# Patient Record
Sex: Female | Born: 1970 | Race: White | Hispanic: No | Marital: Married | Smoking: Former smoker
Health system: Southern US, Community
[De-identification: ages and names within clinical notes are randomized; demographics above are authoritative.]

## PROBLEM LIST (undated history)

## (undated) DIAGNOSIS — R011 Cardiac murmur, unspecified: Secondary | ICD-10-CM

## (undated) DIAGNOSIS — S8290XA Unspecified fracture of unspecified lower leg, initial encounter for closed fracture: Secondary | ICD-10-CM

## (undated) DIAGNOSIS — K458 Other specified abdominal hernia without obstruction or gangrene: Secondary | ICD-10-CM

## (undated) DIAGNOSIS — F4310 Post-traumatic stress disorder, unspecified: Secondary | ICD-10-CM

## (undated) DIAGNOSIS — K7460 Unspecified cirrhosis of liver: Secondary | ICD-10-CM

## (undated) DIAGNOSIS — K219 Gastro-esophageal reflux disease without esophagitis: Secondary | ICD-10-CM

## (undated) DIAGNOSIS — F319 Bipolar disorder, unspecified: Secondary | ICD-10-CM

## (undated) DIAGNOSIS — M419 Scoliosis, unspecified: Secondary | ICD-10-CM

## (undated) DIAGNOSIS — N189 Chronic kidney disease, unspecified: Secondary | ICD-10-CM

## (undated) DIAGNOSIS — B1910 Unspecified viral hepatitis B without hepatic coma: Secondary | ICD-10-CM

## (undated) DIAGNOSIS — F329 Major depressive disorder, single episode, unspecified: Secondary | ICD-10-CM

## (undated) DIAGNOSIS — E039 Hypothyroidism, unspecified: Secondary | ICD-10-CM

## (undated) DIAGNOSIS — K469 Unspecified abdominal hernia without obstruction or gangrene: Secondary | ICD-10-CM

## (undated) DIAGNOSIS — C801 Malignant (primary) neoplasm, unspecified: Secondary | ICD-10-CM

## (undated) DIAGNOSIS — J449 Chronic obstructive pulmonary disease, unspecified: Secondary | ICD-10-CM

## (undated) DIAGNOSIS — J45909 Unspecified asthma, uncomplicated: Secondary | ICD-10-CM

## (undated) DIAGNOSIS — Z8614 Personal history of Methicillin resistant Staphylococcus aureus infection: Secondary | ICD-10-CM

## (undated) DIAGNOSIS — J189 Pneumonia, unspecified organism: Secondary | ICD-10-CM

## (undated) DIAGNOSIS — F4001 Agoraphobia with panic disorder: Secondary | ICD-10-CM

## (undated) DIAGNOSIS — M797 Fibromyalgia: Secondary | ICD-10-CM

## (undated) DIAGNOSIS — F32A Depression, unspecified: Secondary | ICD-10-CM

## (undated) HISTORY — PX: FRACTURE SURGERY: SHX138

## (undated) HISTORY — DX: Unspecified asthma, uncomplicated: J45.909

## (undated) HISTORY — DX: Chronic obstructive pulmonary disease, unspecified: J44.9

## (undated) HISTORY — DX: Depression, unspecified: F32.A

## (undated) HISTORY — DX: Gastro-esophageal reflux disease without esophagitis: K21.9

## (undated) HISTORY — DX: Scoliosis, unspecified: M41.9

## (undated) HISTORY — DX: Major depressive disorder, single episode, unspecified: F32.9

## (undated) HISTORY — DX: Unspecified viral hepatitis B without hepatic coma: B19.10

## (undated) HISTORY — PX: HERNIA REPAIR: SHX51

## (undated) HISTORY — DX: Fibromyalgia: M79.7

## (undated) HISTORY — DX: Agoraphobia with panic disorder: F40.01

---

## 1997-10-03 HISTORY — PX: TUBAL LIGATION: SHX77

## 1997-10-03 HISTORY — PX: CHOLECYSTECTOMY: SHX55

## 1997-10-03 HISTORY — PX: APPENDECTOMY: SHX54

## 2004-11-05 ENCOUNTER — Emergency Department: Payer: Self-pay | Admitting: Emergency Medicine

## 2005-02-27 ENCOUNTER — Emergency Department: Payer: Self-pay | Admitting: Emergency Medicine

## 2005-04-01 ENCOUNTER — Other Ambulatory Visit: Payer: Self-pay

## 2005-04-01 ENCOUNTER — Emergency Department: Payer: Self-pay | Admitting: Emergency Medicine

## 2005-07-05 ENCOUNTER — Emergency Department: Payer: Self-pay | Admitting: Emergency Medicine

## 2005-09-21 ENCOUNTER — Emergency Department: Payer: Self-pay | Admitting: Emergency Medicine

## 2009-07-20 ENCOUNTER — Emergency Department: Payer: Self-pay | Admitting: Emergency Medicine

## 2009-10-28 ENCOUNTER — Emergency Department: Payer: Self-pay | Admitting: Emergency Medicine

## 2009-10-30 ENCOUNTER — Emergency Department (HOSPITAL_COMMUNITY): Admission: EM | Admit: 2009-10-30 | Discharge: 2009-10-31 | Payer: Self-pay | Admitting: Emergency Medicine

## 2010-08-05 ENCOUNTER — Emergency Department: Payer: Self-pay | Admitting: Emergency Medicine

## 2010-12-20 LAB — URINALYSIS, ROUTINE W REFLEX MICROSCOPIC
Glucose, UA: NEGATIVE mg/dL
Hgb urine dipstick: NEGATIVE
Ketones, ur: 15 mg/dL — AB
Nitrite: NEGATIVE
Protein, ur: NEGATIVE mg/dL
Urobilinogen, UA: 1 mg/dL (ref 0.0–1.0)
pH: 6 (ref 5.0–8.0)

## 2010-12-20 LAB — URINE CULTURE: Colony Count: 9000

## 2010-12-20 LAB — CBC
Platelets: 204 10*3/uL (ref 150–400)
RBC: 4.42 MIL/uL (ref 3.87–5.11)
WBC: 7.8 10*3/uL (ref 4.0–10.5)

## 2010-12-20 LAB — COMPREHENSIVE METABOLIC PANEL
ALT: 92 U/L — ABNORMAL HIGH (ref 0–35)
AST: 73 U/L — ABNORMAL HIGH (ref 0–37)
CO2: 26 mEq/L (ref 19–32)
GFR calc non Af Amer: >60 mL/min (ref >60)
Glucose, Bld: 89 mg/dL (ref 70–99)
Potassium: 4 mEq/L (ref 3.5–5.1)
Sodium: 137 mEq/L (ref 135–145)
Total Bilirubin: 0.6 mg/dL (ref 0.3–1.2)
Total Protein: 7.3 g/dL (ref 6.0–8.3)

## 2010-12-20 LAB — DIFFERENTIAL
Lymphocytes Relative: 35 % (ref 12–46)
Lymphs Abs: 2.7 10*3/uL (ref 0.7–4.0)
Monocytes Relative: 5 % (ref 3–12)
Neutro Abs: 4.5 10*3/uL (ref 1.7–7.7)
Neutrophils Relative %: 58 % (ref 43–77)

## 2011-01-30 ENCOUNTER — Emergency Department: Payer: Self-pay | Admitting: Unknown Physician Specialty

## 2011-03-22 ENCOUNTER — Emergency Department: Payer: Self-pay | Admitting: Unknown Physician Specialty

## 2011-03-31 ENCOUNTER — Emergency Department: Payer: Self-pay | Admitting: Emergency Medicine

## 2011-08-01 ENCOUNTER — Emergency Department: Payer: Self-pay | Admitting: *Deleted

## 2011-10-30 ENCOUNTER — Inpatient Hospital Stay: Payer: Self-pay | Admitting: Internal Medicine

## 2011-10-30 LAB — DRUG SCREEN, URINE
Amphetamines, Ur Screen: NEGATIVE (ref ?–1000)
Benzodiazepine, Ur Scrn: POSITIVE (ref ?–200)
Cocaine Metabolite,Ur ~~LOC~~: POSITIVE (ref ?–300)
Opiate, Ur Screen: NEGATIVE (ref ?–300)
Tricyclic, Ur Screen: NEGATIVE (ref ?–1000)

## 2011-10-30 LAB — COMPREHENSIVE METABOLIC PANEL
Albumin: 3.7 g/dL (ref 3.4–5.0)
Alkaline Phosphatase: 110 U/L (ref 50–136)
Anion Gap: 12 (ref 7–16)
BUN: 10 mg/dL (ref 7–18)
Calcium, Total: 8.8 mg/dL (ref 8.5–10.1)
Glucose: 88 mg/dL (ref 65–99)
Osmolality: 285 (ref 275–301)
SGOT(AST): 90 U/L — ABNORMAL HIGH (ref 15–37)
SGPT (ALT): 76 U/L

## 2011-10-30 LAB — CBC
HCT: 38.6 % (ref 35.0–47.0)
HGB: 13 g/dL (ref 12.0–16.0)
MCH: 32.6 pg (ref 26.0–34.0)

## 2011-10-30 LAB — URINALYSIS, COMPLETE
Blood: NEGATIVE
Glucose,UR: NEGATIVE mg/dL (ref 0–75)
Leukocyte Esterase: NEGATIVE
Nitrite: NEGATIVE
Ph: 6 (ref 4.5–8.0)
Protein: NEGATIVE

## 2011-10-30 LAB — ETHANOL
Ethanol %: 0.101 % — ABNORMAL HIGH (ref 0.000–0.080)
Ethanol: 101 mg/dL

## 2011-10-30 LAB — LIPASE, BLOOD: Lipase: 95 U/L (ref 73–393)

## 2011-10-30 LAB — ACETAMINOPHEN LEVEL: Acetaminophen: 2 ug/mL — ABNORMAL LOW

## 2011-10-31 LAB — COMPREHENSIVE METABOLIC PANEL
BUN: 10 mg/dL (ref 7–18)
Bilirubin,Total: 0.7 mg/dL (ref 0.2–1.0)
Chloride: 112 mmol/L — ABNORMAL HIGH (ref 98–107)
Creatinine: 0.75 mg/dL (ref 0.60–1.30)
EGFR (African American): >60
Potassium: 3.7 mmol/L (ref 3.5–5.1)
SGPT (ALT): 60 U/L
Total Protein: 6 g/dL — ABNORMAL LOW (ref 6.4–8.2)

## 2011-10-31 LAB — CBC WITH DIFFERENTIAL/PLATELET
Basophil %: 0.2 %
Eosinophil #: 0.1 10*3/uL (ref 0.0–0.7)
Eosinophil %: 2.4 %
Lymphocyte #: 1.5 10*3/uL (ref 1.0–3.6)
MCH: 32.9 pg (ref 26.0–34.0)
MCHC: 34.1 g/dL (ref 32.0–36.0)
MCV: 97 fL (ref 80–100)
Monocyte #: 0.3 10*3/uL (ref 0.0–0.7)
Platelet: 108 10*3/uL — ABNORMAL LOW (ref 150–440)
RBC: 3.71 10*6/uL — ABNORMAL LOW (ref 3.80–5.20)

## 2013-07-09 ENCOUNTER — Emergency Department: Payer: Self-pay | Admitting: Emergency Medicine

## 2013-07-18 DIAGNOSIS — J4520 Mild intermittent asthma, uncomplicated: Secondary | ICD-10-CM | POA: Insufficient documentation

## 2013-07-18 DIAGNOSIS — L309 Dermatitis, unspecified: Secondary | ICD-10-CM | POA: Insufficient documentation

## 2013-07-18 DIAGNOSIS — M5136 Other intervertebral disc degeneration, lumbar region: Secondary | ICD-10-CM | POA: Insufficient documentation

## 2013-07-18 DIAGNOSIS — J309 Allergic rhinitis, unspecified: Secondary | ICD-10-CM | POA: Insufficient documentation

## 2013-07-18 DIAGNOSIS — K219 Gastro-esophageal reflux disease without esophagitis: Secondary | ICD-10-CM | POA: Insufficient documentation

## 2013-08-10 DIAGNOSIS — F988 Other specified behavioral and emotional disorders with onset usually occurring in childhood and adolescence: Secondary | ICD-10-CM | POA: Insufficient documentation

## 2013-08-10 DIAGNOSIS — F410 Panic disorder [episodic paroxysmal anxiety] without agoraphobia: Secondary | ICD-10-CM | POA: Insufficient documentation

## 2013-08-10 DIAGNOSIS — F319 Bipolar disorder, unspecified: Secondary | ICD-10-CM | POA: Insufficient documentation

## 2013-08-10 DIAGNOSIS — F1910 Other psychoactive substance abuse, uncomplicated: Secondary | ICD-10-CM | POA: Insufficient documentation

## 2013-08-28 DIAGNOSIS — F1910 Other psychoactive substance abuse, uncomplicated: Secondary | ICD-10-CM | POA: Insufficient documentation

## 2013-11-17 ENCOUNTER — Emergency Department: Payer: Self-pay

## 2013-11-21 ENCOUNTER — Emergency Department: Payer: Self-pay | Admitting: Emergency Medicine

## 2013-11-21 LAB — PREGNANCY, URINE: Pregnancy Test, Urine: NEGATIVE m[IU]/mL

## 2013-11-21 LAB — DRUG SCREEN, URINE
Amphetamines, Ur Screen: NEGATIVE (ref ?–1000)
Barbiturates, Ur Screen: NEGATIVE (ref ?–200)
Benzodiazepine, Ur Scrn: POSITIVE (ref ?–200)
CANNABINOID 50 NG, UR ~~LOC~~: NEGATIVE (ref ?–50)
COCAINE METABOLITE, UR ~~LOC~~: NEGATIVE (ref ?–300)
MDMA (Ecstasy)Ur Screen: NEGATIVE (ref ?–500)
Methadone, Ur Screen: NEGATIVE (ref ?–300)
OPIATE, UR SCREEN: NEGATIVE (ref ?–300)
Phencyclidine (PCP) Ur S: NEGATIVE (ref ?–25)
Tricyclic, Ur Screen: POSITIVE (ref ?–1000)

## 2013-11-26 ENCOUNTER — Emergency Department: Payer: Self-pay | Admitting: Emergency Medicine

## 2013-11-26 LAB — URINALYSIS, COMPLETE
Bilirubin,UR: NEGATIVE
Blood: NEGATIVE
Glucose,UR: NEGATIVE mg/dL (ref 0–75)
Leukocyte Esterase: NEGATIVE
Nitrite: NEGATIVE
PH: 6 (ref 4.5–8.0)
Protein: NEGATIVE
RBC,UR: 7 /HPF (ref 0–5)
Specific Gravity: 1.019 (ref 1.003–1.030)

## 2013-11-26 LAB — COMPREHENSIVE METABOLIC PANEL
ALT: 57 U/L (ref 12–78)
ANION GAP: 3 — AB (ref 7–16)
Albumin: 3.6 g/dL (ref 3.4–5.0)
Alkaline Phosphatase: 141 U/L — ABNORMAL HIGH
BILIRUBIN TOTAL: 1.2 mg/dL — AB (ref 0.2–1.0)
BUN: 12 mg/dL (ref 7–18)
CHLORIDE: 99 mmol/L (ref 98–107)
Calcium, Total: 9 mg/dL (ref 8.5–10.1)
Co2: 31 mmol/L (ref 21–32)
Creatinine: 0.78 mg/dL (ref 0.60–1.30)
EGFR (Non-African Amer.): >60
Glucose: 113 mg/dL — ABNORMAL HIGH (ref 65–99)
Osmolality: 267 (ref 275–301)
POTASSIUM: 3 mmol/L — AB (ref 3.5–5.1)
SGOT(AST): 64 U/L — ABNORMAL HIGH (ref 15–37)
Sodium: 133 mmol/L — ABNORMAL LOW (ref 136–145)
Total Protein: 7.3 g/dL (ref 6.4–8.2)

## 2013-11-26 LAB — CBC
HCT: 35.1 % (ref 35.0–47.0)
HGB: 11.8 g/dL — ABNORMAL LOW (ref 12.0–16.0)
MCH: 31.3 pg (ref 26.0–34.0)
MCHC: 33.7 g/dL (ref 32.0–36.0)
MCV: 93 fL (ref 80–100)
PLATELETS: 97 10*3/uL — AB (ref 150–440)
RBC: 3.78 10*6/uL — AB (ref 3.80–5.20)
RDW: 13.9 % (ref 11.5–14.5)
WBC: 8.5 10*3/uL (ref 3.6–11.0)

## 2013-11-26 LAB — PREGNANCY, URINE: Pregnancy Test, Urine: NEGATIVE m[IU]/mL

## 2013-11-26 LAB — LIPASE, BLOOD: LIPASE: 61 U/L — AB (ref 73–393)

## 2013-12-02 LAB — COMPREHENSIVE METABOLIC PANEL
ANION GAP: 6 — AB (ref 7–16)
Albumin: 3.1 g/dL — ABNORMAL LOW (ref 3.4–5.0)
Alkaline Phosphatase: 146 U/L — ABNORMAL HIGH
BUN: 11 mg/dL (ref 7–18)
Bilirubin,Total: 0.3 mg/dL (ref 0.2–1.0)
CALCIUM: 8.6 mg/dL (ref 8.5–10.1)
CHLORIDE: 109 mmol/L — AB (ref 98–107)
CO2: 24 mmol/L (ref 21–32)
CREATININE: 0.78 mg/dL (ref 0.60–1.30)
EGFR (Non-African Amer.): >60
Glucose: 72 mg/dL (ref 65–99)
Osmolality: 275 (ref 275–301)
Potassium: 4.1 mmol/L (ref 3.5–5.1)
SGOT(AST): 42 U/L — ABNORMAL HIGH (ref 15–37)
SGPT (ALT): 32 U/L (ref 12–78)
SODIUM: 139 mmol/L (ref 136–145)
Total Protein: 6.6 g/dL (ref 6.4–8.2)

## 2013-12-02 LAB — URINALYSIS, COMPLETE
BACTERIA: NONE SEEN
Bilirubin,UR: NEGATIVE
Blood: NEGATIVE
Glucose,UR: NEGATIVE mg/dL (ref 0–75)
KETONE: NEGATIVE
Leukocyte Esterase: NEGATIVE
NITRITE: NEGATIVE
Ph: 5 (ref 4.5–8.0)
Protein: NEGATIVE
RBC,UR: NONE SEEN /HPF (ref 0–5)
SPECIFIC GRAVITY: 1.012 (ref 1.003–1.030)
Squamous Epithelial: 1
WBC UR: <1 /HPF (ref 0–5)

## 2013-12-02 LAB — CBC
HCT: 33.1 % — AB (ref 35.0–47.0)
HGB: 11.1 g/dL — ABNORMAL LOW (ref 12.0–16.0)
MCH: 31.7 pg (ref 26.0–34.0)
MCHC: 33.6 g/dL (ref 32.0–36.0)
MCV: 94 fL (ref 80–100)
Platelet: 112 10*3/uL — ABNORMAL LOW (ref 150–440)
RBC: 3.51 10*6/uL — ABNORMAL LOW (ref 3.80–5.20)
RDW: 14.8 % — ABNORMAL HIGH (ref 11.5–14.5)
WBC: 5.2 10*3/uL (ref 3.6–11.0)

## 2013-12-02 LAB — DRUG SCREEN, URINE

## 2013-12-02 LAB — ACETAMINOPHEN LEVEL

## 2013-12-02 LAB — SALICYLATE LEVEL: Salicylates, Serum: <1.7 mg/dL

## 2013-12-02 LAB — TSH: Thyroid Stimulating Horm: 3.83 u[IU]/mL

## 2013-12-02 LAB — ETHANOL: Ethanol: <3 mg/dL

## 2013-12-03 ENCOUNTER — Inpatient Hospital Stay: Payer: Self-pay | Admitting: Psychiatry

## 2013-12-12 DIAGNOSIS — F1320 Sedative, hypnotic or anxiolytic dependence, uncomplicated: Secondary | ICD-10-CM | POA: Insufficient documentation

## 2014-03-22 DIAGNOSIS — F1110 Opioid abuse, uncomplicated: Secondary | ICD-10-CM | POA: Insufficient documentation

## 2014-04-29 ENCOUNTER — Emergency Department: Payer: Self-pay | Admitting: Emergency Medicine

## 2014-04-29 LAB — COMPREHENSIVE METABOLIC PANEL
ALK PHOS: 153 U/L — AB
ALT: 55 U/L
ANION GAP: 6 — AB (ref 7–16)
AST: 72 U/L — AB (ref 15–37)
Albumin: 3.4 g/dL (ref 3.4–5.0)
BUN: 6 mg/dL — ABNORMAL LOW (ref 7–18)
Bilirubin,Total: 0.8 mg/dL (ref 0.2–1.0)
CALCIUM: 9 mg/dL (ref 8.5–10.1)
Chloride: 109 mmol/L — ABNORMAL HIGH (ref 98–107)
Co2: 26 mmol/L (ref 21–32)
Creatinine: 0.89 mg/dL (ref 0.60–1.30)
EGFR (Non-African Amer.): >60
GLUCOSE: 78 mg/dL (ref 65–99)
OSMOLALITY: 278 (ref 275–301)
Potassium: 3.5 mmol/L (ref 3.5–5.1)
Sodium: 141 mmol/L (ref 136–145)
TOTAL PROTEIN: 8.6 g/dL — AB (ref 6.4–8.2)

## 2014-04-29 LAB — CBC
HCT: 41.1 % (ref 35.0–47.0)
HGB: 13.5 g/dL (ref 12.0–16.0)
MCH: 30.6 pg (ref 26.0–34.0)
MCHC: 32.7 g/dL (ref 32.0–36.0)
MCV: 94 fL (ref 80–100)
PLATELETS: 161 10*3/uL (ref 150–440)
RBC: 4.4 10*6/uL (ref 3.80–5.20)
RDW: 15.3 % — ABNORMAL HIGH (ref 11.5–14.5)
WBC: 5.8 10*3/uL (ref 3.6–11.0)

## 2014-04-29 LAB — ACETAMINOPHEN LEVEL

## 2014-06-17 ENCOUNTER — Ambulatory Visit: Payer: Self-pay | Admitting: Oncology

## 2014-06-17 LAB — IRON AND TIBC
IRON BIND. CAP.(TOTAL): 445 ug/dL (ref 250–450)
IRON: 67 ug/dL (ref 50–170)
Iron Saturation: 15 %
UNBOUND IRON-BIND. CAP.: 378 ug/dL

## 2014-06-17 LAB — CBC CANCER CENTER
BASOS ABS: 0 x10 3/mm (ref 0.0–0.1)
BASOS PCT: 0.6 %
EOS ABS: 0.1 x10 3/mm (ref 0.0–0.7)
EOS PCT: 1.3 %
HCT: 44.4 % (ref 35.0–47.0)
HGB: 14.9 g/dL (ref 12.0–16.0)
LYMPHS ABS: 1.9 x10 3/mm (ref 1.0–3.6)
LYMPHS PCT: 23.2 %
MCH: 31.5 pg (ref 26.0–34.0)
MCHC: 33.4 g/dL (ref 32.0–36.0)
MCV: 94 fL (ref 80–100)
MONO ABS: 0.4 x10 3/mm (ref 0.2–0.9)
Monocyte %: 4.4 %
Neutrophil #: 5.7 x10 3/mm (ref 1.4–6.5)
Neutrophil %: 70.5 %
Platelet: 116 x10 3/mm — ABNORMAL LOW (ref 150–440)
RBC: 4.72 10*6/uL (ref 3.80–5.20)
RDW: 15.2 % — ABNORMAL HIGH (ref 11.5–14.5)
WBC: 8 x10 3/mm (ref 3.6–11.0)

## 2014-06-17 LAB — FOLATE: FOLIC ACID: 29.6 ng/mL (ref 3.1–100.0)

## 2014-06-17 LAB — FERRITIN: FERRITIN (ARMC): 97 ng/mL (ref 8–388)

## 2014-06-17 LAB — LACTATE DEHYDROGENASE: LDH: 167 U/L (ref 81–246)

## 2014-07-01 ENCOUNTER — Other Ambulatory Visit: Payer: Self-pay | Admitting: Neurosurgery

## 2014-07-01 DIAGNOSIS — M549 Dorsalgia, unspecified: Secondary | ICD-10-CM

## 2014-07-03 ENCOUNTER — Ambulatory Visit: Payer: Self-pay | Admitting: Oncology

## 2014-07-03 HISTORY — PX: OTHER SURGICAL HISTORY: SHX169

## 2014-07-11 ENCOUNTER — Other Ambulatory Visit: Payer: Self-pay

## 2014-07-11 ENCOUNTER — Inpatient Hospital Stay: Admission: RE | Admit: 2014-07-11 | Payer: Self-pay | Source: Ambulatory Visit

## 2014-07-21 ENCOUNTER — Ambulatory Visit
Admission: RE | Admit: 2014-07-21 | Discharge: 2014-07-21 | Disposition: A | Discharge status: Home / Self Care | Payer: Medicaid Other | Source: Ambulatory Visit | Attending: Neurosurgery | Admitting: Neurosurgery

## 2014-07-21 DIAGNOSIS — M549 Dorsalgia, unspecified: Secondary | ICD-10-CM

## 2014-07-26 ENCOUNTER — Emergency Department: Payer: Self-pay | Admitting: Emergency Medicine

## 2014-08-03 ENCOUNTER — Ambulatory Visit: Payer: Self-pay | Admitting: Oncology

## 2014-08-09 ENCOUNTER — Emergency Department: Payer: Self-pay | Admitting: Internal Medicine

## 2014-08-09 LAB — DRUG SCREEN, URINE
Amphetamines, Ur Screen: POSITIVE (ref ?–1000)
BARBITURATES, UR SCREEN: NEGATIVE (ref ?–200)
Benzodiazepine, Ur Scrn: POSITIVE (ref ?–200)
CANNABINOID 50 NG, UR ~~LOC~~: NEGATIVE (ref ?–50)
COCAINE METABOLITE, UR ~~LOC~~: NEGATIVE (ref ?–300)
MDMA (Ecstasy)Ur Screen: NEGATIVE (ref ?–500)
METHADONE, UR SCREEN: NEGATIVE (ref ?–300)
OPIATE, UR SCREEN: NEGATIVE (ref ?–300)
Phencyclidine (PCP) Ur S: NEGATIVE (ref ?–25)
TRICYCLIC, UR SCREEN: NEGATIVE (ref ?–1000)

## 2014-08-09 LAB — URINALYSIS, COMPLETE
BILIRUBIN, UR: NEGATIVE
BLOOD: NEGATIVE
Bacteria: NONE SEEN
Glucose,UR: NEGATIVE mg/dL (ref 0–75)
Ketone: NEGATIVE
LEUKOCYTE ESTERASE: NEGATIVE
NITRITE: NEGATIVE
PH: 6 (ref 4.5–8.0)
Protein: NEGATIVE
RBC,UR: <1 /HPF (ref 0–5)
Specific Gravity: 1.005 (ref 1.003–1.030)
Squamous Epithelial: <1
WBC UR: NONE SEEN /HPF (ref 0–5)

## 2014-08-09 LAB — COMPREHENSIVE METABOLIC PANEL
ALBUMIN: 3.5 g/dL (ref 3.4–5.0)
ALK PHOS: 161 U/L — AB
Anion Gap: 8 (ref 7–16)
BILIRUBIN TOTAL: 1.3 mg/dL — AB (ref 0.2–1.0)
BUN: 13 mg/dL (ref 7–18)
CALCIUM: 8.1 mg/dL — AB (ref 8.5–10.1)
CHLORIDE: 106 mmol/L (ref 98–107)
CO2: 26 mmol/L (ref 21–32)
CREATININE: 0.87 mg/dL (ref 0.60–1.30)
Glucose: 84 mg/dL (ref 65–99)
Osmolality: 279 (ref 275–301)
POTASSIUM: 3.5 mmol/L (ref 3.5–5.1)
SGOT(AST): 73 U/L — ABNORMAL HIGH (ref 15–37)
SGPT (ALT): 65 U/L — ABNORMAL HIGH
Sodium: 140 mmol/L (ref 136–145)
Total Protein: 7.4 g/dL (ref 6.4–8.2)

## 2014-08-09 LAB — ETHANOL

## 2014-08-09 LAB — SALICYLATE LEVEL

## 2014-08-09 LAB — ACETAMINOPHEN LEVEL: Acetaminophen: <2 ug/mL

## 2014-08-11 LAB — DRUG SCREEN, URINE
Amphetamines, Ur Screen: POSITIVE (ref ?–1000)
BENZODIAZEPINE, UR SCRN: POSITIVE (ref ?–200)
Barbiturates, Ur Screen: NEGATIVE (ref ?–200)
CANNABINOID 50 NG, UR ~~LOC~~: POSITIVE (ref ?–50)
COCAINE METABOLITE, UR ~~LOC~~: NEGATIVE (ref ?–300)
MDMA (Ecstasy)Ur Screen: NEGATIVE (ref ?–500)
Methadone, Ur Screen: NEGATIVE (ref ?–300)
Opiate, Ur Screen: NEGATIVE (ref ?–300)
PHENCYCLIDINE (PCP) UR S: NEGATIVE (ref ?–25)
TRICYCLIC, UR SCREEN: POSITIVE (ref ?–1000)

## 2014-08-11 LAB — CBC
HCT: 36.6 % (ref 35.0–47.0)
HGB: 12.3 g/dL (ref 12.0–16.0)
MCH: 31.8 pg (ref 26.0–34.0)
MCHC: 33.7 g/dL (ref 32.0–36.0)
MCV: 94 fL (ref 80–100)
PLATELETS: 104 10*3/uL — AB (ref 150–440)
RBC: 3.88 10*6/uL (ref 3.80–5.20)
RDW: 14 % (ref 11.5–14.5)
WBC: 9 10*3/uL (ref 3.6–11.0)

## 2014-08-11 LAB — COMPREHENSIVE METABOLIC PANEL
ALK PHOS: 166 U/L — AB
Albumin: 3.3 g/dL — ABNORMAL LOW (ref 3.4–5.0)
Anion Gap: 8 (ref 7–16)
BILIRUBIN TOTAL: 1.1 mg/dL — AB (ref 0.2–1.0)
BUN: 16 mg/dL (ref 7–18)
Calcium, Total: 8.5 mg/dL (ref 8.5–10.1)
Chloride: 104 mmol/L (ref 98–107)
Co2: 28 mmol/L (ref 21–32)
Creatinine: 1.03 mg/dL (ref 0.60–1.30)
EGFR (African American): >60
EGFR (Non-African Amer.): >60
GLUCOSE: 105 mg/dL — AB (ref 65–99)
Potassium: 3.6 mmol/L (ref 3.5–5.1)
SGOT(AST): 74 U/L — ABNORMAL HIGH (ref 15–37)
SGPT (ALT): 60 U/L
SODIUM: 140 mmol/L (ref 136–145)
Total Protein: 7.3 g/dL (ref 6.4–8.2)

## 2014-08-11 LAB — URINALYSIS, COMPLETE
BACTERIA: NONE SEEN
Bilirubin,UR: NEGATIVE
Blood: NEGATIVE
Glucose,UR: NEGATIVE mg/dL (ref 0–75)
Ketone: NEGATIVE
LEUKOCYTE ESTERASE: NEGATIVE
NITRITE: NEGATIVE
Ph: 5 (ref 4.5–8.0)
Protein: NEGATIVE
Specific Gravity: 1.018 (ref 1.003–1.030)
Squamous Epithelial: 3
WBC UR: <1 /HPF (ref 0–5)

## 2014-08-11 LAB — ACETAMINOPHEN LEVEL: Acetaminophen: <2 ug/mL

## 2014-08-11 LAB — SALICYLATE LEVEL: Salicylates, Serum: 1.7 mg/dL

## 2014-08-11 LAB — ETHANOL: Ethanol: <3 mg/dL

## 2014-08-12 ENCOUNTER — Inpatient Hospital Stay: Payer: Self-pay | Admitting: Psychiatry

## 2014-08-21 ENCOUNTER — Ambulatory Visit: Payer: Self-pay | Admitting: Podiatry

## 2014-08-26 LAB — CBC CANCER CENTER
BASOS PCT: 0.3 %
Basophil #: 0 x10 3/mm (ref 0.0–0.1)
EOS ABS: 0.1 x10 3/mm (ref 0.0–0.7)
Eosinophil %: 2.9 %
HCT: 36.9 % (ref 35.0–47.0)
HGB: 12.3 g/dL (ref 12.0–16.0)
LYMPHS PCT: 24.5 %
Lymphocyte #: 1.2 x10 3/mm (ref 1.0–3.6)
MCH: 31.5 pg (ref 26.0–34.0)
MCHC: 33.4 g/dL (ref 32.0–36.0)
MCV: 94 fL (ref 80–100)
MONO ABS: 0.2 x10 3/mm (ref 0.2–0.9)
MONOS PCT: 5 %
NEUTROS PCT: 67.3 %
Neutrophil #: 3.2 x10 3/mm (ref 1.4–6.5)
Platelet: 98 x10 3/mm — ABNORMAL LOW (ref 150–440)
RBC: 3.92 10*6/uL (ref 3.80–5.20)
RDW: 13.9 % (ref 11.5–14.5)
WBC: 4.7 x10 3/mm (ref 3.6–11.0)

## 2014-08-26 LAB — WOUND CULTURE

## 2014-09-02 ENCOUNTER — Ambulatory Visit: Payer: Self-pay | Admitting: Oncology

## 2014-09-05 LAB — COMPREHENSIVE METABOLIC PANEL
ALT: 52 U/L
Albumin: 3.5 g/dL (ref 3.4–5.0)
Alkaline Phosphatase: 193 U/L — ABNORMAL HIGH
Anion Gap: 7 (ref 7–16)
BILIRUBIN TOTAL: 0.9 mg/dL (ref 0.2–1.0)
BUN: 25 mg/dL — AB (ref 7–18)
CALCIUM: 8.6 mg/dL (ref 8.5–10.1)
CHLORIDE: 101 mmol/L (ref 98–107)
Co2: 30 mmol/L (ref 21–32)
Creatinine: 1.23 mg/dL (ref 0.60–1.30)
EGFR (African American): >60
GFR CALC NON AF AMER: 51 — AB
Glucose: 79 mg/dL (ref 65–99)
Osmolality: 279 (ref 275–301)
Potassium: 3.7 mmol/L (ref 3.5–5.1)
SGOT(AST): 64 U/L — ABNORMAL HIGH (ref 15–37)
SODIUM: 138 mmol/L (ref 136–145)
Total Protein: 7.9 g/dL (ref 6.4–8.2)

## 2014-09-05 LAB — URINALYSIS, COMPLETE
BACTERIA: NONE SEEN
Bilirubin,UR: NEGATIVE
Blood: NEGATIVE
GLUCOSE, UR: NEGATIVE mg/dL (ref 0–75)
KETONE: NEGATIVE
Leukocyte Esterase: NEGATIVE
Nitrite: NEGATIVE
Ph: 6 (ref 4.5–8.0)
Protein: NEGATIVE
RBC,UR: 1 /HPF (ref 0–5)
SPECIFIC GRAVITY: 1.008 (ref 1.003–1.030)
Squamous Epithelial: 1
WBC UR: NONE SEEN /HPF (ref 0–5)

## 2014-09-05 LAB — ACETAMINOPHEN LEVEL: Acetaminophen: <2 ug/mL

## 2014-09-05 LAB — ETHANOL

## 2014-09-05 LAB — CBC
HCT: 39 % (ref 35.0–47.0)
HGB: 13.2 g/dL (ref 12.0–16.0)
MCH: 31.6 pg (ref 26.0–34.0)
MCHC: 33.8 g/dL (ref 32.0–36.0)
MCV: 94 fL (ref 80–100)
Platelet: 133 10*3/uL — ABNORMAL LOW (ref 150–440)
RBC: 4.17 10*6/uL (ref 3.80–5.20)
RDW: 14.9 % — ABNORMAL HIGH (ref 11.5–14.5)
WBC: 7.6 10*3/uL (ref 3.6–11.0)

## 2014-09-05 LAB — DRUG SCREEN, URINE
AMPHETAMINES, UR SCREEN: POSITIVE (ref ?–1000)
Barbiturates, Ur Screen: NEGATIVE (ref ?–200)
Benzodiazepine, Ur Scrn: POSITIVE (ref ?–200)
Cannabinoid 50 Ng, Ur ~~LOC~~: NEGATIVE (ref ?–50)
Cocaine Metabolite,Ur ~~LOC~~: NEGATIVE (ref ?–300)
MDMA (ECSTASY) UR SCREEN: NEGATIVE (ref ?–500)
METHADONE, UR SCREEN: NEGATIVE (ref ?–300)
OPIATE, UR SCREEN: NEGATIVE (ref ?–300)
PHENCYCLIDINE (PCP) UR S: NEGATIVE (ref ?–25)
TRICYCLIC, UR SCREEN: NEGATIVE (ref ?–1000)

## 2014-09-05 LAB — TSH: THYROID STIMULATING HORM: 2.42 u[IU]/mL

## 2014-09-05 LAB — SALICYLATE LEVEL

## 2014-09-06 ENCOUNTER — Inpatient Hospital Stay: Payer: Self-pay | Admitting: Psychiatry

## 2014-10-03 DIAGNOSIS — J189 Pneumonia, unspecified organism: Secondary | ICD-10-CM

## 2014-10-03 HISTORY — DX: Pneumonia, unspecified organism: J18.9

## 2014-10-21 ENCOUNTER — Inpatient Hospital Stay: Payer: Self-pay | Admitting: Internal Medicine

## 2014-10-21 LAB — PROTIME-INR
INR: 1.4
PROTHROMBIN TIME: 17 s — AB (ref 11.5–14.7)

## 2014-10-21 LAB — COMPREHENSIVE METABOLIC PANEL
ANION GAP: 8 (ref 7–16)
AST: 364 U/L — AB (ref 15–37)
Albumin: 2.6 g/dL — ABNORMAL LOW (ref 3.4–5.0)
Alkaline Phosphatase: 156 U/L — ABNORMAL HIGH
BUN: 19 mg/dL — AB (ref 7–18)
Bilirubin,Total: 1 mg/dL (ref 0.2–1.0)
Calcium, Total: 8 mg/dL — ABNORMAL LOW (ref 8.5–10.1)
Chloride: 99 mmol/L (ref 98–107)
Co2: 29 mmol/L (ref 21–32)
Creatinine: 1.17 mg/dL (ref 0.60–1.30)
EGFR (African American): >60
EGFR (Non-African Amer.): 53 — ABNORMAL LOW
Glucose: 156 mg/dL — ABNORMAL HIGH (ref 65–99)
OSMOLALITY: 277 (ref 275–301)
POTASSIUM: 3.3 mmol/L — AB (ref 3.5–5.1)
SGPT (ALT): 280 U/L — ABNORMAL HIGH
SODIUM: 136 mmol/L (ref 136–145)
TOTAL PROTEIN: 6.3 g/dL — AB (ref 6.4–8.2)

## 2014-10-21 LAB — CBC WITH DIFFERENTIAL/PLATELET
Basophil #: 0.2 10*3/uL — ABNORMAL HIGH (ref 0.0–0.1)
Basophil %: 1.7 %
Eosinophil #: 0.1 10*3/uL (ref 0.0–0.7)
Eosinophil %: 0.9 %
HCT: 32.5 % — ABNORMAL LOW (ref 35.0–47.0)
HGB: 11.2 g/dL — ABNORMAL LOW (ref 12.0–16.0)
Lymphocyte #: 1 10*3/uL (ref 1.0–3.6)
Lymphocyte %: 10.6 %
MCH: 31.9 pg (ref 26.0–34.0)
MCHC: 34.4 g/dL (ref 32.0–36.0)
MCV: 93 fL (ref 80–100)
Monocyte #: 0.4 x10 3/mm (ref 0.2–0.9)
Monocyte %: 3.9 %
Neutrophil #: 7.6 10*3/uL — ABNORMAL HIGH (ref 1.4–6.5)
Neutrophil %: 82.9 %
Platelet: 74 10*3/uL — ABNORMAL LOW (ref 150–440)
RBC: 3.5 10*6/uL — ABNORMAL LOW (ref 3.80–5.20)
RDW: 15.2 % — ABNORMAL HIGH (ref 11.5–14.5)
WBC: 9.1 10*3/uL (ref 3.6–11.0)

## 2014-10-21 LAB — PREGNANCY, URINE: PREGNANCY TEST, URINE: NEGATIVE m[IU]/mL

## 2014-10-22 LAB — BASIC METABOLIC PANEL
Anion Gap: 6 — ABNORMAL LOW (ref 7–16)
BUN: 15 mg/dL (ref 7–18)
CREATININE: 0.99 mg/dL (ref 0.60–1.30)
Calcium, Total: 7.7 mg/dL — ABNORMAL LOW (ref 8.5–10.1)
Chloride: 102 mmol/L (ref 98–107)
Co2: 28 mmol/L (ref 21–32)
EGFR (African American): >60
GLUCOSE: 132 mg/dL — AB (ref 65–99)
OSMOLALITY: 275 (ref 275–301)
Potassium: 3.5 mmol/L (ref 3.5–5.1)
Sodium: 136 mmol/L (ref 136–145)

## 2014-10-22 LAB — CBC WITH DIFFERENTIAL/PLATELET
BASOS ABS: 0 10*3/uL (ref 0.0–0.1)
Basophil %: 0.2 %
EOS PCT: 2.1 %
Eosinophil #: 0.2 10*3/uL (ref 0.0–0.7)
HCT: 31.9 % — ABNORMAL LOW (ref 35.0–47.0)
HGB: 10.6 g/dL — ABNORMAL LOW (ref 12.0–16.0)
LYMPHS ABS: 0.9 10*3/uL — AB (ref 1.0–3.6)
LYMPHS PCT: 12.6 %
MCH: 31.2 pg (ref 26.0–34.0)
MCHC: 33.2 g/dL (ref 32.0–36.0)
MCV: 94 fL (ref 80–100)
MONOS PCT: 7.7 %
Monocyte #: 0.6 x10 3/mm (ref 0.2–0.9)
NEUTROS ABS: 5.7 10*3/uL (ref 1.4–6.5)
Neutrophil %: 77.4 %
Platelet: 81 10*3/uL — ABNORMAL LOW (ref 150–440)
RBC: 3.38 10*6/uL — AB (ref 3.80–5.20)
RDW: 15.2 % — AB (ref 11.5–14.5)
WBC: 7.4 10*3/uL (ref 3.6–11.0)

## 2014-11-05 ENCOUNTER — Emergency Department: Payer: Self-pay | Admitting: Emergency Medicine

## 2014-11-05 LAB — CBC
HCT: 34.7 % — AB (ref 35.0–47.0)
HGB: 11.5 g/dL — AB (ref 12.0–16.0)
MCH: 30.7 pg (ref 26.0–34.0)
MCHC: 33.1 g/dL (ref 32.0–36.0)
MCV: 93 fL (ref 80–100)
Platelet: 120 10*3/uL — ABNORMAL LOW (ref 150–440)
RBC: 3.73 10*6/uL — AB (ref 3.80–5.20)
RDW: 16.2 % — ABNORMAL HIGH (ref 11.5–14.5)
WBC: 7.9 10*3/uL (ref 3.6–11.0)

## 2014-11-05 LAB — COMPREHENSIVE METABOLIC PANEL
ALBUMIN: 2.3 g/dL — AB (ref 3.4–5.0)
ALK PHOS: 238 U/L — AB (ref 46–116)
AST: 173 U/L — AB (ref 15–37)
Anion Gap: 8 (ref 7–16)
BILIRUBIN TOTAL: 2.5 mg/dL — AB (ref 0.2–1.0)
BUN: 22 mg/dL — ABNORMAL HIGH (ref 7–18)
CREATININE: 1.67 mg/dL — AB (ref 0.60–1.30)
Calcium, Total: 8.1 mg/dL — ABNORMAL LOW (ref 8.5–10.1)
Chloride: 100 mmol/L (ref 98–107)
Co2: 29 mmol/L (ref 21–32)
EGFR (Non-African Amer.): 35 — ABNORMAL LOW
GFR CALC AF AMER: 43 — AB
GLUCOSE: 168 mg/dL — AB (ref 65–99)
Osmolality: 281 (ref 275–301)
Potassium: 3 mmol/L — ABNORMAL LOW (ref 3.5–5.1)
SGPT (ALT): 144 U/L — ABNORMAL HIGH (ref 14–63)
Sodium: 137 mmol/L (ref 136–145)
TOTAL PROTEIN: 6.7 g/dL (ref 6.4–8.2)

## 2014-11-05 LAB — ETHANOL: Ethanol: <3 mg/dL

## 2014-11-06 ENCOUNTER — Emergency Department: Payer: Self-pay | Admitting: Emergency Medicine

## 2014-12-15 ENCOUNTER — Emergency Department: Payer: Self-pay | Admitting: Emergency Medicine

## 2015-01-24 NOTE — H&P (Signed)
PATIENT NAME:  [name], [name] MR#:  [id number] DATE OF BIRTH:  1971-02-15  DATE OF ADMISSION:  12/03/2013  IDENTIFYING INFORMATION AND CHIEF COMPLAINT:  This is a 44 year old woman who presented to the Emergency Room under involuntary commitment paperwork with altered mental status and reports that she had overdosed on her medicine.   CHIEF COMPLAINT:  "I didn't overdose."   HISTORY OF PRESENT ILLNESS:  Information obtained from the patient and the chart.  The patient evidently presented to the Emergency Room with altered mental status, confused, somewhat disorganized.  Her family had reported that she had overdosed on her prescription medicine.  The patient tells me today that no such thing had happened.  She tells me that she suffers from "hypoglycemia" and that she had not been eating well recently so her blood sugar got low.  She denies to me absolutely that she used any excessive amount of any of her prescription medications.  The patient states that her mood is feeling anxious, but not particularly different than usual.  She has some chronic sleep problems.  Her largest however is her chronic pain.  She tells me that her usual provider, who apparently is a Designer, jewellery, had recently told her that he would no longer prescribe narcotics for her.  The patient has not located a new provider yet.  She tells me that she has chronic pain, especially in her lower back.  Denies suicidal ideation.  Denies any psychotic symptoms.  Does say that she feels like she is under quite a bit of stress.  She says that she just recently relocated to this area from [place].  She is living with her mother-in-law.  Her husband is currently in prison.  The patient feels very isolated.   PAST PSYCHIATRIC HISTORY:  She tells me that she has had psychiatric treatment since she was a young child.  She has had large number of hospitalizations "too many to count" although she says her last one was in 2005.  She says that she  has been diagnosed with bipolar disorder.  Her descriptions of her mood changes are not easy to assess right now just from her history.  She is on Seroquel chronically and says that that medicine has been extremely effective for her.  She has been on other medicines in the past and she particularly remembers Geodon as being ineffective.  She is also maintained on chronic alprazolam.  It is not clear whether she is going to be cut off of that medicine as well, but I suspect that is probably the case.  She says she had a history of a suicide attempt when she was a teenager, but none since then.  Denies any overdoses.   PAST MEDICAL HISTORY:  The patient has chronic pain in her back.  Describes herself as having "degenerative dis, disease."  Also is on medicine for blood pressure and allergies and takes an estrogen supplement.   SOCIAL HISTORY:  She says that she is married, but her husband is currently in prison.  She does not work and she gets disability.  She has adult children.  She is living with her stepmother and indicates that she does not think it is a good situation.  She says she has had multiple deaths in her family over the last year.   FAMILY HISTORY:  Says that many members of her family, especially women on her mother's side have had mental health problems.   CURRENT MEDICATIONS:  The exact details seem to  be a little unclear, but as far as I can tell it is Xanax 1 mg 3 times a day, Zyrtec 10 mg per day, Premarin 0.625 mg per day, Flexeril 10 mg 3 times a day, vitamin D2 50,000 units twice a week, Lasix 20 mg per day, Neurontin 300 mg 3 times a day, lidocaine patch to the back during the day, omeprazole 20 mg per day.  She says she takes Seroquel 800 mg at night.  The intake note indicated the dose was 300 mg twice a day.  She also had until recently been prescribed 10 mg Percocet 4 times a day.  That prescription has not been filled in about a month from what I can determine.   ALLERGIES:   SULFA DRUGS AND TRAMADOL.   REVIEW OF SYSTEMS:   Chiefly pain in her back.  Also, anxiety and some irritability about being in the hospital.  Chronic fatigue.  Some restlessness.  Denies suicidal ideation.  Denies homicidal ideation.  Denies any hallucinations.  Does not report racing thoughts or disorganized thinking.  The rest of her review of systems is largely negative on any significant manner.   MENTAL STATUS EXAMINATION:  Disheveled woman who looks older than her stated age.  Cooperative with the interview.  Eye contact intermittent.  Psychomotor activity sluggish.  Speech normal rate, tone and volume.  Affect mildly anxious, but reactive.  Mood stated as being nervous.  Thoughts are lucid without obvious loosening of associations, although at times she seems to have some slow thought blocking and to lose her train of thought.  Denies auditory or visual hallucinations.  Denies suicidal or homicidal ideation.  The patient's short-term memory two out of three at three minutes.  Long-term memory grossly intact.  Alert and oriented x 4.  Judgment and insight seem impaired.  Normal fund of knowledge.   PHYSICAL EXAMINATION:   NEUROLOGIC:  Moderately overweight woman.  Disheveled.  No acute skin lesions identified.  Pupils equal and reactive.  Face seems to perhaps be slightly lopsided with some weakness on the right side, but it is not clear.  Not obviously impairing.   NECK AND BACK:  Nontender to light palpation.  Full range of motion at all extremities.  Normal gait.  Strength and reflexes normal and symmetric throughout.  Cranial nerves symmetric and normal.  LUNGS:  Clear.  No wheezes.  HEART:  Regular rate and rhythm.  ABDOMEN:  Soft, nontender, normal bowel sounds.  CURRENT VITAL SIGNS:  Show a temperature of 98, pulse 90, respirations 18, blood pressure 128/88.   LABORATORY RESULTS:  Chemistry panel includes a urine drug screen positive for benzodiazepines and tricyclics.  Negative for  opiates.  TSH normal at 3.83.  Alcohol undetected.  AST slightly elevated at 42.  Albumin slightly low at 3.1.  Chloride elevated at 109.  Glucose 72.  Hematology panel shows hematocrit low at 31.1, platelet count low at 112.  White count is normal.  Urinalysis does not appear to show signs of infection.  Acetaminophen and salicylates negative.   ASSESSMENT:  This is a 44 year old woman who presents with allegations from her family that she has been overdosing on her medicine.  She denies it.  She denies any suicidal or homicidal ideation.  She appears to be alert and in a normal intelligence range and able to understand basic information and treatment discussion.  I note from the computer records that she has had several visits to our Emergency Room recently which seem to  be mostly focused around wanting to get more pain medication.  At this point, the patient is completely denying any acute psychiatric symptoms that would require hospitalization.  Does not appear to be going through any detox.  I suspect she may be going through some opiate withdrawal from being off of her prescription narcotics.  It is certainly possible that she may have overused her benzodiazepines, but right now, I do not have any proof of it.   TREATMENT PLAN:  Monitor vital signs. Monitor mental status to look for any signs of withdrawal or delirium.  Continue to evaluate for dangerousness.  The patient needs a psychiatric provider in the area.  We will re-evaluate tomorrow to see if she needs to continue in the hospital.  For now, I have put her on a half dose of her Xanax under the assumption that she probably does not have an outpatient provider anymore and she probably be detoxing off this.  We are not giving her any narcotics right now.   DIAGNOSIS, PRINCIPAL AND PRIMARY:  AXIS I:  Delirium of unclear etiology, possibly medication related.   SECONDARY DIAGNOSES: AXIS I:  Bipolar disorder, not otherwise specified.  AXIS II:   Deferred.  AXIS III:  Chronic pain, chronic obstructive pulmonary disease, hypertension.  AXIS IV:  Severe from social isolation and lack of resources.  AXIS V:  Functioning at time of evaluation 15.     ____________________________ Gonzella Lex, MD jtc:ea D: 12/03/2013 23:13:58 ET T: 12/04/2013 00:53:53 ET JOB#: [id number]  cc: Gonzella Lex, MD, <Dictator> Gonzella Lex MD ELECTRONICALLY SIGNED 12/05/2013 0:39

## 2015-01-24 NOTE — H&P (Signed)
PATIENT NAME:  [name], [name] MR#:  [id number] DATE OF BIRTH:  1971/03/16  DATE OF ADMISSION:  09/06/2014  PLACE OF DICTATION: [place], [place], [place].  SEX: Female.  RACE: White  AGE: 44 years  INITIAL PSYCHIATRIC EVALUATION  IDENTIFYING INFORMATION: The patient is a 44 year old white female, not employed for more than 20 years, in and out of hospitals for mental illness many times.. Patient married for the second time and has been living with her mother-in-law since her husband is in jail for several months. The patient comes back for re-admission to [facility] with the chief complaint, "I went to my psychiatrist for followup and I told him I was depressed and he put me on Zoloft 100 mg and I became allergic and look at my hands. They are red, and I came here for help."  HISTORY OF PRESENT ILLNESS: The patient reports that she was last discharged on 08/18/2014 after being stabilized for her bipolar disorder and discharge medications were as follows: Haldol 2 mg 3 times a day for mania, Cogentin 0.5 mg twice a day for EPS, gabapentin 600 mg 3 times a day for chronic pain, Lasix 40 mg daily for edema, ProAir 2 puffs q.4 to 6 hours for shortness of breath, omeprazole 40 mg daily for GERD, Premarin 0.625 mg daily for hormone replacement, vitamin D2 at 50,000 units weekly for vitamin D deficiency and Flonase 2 sprays. The patient reports that she has been compliant, but she felt low and down, depressed about her husband and when she told Dr. Rosine Door, he put her on Zoloft, which she feels caused a reaction and showed her hands, which appeared slightly red but do not appear to have any major reaction.  PAST PSYCHIATRIC HISTORY: Inpatient hold on psychiatry on many occasions to many hospitals and, in fact, her last inpatient hospitalization was to [facility]. The patient reports that she was diagnosed with ADHD as a child and she was on Ritalin and  had been on many medications since then. Currently, she is being followed by Dr. Rosine Door and last appointment was a few weeks ago and next appointment is to be made after her discharge. In addition, according to information obtained from the charts, the patient has been diagnosed with bipolar disorder, opiate and benzodiazepine abuse, borderline histrionic features, chronic pain, hepatitis C, chronic anemia, vitamin D deficiency.  FAMILY HISTORY OF MENTAL ILLNESS: Multiple family members have mental illness and depression. All the females in the family have mental illness and depression and suicide attempt  in the females in the family.  SOCIAL HISTORY: The patient is currently living with her mother-in-law. Her husband is in jail. Born in [place], graduated from high school. No college.   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  MARRIAGES: Married second time; has 3 children who are 45 years old, 49 years old, 45 years old. Has joint custody of the 45 year old and gets to see all of her children.  ALCOHOL AND DRUGS: Admits that she drinks alcohol. Admits that she did use all of these drugs as stated above and currently she has not been using and has been stable.   The patient gets a disability for mental illness and chronic back pain. Husband is still waiting for  a court hearing about him going to prison.  MEDICAL HISTORY: The patient has scoliosis  and degenerative disk disease. She has been on disability for hip problems, back problems and chronic pain. She is positive for hepatitis C and history of GERD and vitamin D deficiency. No major surgeries but scheduled for back surgery. No major injuries. No history of motor vehicle accident. Never been unconscious. Being followed by PCP at [facility]. Last appointment for her PAP was a few days ago. Next appointment will be  made.  ALLERGIES: SULFA DRUGS AND PATIENT REPORTS CURRENTLY ZOLOFT WHICH NEEDS TO BE CHECKED OUT.  PHYSICAL EXAMINATION:  VITAL SIGNS: Blood pressure 128/80 mmHg, heart rate is 84 per minute and regular, respiratory rate 18 per minute and regular, temperature 98.2. HEENT: Head is normocephalic, atraumatic. Eyes: PERRLA, fundi are benign. NECK: Supple without any organomegaly, lymphadenopathy, thyromegaly. CHEST: Normal expansion.  BACK: Scoliosis. HEART: Normal S1 without any murmurs or rubs. ABDOMEN: Soft, no organomegaly. Bowel sounds heard. PELVIC: Deferred. RECTAL: Deferred. NEUROLOGIC: The patient is seen lying in bed because she is feeling drowsy. She is ambulatory. Cranial nerves II through XII grossly intact and normal. DTRs 2+ and normal.   MENTAL STATUS EXAMINATION: The patient is dressed in hospital clothes. Alert and oriented to place, person and time, fully aware of the situation which brought her here to [facility]. Fairly cooperative and calm, pleasant and repeatedly asked the undersigned, "Can I go home? Look, I'm no more allergic to anything." No agitation. Denies feeling depressed. Denies feeling hopeless or helpless. Denies feeling worthless or useless. Denies having any ideas or plans to hurt himself or others. No psychosis. Denies auditory or visual hallucinations, delusions, or paranoid thinking. Cognition intact. General fund of knowledge and information fair for level of education. Memory is intact. Attention and concentration appear to be rather poor and she was not able to focus during  the interview. Insight and judgment guarded. Impulse control is poor.  IMPRESSION:  AXIS I: Bipolar disorder, unspecified. Appears to be stable currently at this time and she has mild hypomania which is probably her baseline. Polysubstance abuse as stated above with benzodiazepine use disorder, opiate use disorder, cannabis use disorder, cocaine use disorder, amphetamine use disorder. Nicotine  dependence. AXIS II: Deferred. AXIS III: Chronic back pain and hip pain secondary to degenerative osteoarthritis and scoliosis. AXIS IV: Severe. Long history of mental illness with poor coping skills. AXIS V: Global Assessment of Functioning of 24.  PLAN: The patient is admitted to [facility] for close observation and evaluation. She will be restarted on Zoloft and will be started on the rest of her medications. During the stay in the hospital, she will be given milieu therapy and supportive counsel. She will take part in individual and group therapy where coping skills and dealing with mental illness and stressors of life because her husband is in jail will be discussed and she will be observed for the Zoloft allergy and her hands will be observed. At the time of discharge, the patient will establish the appropriate followup appointments within the community.    ____________________________ Wallace Cullens. Franchot Mimes, MD skc:lm D: 09/06/2014 16:49:52 ET T: 09/06/2014 19:45:05 ET JOB#: [id number]  cc: Arlyn Leak K. Franchot Mimes, MD, <Dictator> Dewain Penning MD ELECTRONICALLY SIGNED 09/07/2014 14:57

## 2015-01-24 NOTE — Consult Note (Signed)
PATIENT NAME:  [name], [name] MR#:  [id number] DATE OF BIRTH:  May 09, 1971  DATE OF CONSULTATION:  08/12/2014  REFERRING PHYSICIAN:   CONSULTING PHYSICIAN:  Gonzella Lex, MD  IDENTIFYING INFORMATION AND REASON FOR CONSULTATION: A 44 year old woman with a history of bipolar disorder who came into the hospital on involuntary commitment reporting that she is incoherent and has mental status changes.    CHIEF COMPLAINT: "She said I could go home."   HISTORY OF PRESENT ILLNESS: Information obtained from the patient and the chart. Involuntary commitment paperwork states that she had been unconscious, difficult to arouse. Other details are unclear. Information obtained from the patient is somewhat incoherent. She talks about how she was having a fight with her mother-in-law. It is not clear what then transpired. Apparently somebody eventually did call 911. The patient tells me that her mood has been nervous. She says that her sleep is divided up through the day but indicates that it has not really been different than usual. She denies that she has had any suicidal or homicidal ideation. Denies that she has been having any hallucinations. She claims at one point that she takes her medicine correctly, but then admits that some times she is inconsistent about the Seroquel. She denies that she has been overdosing or misusing any of her prescription medicines and denies that she has been using any street drugs.   PAST PSYCHIATRIC HISTORY: She has 1 prior admission to our hospital earlier this year. Long-standing history of bipolar disorder. Currently gets followed up by Dr. Rosine Door. Has previous hospitalizations in the past in other states as well. At least 1 prior suicide attempt. Feels like the Seroquel that she is taking now has been helpful. She is chronically maintained on Xanax as well. Seems to have a chronic pain issue of unclear type as well.   SOCIAL HISTORY: The patient is living with her  mother-in-law. Her husband is in prison. The patient does not seem like she works outside the home, sounds like her life is fairly constricted.   PAST MEDICAL HISTORY: She has some kind of injury to her right leg that has put it in a cast right now. She has chronic allergies. She takes Lasix, although I do not think she has heart failure. She has COPD, chronic bronchitis.   FAMILY HISTORY: Says everyone in her mother's side of family has mental health problems of bipolar or depressed type.   SUBSTANCE ABUSE HISTORY: Denies that she drinks, denies that she abuses any street drugs. Says that she has been using all of her medications appropriately.   CURRENT MEDICATIONS: She has been taking Xanax, from what I can tell at a dose of 2 mg 3 times a day, albuterol 30 mg twice a day, gabapentin 300 mg twice a day, Zyrtec 10 mg a day, Lasix 40 mg a day, albuterol inhaler p.r.n., Zoloft 50 mg a day, Premarin 0.625 mg a day, Flonase nasal inhaler regularly, Patanol eyedrops once a day, omeprazole 40 mg a day.   ALLERGIES: SULFA DRUGS AND TRAMADOL.   REVIEW OF SYSTEMS: The patient is not a very good historian about this. Denies suicidal or homicidal ideation. Denies hallucinations. Not reporting feeling depressed. Says that her sleep has been okay. She does not have specific complaints about her pain. The rest of the review of systems is noncontributory.   MENTAL STATUS EXAMINATION: Disheveled, poorly groomed woman, looks her stated age. She is trying to be cooperative with the interview, but has trouble focusing.  Eye contact poor. Psychomotor activity fidgety, looking around the room constantly throughout the interview. Speech is quiet, but very rapid and pressured at times, especially when given an open ended question. Sometimes slurred and often incoherent. Affect seems confused more than anything. Mood is stated as being all right. Thoughts are disorganized. I could structure her around simple questions, but  could not ever get a really coherent version of the recent history. Does not appear to have obvious delusions. Denies hallucinations. Denies suicidal or homicidal ideation. She could remember 3/3 objects immediately, 3/3 at three minutes. She was alert and oriented x4. Judgment and insight seems to be at least acutely impaired. Normal intelligence.   DIAGNOSTIC DATA: Laboratory results: Chemistry panel slightly elevated. Glucose slightly elevated. Bilirubin and alkaline phosphatase elevated at 166 and AST up at 74. Drug screen is positive for tricyclics, amphetamines, cannabis, and benzodiazepines. CBC: Slightly low platelet count of 104,000, otherwise unremarkable. Urinalysis noninfected. Salicylates and acetaminophen negative.   Head CT negative.   VITAL SIGNS: Blood pressure is currently 118/87, respirations 18, pulse 84, and temperature 99.   ASSESSMENT: A 44 year old woman with a history of bipolar disorder, chronically maintained on several controlled substances, comes into the hospital for the second time this week in a disorganized, incoherent state. Probably having a mixed bipolar episode, possibly also overusing or misusing her medication as well. Seems to be possibly intoxicated as well as pressured in her speech. I think that 2 commitments in a few days as well as her currently incoherent state make it reasonable to admit her to the hospital for stabilization.   TREATMENT PLAN: Admit to psychiatry. Continue medications, but I am going to hold off on the Adderall and the pain medicine for now. Xanax will be p.r.n. Continue the Seroquel and the Zoloft. Primary team downstairs can work with her on further medicine changes. Suicide and fall precautions in place.   DIAGNOSIS, PRINCIPAL AND PRIMARY:  AXIS I: Bipolar disorder, manic, mild.   SECONDARY DIAGNOSES: AXIS I:  1.  Benzodiazepine intoxication. 2.  Cannabis abuse.  AXIS II: Deferred.  AXIS III: Chronic pain.    ____________________________ Gonzella Lex, MD jtc:sb D: 08/12/2014 10:58:49 ET T: 08/12/2014 11:35:28 ET JOB#: [id number]  cc: Gonzella Lex, MD, <Dictator> Gonzella Lex MD ELECTRONICALLY SIGNED 08/18/2014 17:04

## 2015-01-24 NOTE — Discharge Summary (Signed)
PATIENT NAME:  [name], [name] MR#:  [id number] DATE OF BIRTH:  March 07, 1971  DATE OF ADMISSION:  12/03/2013 DATE OF DISCHARGE: 12/05/2013   HOSPITAL COURSE: See dictated history and physical for details of admission. A 44 year old woman with a history of mood instability, possible diagnosis of bipolar disorder and also with a history of chronic pain and likely substance dependence, partially iatrogenic. She came into the hospital with an allegation from her family that she had overdosed on medicine. It was documented, when she came in, that she was more groggy and seemed to be sedated, although she did not require intubation or medical admission. The patient has consistently, to me, denied that she overdosed on her medicine. She insisted that she had been taking her medication normally as required and insisted that she did not require hospital treatment. She has denied suicidal ideation. Initially, she was quite irate and irritable, particularly when I brought up the suggestion that we try cutting back on some of her narcotics, which did seem to be excessive in their use. She also told us that her previous provider of pain medication had fired her from their clinic and that she had been off of her medicine for several weeks. Her drug screen was negative for opiates. Her story appeared, to me, to change later in her hospital stay when she claimed that she did have a pain doctor she had already seen, who was prescribing her narcotics. I could not confirm this and I still think that she would be better off not taking these regular doses of narcotic pain medicines because of the risk of misuse and questionable utility. At the time of discharge, she is not showing clear distress either physically or emotionally related either to her pain or to withdrawal. She still reports having chronic back pain, but appears to be ambulatory and functional. She insists that she has a pain clinic doctor that she is going to be  following up with. The patient will be following up for outpatient psychiatric care with [facility] locally. Medications were adjusted slightly, although we continued her on her quetiapine as her primary current psychiatric medication. I have gotten her off of standing benzodiazepines and also discontinued her narcotics. She still getting her lidocaine patch.   MEDICATIONS AT DISCHARGE: Gabapentin 300 mg 3 times a day, Zyrtec 10 mg once per day, quetiapine 300 mg twice a day, lidocaine 5% patch to back once a day during the daytime, remove at night, Lasix 20 mg once a day, Flexeril 10 mg every 8 hours as needed for muscle spasm; I have given her minimal amounts of this to try and mitigate any abuse, Nasonex nasal spray which she was using previously, she can continue once a day, (Dictation Anomaly) <<cromalynMISSING TEXT>> eye drops also which she had been taking previously she can use as needed for her itching eyes, conjugated estrogen 0.625 mg once a day, vitamin D2 supplement 50,000 units twice a week and pantoprazole 40 mg once a day.   MENTAL STATUS EXAM AT DISCHARGE: Compared to admission, her hygiene and tidiness are much improved. Normally dressed woman, looks her stated age. Cooperative with the interview. Good eye contact. Psychomotor activity appears to be fairly normal. She is able to ambulate at a normal rate and able to sit still in a chair and hold a conversation without obvious distress. Speech normal rate, tone and volume. Affect reactive, euthymic, appropriate, not rageful or panicky. Mood stated as being okay. Thoughts are lucid without loosening of associations or  obvious delusions. Denies auditory or visual hallucinations. Denies suicidal or homicidal ideation. Shows a slight improvement in her insight, but not a great deal, especially concerning her use of prescription substances. The patient does have a place to live and will be going back to stay with her mother and feels comfortable with this.  Judgment and insight improved over admission, although I think she still does not quite get the problems with the substance use. Her intelligence and fund of knowledge are normal. Short and long-term memory grossly intact judged by conversation. Alert and oriented x 4.   LABORATORY RESULTS ON ADMISSION: Urinalysis unremarkable. Drug screen positive for benzodiazepines and tricyclics, which is probably the Seroquel, but notably negative for opiates. EKG no acute finding. TSH normal at 3.83. Salicylates undetected. CBC with chronically low hematocrit 33.1, hemoglobin low 11.1, platelet count low at 112, white count normal. Alcohol level negative. Chloride elevated at 109. AST elevated at 42. Albumin low at 3.1. Acetaminophen negative.   DISPOSITION: She is being discharged back to her mother-in-law's house and will be assigned follow up at [facility]. She claims she has a pain doctor set up to see.   DIAGNOSIS, PRINCIPAL AND PRIMARY:  AXIS I: Bipolar disorder, not otherwise specified.  SECONDARY DIAGNOSES:  AXIS I: Probable opiate and benzodiazepine abuse.  AXIS II: Borderline and histrionic features.  AXIS III: Chronic pain, reported history of being positive for hepatitis C, gastric reflux symptoms, hormone replacement, vitamin D deficiency, chronic anemia.  AXIS IV: Severe from her husband being in jail, not working, limited income, decreased social support.  AXIS V: Functioning at time of discharge 49.   ____________________________ Gonzella Lex, MD jtc:aw D: 12/05/2013 11:45:23 ET T: 12/05/2013 12:25:34 ET JOB#: [id number]  cc: Gonzella Lex, MD, <Dictator> Gonzella Lex MD ELECTRONICALLY SIGNED 12/05/2013 17:56

## 2015-01-24 NOTE — Discharge Summary (Signed)
PATIENT NAME:  [name], [name] MR#:  [id number] DATE OF BIRTH:  1971-09-24  DATE OF ADMISSION:  08/12/2014 DATE OF DISCHARGE:  08/18/2014  IDENTIFYING INFORMATION: The patient is a 44 year old married Caucasian female disabled due to issues with chronic pain and mental illness.   CHIEF COMPLAINT: "I have not slept in 3 days."   DISCHARGE DIAGNOSES: AXIS I: 1.  Unspecified bipolar disorder (rule out amphetamine-induced bipolar disorder versus bipolar type disorder).  2.  Benzodiazepine use disorder.  3.  Opiate use disorder.  4.  Cannabis use disorder.  5.  Cocaine use disorder.  6.  Rule out amphetamine use disorder.   AXIS II:  History of borderline personality disorder.   AXIS III:   1.  Chronic pain.   2.  Gastroesophageal reflux disease.   3.  Hepatitis C.  4.  Recent fracture of her right leg.   DISCHARGE MEDICATIONS:  Haldol 2 mg p.o. 3 times a day for mania, benztropine 0.5 mg b.i.d. for EPS prevention, gabapentin 600 mg 3 times a day for chronic pain, Lasix 40 mg p.o. daily for edema, ProAir 2 puffs q. 4-6 hours as needed for shortness of breath, omeprazole 40 mg p.o. daily for GERD, Premarin 0.625 mg p.o. daily for hormonal replacement, vitamin D2 50,000 units q. weekly for vitamin D deficiency, Flonase 2 sprays once daily for allergies, nicotine inhaler 1 inhalation every 1-2 hours as needed for nicotine cravings.   HOSPITAL COURSE: This patient presented to our Emergency Department on 08/12/2014 via EMS.  It was reported to the Emergency Department staff that the police were contacted due to a disturbance in the residence.  There the patient was found on the floor incoherent upon police arrival.  She was not able to communicate to the triage specialist after her arrival to the Emergency Department. She was incoherent at police arrival. In the Emergency Department she was described as excessively talkative and responding to internal stimuli. She appeared paranoid, indicating  that everybody was talking about her and laughing.  Her conversation was scattered and disorganized. She denied abusing alcohol or illicit substances, however her toxicology screen was positive for marijuana among other substances. The patient was admitted to the behavioral health unit for further assessment and stabilization. Upon review of her medical chart it was noted that the patient has been in our Emergency Department on multiple occasions, over the last 6 months the patient has been in our ER 3 times. The patient has had prior presentations to our Emergency Department where she appeared overly intoxicated. She is prescribed with multiple controlled substances by her psychiatrist and by apparently a pain clinic. Collateral information was obtained from the patient's mother-in-law whom she lives with, who reported that the patient does abuse her medications. During her stay in the behavioral health unit the patient was not started on opiates as her toxicology screen was negative. We were able to confirm that the patient was seen at a pain clinic that has closed down and the patient is not receiving a prescription for opiates through a physician at this point in time. She has been referred to a clinic in [place], but has not been yet assessed. Therefore the patient was not started on opiates.  In terms of benzodiazepines the was no restarted on alprazolam, which is prescribed to her by her psychiatrist, instead the patient was started on a Librium taper. The patient was also prescribed with amphetamines for what she claims is a history of ADHD, this medication was  also discontinued as the patient appeared to have some manic symptoms and it was unclear whether these were primary or the result of the use of amphetamines.  For stabilization the patient was started on haloperidol 2 mg p.o. b.i.d., this dose was eventually titrated up to 2 mg by mouth 3 times a day. She was also started on benztropine to prevent  EPS symptomatology. The patient was started on haloperidol as she did not agree with any other medications and she stated that she had been tried on many mood stabilizers in the past and was not in agreement with a second trial of those agents. The patient did improve on the treatment on haloperidol. Her speech was not as pressured and slurred. The patient was seen more out of her room.  On the day of the discharge the patient denied suicidality, homicidality, or psychosis. She denied any major problems with sleep, appetite, energy, or concentration. She did not report any side effects from her medication. She did complain of pain which has been chronic. On examination the patient no longer was having pressured speech. Her thought process was more linear. The patient also was not restless and did not display psychomotor agitation.   MENTAL STATUS EXAMINATION AT THE TIME OF DISCHARGE: Appearance, the patient is disheveled. Appears her stated age. She is wearing a cast on her right leg. Behavior, she was calm, pleasant, and cooperative. Psychomotor activity within normal limits. Eye contact within normal limits. Speech had regular tone, volume, and rate. Thought process is linear, no longer having tangential or circumstantial speech. Thought processes is linear. Thought content negative for suicidality or homicidality. Perception is negative for psychosis. Mood is euthymic. Affect is reactive. Insight and judgment are limited.  Cognitive examination, the patient was alert and oriented to person, place, time, and situation. Fund of knowledge appears to be average for her level of education and attention and concentration appeared impaired on admission, but much improved on discharge.   LABORATORY RESULTS:  BUN 16, creatinine 1.03, sodium 140, potassium 3.6, calcium 8.6. Alcohol level at admission was below detection limit. AST 74, ALT 60. Urine toxicology screen was positive for amphetamines, benzodiazepines,  cannabis, and tricyclic antidepressants. WBCs are 9.0, hemoglobin 12.3, hematocrit 36.6, platelet count is slightly decreased at 104,000.  UA was clear. Acetaminophen level and salicylate level were below detection limit. QTc was 497 on arrival which is increased.   DISCHARGE DISPOSITION: The patient will return to the home of her mother in law where she has been living.   DISCHARGE FOLLOWUP:  The patient will continue to follow up with Dr. Wonda Amis at the [facility] office on November 19 at 11:00 a.m., phone number is [phone].    ____________________________ Hildred Priest, MD ahg:bu D: 08/18/2014 15:48:31 ET T: 08/18/2014 16:27:29 ET JOB#: [id number]  cc: Hildred Priest, MD, <Dictator> Rhodia Albright MD ELECTRONICALLY SIGNED 08/19/2014 13:25
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
## 2015-01-25 NOTE — H&P (Signed)
PATIENT NAME:  [name], [name] MR#:  [id number] DATE OF BIRTH:  04-03-1971  DATE OF ADMISSION:  10/30/2011  CHIEF COMPLAINT: Intentional overdose.   HISTORY OF PRESENT ILLNESS: Ms. Zanders is a 44 year old woman with history of schizoaffective disorder, bipolar type, who is maintained on Seroquel 1000 mg at bedtime. She also has a history of alcohol abuse. Last night she tells me that she relapsed on alcohol and cocaine. After getting in an argument with her grandmother who lives with her, she overdosed on Seroquel. She believes she took ten 400 mg long-acting Seroquels and went to sleep. She denies any current suicidal ideation, homicidal ideation, intent, or plan. She denies any recent depression but says that she is frequently at odds with her grandmother. She does feel safe at home and is not fearful of anybody in her home.   REVIEW OF SYSTEMS: She denies any fever, weakness, or fatigue. No weight loss or weight gain. No diplopia, blurry vision, eye pain, or floaters. No rhinorrhea, epistaxis, or nasal discharge. No tinnitus or hearing changes. No dysphagia or odynophagia. No wheezing, cough, or dyspnea. No chest pain, tachycardia, or palpitations. No orthopnea or PND. No nausea, vomiting, diarrhea, or constipation. No melena, hematochezia, or hematemesis. No dysuria, hematuria, nocturia, or incontinence. No joint pain or stiffness. No redness or swelling of any joints. No rashes, wounds, pruritus, or other skin changes. No headaches, weakness, numbness or tingling. No heat or cold intolerance. No polyuria or polydipsia. No easy bruising, easy bleeding, anemia, or swollen glands.   PAST MEDICAL HISTORY:  1. Schizoaffective disorder. 2. Endometriosis. 3. Asthma. 4. ADHD.   PAST SURGICAL HISTORY: 1. Appendectomy. 2. Cholecystectomy.   FAMILY HISTORY: Significant only for various psychiatric diagnoses including depression and bipolar.   SOCIAL HISTORY: She occasionally smokes cigarettes and  drinks alcohol as described in the history of present illness. She is not currently working. She lives with her grandmother and husband.   CODE STATUS: She is a FULL CODE. Contact person is her husband.   ALLERGIES: She has no known drug allergies.   HOME MEDICATIONS:  1. Zantac 150 mg p.o. b.i.d.  2. Seroquel 1000 mg p.o. at bedtime.  3. Norco 325/5 mg p.r.n.  4. Amlodipine 5 mg.   PHYSICAL EXAMINATION:   VITAL SIGNS ON ADMISSION: Pulse 88, respiratory rate 18, blood pressure 89/57. She has 100% on room air   GENERAL: She is well developed, well nourished, and in no acute distress. She does appear sleepy but awakens with conversation.   HEENT: Conjunctivae and lids without erythema or pallor. Pupils are equally round and reactive to light and accommodation. External inspection of the ears and nose is without lesions or masses. Hearing is intact to whispered voice. Nasal mucosa, septum, and turbinates are without lesions or masses. Lips, teeth, and gums are without lesions or masses. Oropharynx is clear. Oral mucosa is moist. Posterior pharynx is without exudate.   NECK: Supple. There are no masses. Thyroid is not enlarged. There are no nodules.   LUNGS: She is not in respiratory distress. There are no intercostal retractions or use of accessory muscles. Lungs are clear throughout. There is no wheezing, rales, or rhonchi.   CARDIOVASCULAR: PMI is nondisplaced. Normal S1 and S2. No murmurs, rubs, or gallops. Pedal pulses are 2+ and equal bilaterally.   ABDOMEN: Soft, nontender, nondistended. Liver and spleen are not enlarged.   LYMPH: No cervical or axillary lymphadenopathy.  EXTREMITIES: No clubbing, cyanosis, or edema. Full range of motion and strength with  good tone in all four extremities.   SKIN: Inspection of the skin reveals multiple bruises in various stages of healing covering her legs and arms. No lacerations are visible.   NEUROLOGIC: Cranial nerves II through XII are  grossly intact. Deep tendon reflexes are 2+ and equal bilaterally. Judgment and insight is poor. The patient is oriented to person, place, and time.   LABORATORY DATA: White blood cell count 6.9, hematocrit 38.6, platelet count 120, sodium 144, potassium 3.6, chloride 110, bicarb 22, BUN 10, creatinine 0.8, glucose 88, AST and ALT are 90 and 76. Alcohol is 0.101. TSH is 2.06. Chest x-ray is clear. EKG is sinus tachycardia with a QTc of 49. Tylenol level is less than 2. Salicylates are less than 2.5 U-tox is positive for cocaine, benzodiazepines, and marijuana.   ASSESSMENT AND PLAN: This is a 44 year old woman with schizoaffective disorder who presents with intentional overdose of Seroquel as well as alcohol, cocaine, and benzodiazepine intoxication. 1. Intentional overdose. She is not acutely suicidal. She does have a Associate Professor for safety. Will get a psychiatric evaluation. She is on telemetry. Will watch her QTc interval with serial EKGs.  2. Transaminitis. Will check a CMP in the morning, likely secondary to alcohol abuse.  3. Hypertension. She is likely hypotensive secondary to Seroquel. Will hold her Norvasc for now and restart when her blood pressure creeps back up.   TOTAL TIME SPENT ON CARE AND COORDINATION: 45 minutes.   ____________________________ Stephani Police Owens Shark, MD jlb:drc D: 10/30/2011 17:36:26 ET T: 10/31/2011 06:53:57 ET JOB#: [id number]  cc: Anderson Malta L. Owens Shark, MD, <Dictator> Osa Craver MD ELECTRONICALLY SIGNED 11/01/2011 13:37

## 2015-01-25 NOTE — Consult Note (Signed)
Brief Consult Note: Diagnosis: Schizoaffective d/o bipolar type, alcohol abuse, benzodiazepine abuse, MJ abuse.   Patient was seen by consultant.   Consult note dictated.   Recommend further assessment or treatment.   Orders entered.   Discussed with Attending MD.   Comments: [name] has a h/o depression and psychosis. She has been stable on Seroquel prescribed by Dr. Alexis Goodell at [facility]. She overdosed on 5 pills of Seroquel when exasperated with her grandmother with dementia. GM will be soon placed in a nursing home. She is no longer suicidal or homicidal.   PLAN: 1. The patient no longer meets criteria for IVC. PLease discharge as appropriate.  2. She has an appointment with Dr. Tamala Julian at [facility] on 11/03/11.  3. I will d/c sitter.  Electronic Signatures: Orson Slick (MD)  (Signed 28-Jan-13 12:29)  Authored: Brief Consult Note   Last Updated: 28-Jan-13 12:29 by Orson Slick (MD)

## 2015-01-25 NOTE — Discharge Summary (Signed)
PATIENT NAME:  [name], HARROW MR#:  [id number] DATE OF BIRTH:  1971-03-18  DATE OF ADMISSION:  10/30/2011 DATE OF DISCHARGE:  10/31/2011  PRESENTING COMPLAINT: Intentional overdose with Seroquel, polysubstance abuse.   DISCHARGE DIAGNOSES:  1. Intentional overdose with Seroquel. 2. Polysubstance abuse with cocaine and marijuana. 3. Depression.  4. Hypertension.   CONDITION ON DISCHARGE: Fair. Vitals stable.   DISCHARGE MEDICATIONS: 1. Amlodipine 5 mg p.o. daily.  2. Norco 5/325 mg one every six hours as needed.  3. Seroquel 1000 mg at bedtime.  4. Zantac 150 mg twice a day.  DISCHARGE INSTRUCTIONS/FOLLOWUP: Follow-up with your psychiatrist, Dr. Tamala Julian, at [facility], on 11/03/2011. Follow up with her primary care physician as before.   CONSULTANTS: Orson Slick, MD - Psychiatry.  LABS/STUDIES: CBC is within normal limits, except platelets of 108. Sodium 146, potassium 3.7, glucose 78, chloride 112, bicarbonate 23, calcium 8.1. SGOT 78, total protein 6.0, albumin 2.9. Serum acetaminophen level x2 is negative.   Chest x-ray: no acute cardiopulmonary abnormality.   Serum ethanol level on admission was 0.101.   Lipase 95. Urine pregnancy test is negative. Salicylates 2.5. TSH 2.06.   Urinalysis: Negative for urinary tract infection.   Urine drug screen positive for cocaine, cannabinoids, and benzodiazepines.   EKG showed sinus tach.   BRIEF SUMMARY OF HOSPITAL COURSE: Ms. Lantry is a 44 year old Caucasian female with past medical history of schizoaffective disorder who presented with: 1. Intentional overdose: The patient took an unknown quantity of Seroquel along with alcohol and cocaine. The patient was admitted to the medical floor. She remained in sinus rhythm. Her QTc interval was monitored given she overdosed on Seroquel. She was continued on IV fluids which were discontinued once she was able to tolerate p.o. medications. A sitter was in the room for safety until she  was evaluated by Dr. Orson Slick and she discontinued the sitter. After evaluation of the patient, Dr. Orson Slick, from psychiatry, was okay for the patient to be released home to followup with Dr. Tamala Julian, psychiatrist, at [facility].  2. Mild transaminitis secondary to alcohol abuse: PRN Ativan was used for alcohol withdrawal protocol.  3. Relative hypotension: resolved after IV fluids. The patient will resume her Norvasc after discharge.  Her hospital stay otherwise remained stable. The discharge plan was discussed with the patient's husband, Liliana Dang, over the telephone by me.   TIME SPENT: 40 minutes.  ____________________________ Hart Rochester Posey Pronto, MD sap:slb D: 11/01/2011 06:26:01 ET T: 11/01/2011 11:48:45 ET JOB#: [id number]  cc: Tomasita Beevers A. Posey Pronto, MD, <Dictator> Ilda Basset MD ELECTRONICALLY SIGNED 11/05/2011 15:40
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
## 2015-02-01 NOTE — Discharge Summary (Signed)
PATIENT NAME:  [name], [name] MR#:  [id number] DATE OF BIRTH:  [birth year]  DATE OF ADMISSION:  10/21/2014 DATE OF DISCHARGE:  10/23/2014  DISCHARGE DIAGNOSES:  1.  Left tibia fracture, status post open reduction, internal fixation.  2.  Asthma.   3.  Bipolar disorder.   CONDITION ON DISCHARGE: Stable.   MEDICATION ON DISCHARGE:  1.  Gabapentin 300 mg oral capsule, 2 capsules 3 times a day.  2.  Benztropine 0.5 mg oral tablet, 1 tablet 2 times a day.  3.  Haldol 2 mg oral tablet 3 times a day.  4.  ProAir inhalation 2 puffs 4 to 6 hours as needed for shortness of breath.  5.  Furosemide 40 mg oral tablet once a day as needed.  6.  Fluticasone nasal 2 sprays once a day.  7.  Omeprazole 40 mg delayed release once a day.  8.  Premarin 0.625 oral tablet once a day.  9.  nicotrol 10 mg inhalation device 1 to 2 hours for nicotine craving.  10.  Flexeril 10 mg oral 3 times a day as needed for spasms.  11.  Xanax 2 mg oral tablet 3 times a day as needed for anxiety and nervousness.  12.  Seroquel 300 mg oral tablet, 2 tablets once a day at bedtime.  13.  Oxycodone 5 mg oral tablet every 4 hours as needed for moderate pain.  14.  Acetaminophen 2 tablets every 4 hours as needed for mild pain.  15.  Lovenox once a day.   DIET ON DISCHARGE: Regular.   DISCHARGE FOLLOWUP: Advised to follow up with primary care physician and orthopedic clinic in 2 weeks.   HISTORY OF PRESENTING ILLNESS: A 44 year old Caucasian female with past history of hepatitis C, degenerative joint disease presented with left leg pain after having a mechanical fall.  She had pain 10/10. She came to the hospital for workup and evaluation and found having left sided distal tibia fracture.   HOSPITAL COURSE AND STAY:  1.  Left tibia fractures open reduction and internal fixation done by orthopedic and suggested to have physical therapy as outpatient.  The patient  was able to walk, pain control done by orthopedic.  2.   Gastroesophageal reflux disease without esophagitis. Protonix was given.  3.  Bipolar disorder. The patient did not have any active symptoms and we continued baseline medications, benztropine, Xanax, and Seroquel.    4.  COPD.  This was also baseline. There was not any wheezing and continued her inhalers.    [place]: Orthopedic consult with Dr. Marry Guan  IMPORTANT LABORATORY RESULTS IN THE HOSPITAL: X-ray tibia and fibula on the left:  Displaced and rotated upward fibular and distal tibia fracture.   WBC count 9.1, hemoglobin 11.2, platelet count is 74, MCV is 93.   Glucose serum 156, creatinine is 1.17, sodium 136 and potassium 3.3, chloride 99 and CO2 is 29.   INR is 1.4.   Urine pregnancy test is negative.   TOTAL TIME SPENT ON THIS DISCHARGE: 40 minutes.    ____________________________ Ceasar Lund Anselm Jungling, MD vgv:AT D: 10/24/2014 19:38:16 ET T: 10/25/2014 02:06:49 ET JOB#: [id number]  cc: Ceasar Lund. Anselm Jungling, MD, <Dictator> [place] MD ELECTRONICALLY SIGNED 11/05/2014 23:08

## 2015-02-01 NOTE — Consult Note (Signed)
Brief Consult Note: Diagnosis: opiate abuse, polysubstance abuse, bipolar disorder.   Patient was seen by consultant.   Consult note dictated.   Discussed with Attending MD.   Comments: Psychiatry: Was intoxicated. Now sober. No acute violence or SI or threats. Medically stable. DC the IVC and release to outpt care.  Electronic Signatures: Clapacs, Madie Reno (MD)  (Signed 15-Mar-16 11:22)  Authored: Brief Consult Note   Last Updated: 15-Mar-16 11:22 by Gonzella Lex (MD)

## 2015-02-01 NOTE — Consult Note (Signed)
Brief Consult Note: Diagnosis: Left tib/fib fracture.   Patient was seen by consultant.   Consult note dictated.   Recommend to proceed with surgery or procedure.   Orders entered.   Comments: To OR today for IM nailing of left tibia fracture.  Electronic Signatures: Elenore Rota (MD)  (Signed 19-Jan-16 08:56)  Authored: Brief Consult Note   Last Updated: 19-Jan-16 08:56 by Elenore Rota (MD)

## 2015-02-01 NOTE — Consult Note (Signed)
PATIENT NAME:  [name], [name] MR#:  [id number] DATE OF BIRTH:  October 09, 1970  DATE OF CONSULTATION:  10/21/2014  REFERRING PHYSICIAN:  Valentino Nose, MD  CONSULTING PHYSICIAN:  Pascal Lux, MD  REASON FOR CONSULTATION:  I was asked by Dr. Valentino Nose to evaluate this pleasant woman for a left lower leg injury.   HISTORY OF PRESENT ILLNESS: Briefly, she is a 44 year old female with a history of hepatitis C, degenerative joint disease, gastroesophageal reflux disease, schizoaffective disorder, and asthma, who apparently sustained a mechanical trip and fall at home last evening, injuring her left leg. She normally ambulates with a walker.  Apparently her room was dark when she got up from bed and so apparently tripped over her walker.  She was brought to the Emergency Room where x-rays demonstrated a tib-fib fracture with rotational deformity. The fibula was fractured proximally and the tibia was fractured in the mid-distal third. The fracture was reduced by the ER physician and a short leg posterior splint with a sugar tong supplement applied. The patient subsequently was admitted for pain management and in preparation for definitive management of this injury. The patient denies any associated injuries.  In addition, she declines any loss of consciousness, lightheadedness, shortness of breath, or chest pain that may have precipitated her fall.   On examination, we have a pleasant middle-aged female resting comfortably in bed.  She is alert and appropriately responsive. Orthopedic examination is limited to her left lower leg and foot. The leg is in a short-leg posterior splint. The splint appears to be intact and the foot and leg appropriately positioned.  She can dorsiflex and plantar flex her toes, although motion is limited due to some discomfort. Sensation is intact to light touch to the toes as well as to the first web space.  She has good capillary refill to all digits.   DIAGNOSTIC DATA:  X-ray of  the left lower leg are available for review. These films demonstrate an oblique tibia fracture with translation, as well as a short oblique fracture of the proximal fibula.   IMPRESSION: Closed displaced left tibia-fibula fracture.   PLAN: The treatment options were discussed with the patient.  She would best most benefit from internal fixation of the left tibia-fibula fracture with an IM nail. This procedure has been discussed in detail with the patient as have the potential risks (including bleeding, infection, nerve end or blood vessel injury, persistent, recurrent pain, stiffness, malunion and/or nonunion, stiffness of the ankle and/or knee, and need for further surgery, blood clots, strokes, heart attack, and/or arrhythmias) and benefits.  This patient states her understanding and wishes to proceed. A consent will be signed in the near future.   Thank you for asking me to participate in the care of this most pleasant, yet unfortunate woman. We will be happy to follow her with you.      ____________________________ J. Dorien Chihuahua, MD jjp:DT D: 10/21/2014 09:03:33 ET T: 10/21/2014 10:33:51 ET JOB#: [id number]  cc: Pascal Lux, MD, <Dictator> JEFF Robby Sermon MD ELECTRONICALLY SIGNED 10/21/2014 15:02
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
## 2015-02-01 NOTE — H&P (Signed)
PATIENT NAME:  [name], [name] MR#:  [id number] DATE OF BIRTH:  1971/07/19  DATE OF ADMISSION:  10/21/2014  REFERRING PHYSICIAN: Valli Glance. Owens Shark, MD  PRIMARY CARE PHYSICIAN: Meindert A. Brunetta Genera, MD  CHIEF COMPLAINT: Left leg pain.   HISTORY OF PRESENT ILLNESS: A 44 year old Caucasian female with past medical history of hepatitis C, degenerative joint disease, GERD, presenting with left leg pain. This was acute left leg pain after suffering a mechanical fall. States she uses a walker for ambulation. She states that her room was dark and actually tripped over her walker and noticed immediate pain over her left leg; quantifying pain only as "pain," intensity 10/10, radiating proximally, worse with movements, no relieving factors. She came to the hospital for workup and evaluation, found to have a left-sided distal tibia fracture.   REVIEW OF SYSTEMS:  CONSTITUTIONAL: Denies fevers, chills, fatigue, weakness.  EYES: Denies blurred vision, double vision, eye pain.  EARS, NOSE, THROAT: Denies tinnitus, ear pain, hearing loss. RESPIRATORY: Denies cough, wheeze, shortness of breath.  CARDIOVASCULAR: Denies chest pain, palpitations, edema.  GASTROINTESTINAL: Denies nausea, vomiting, diarrhea, abdominal pain.  GENITOURINARY: Denies dysuria or hematuria.  ENDOCRINE: Denies nocturia or thyroid problems.  HEMATOLOGIC AND LYMPHATIC: Denies easy bruising or bleeding.  SKIN: Denies rash or lesions.  MUSCULOSKELETAL: Positive pain on the left leg as described above. Otherwise, denies neck, back, shoulder, knees, hip pain, or further arthritic symptoms.  NEUROLOGIC: Denies paralysis or paresthesias.  PSYCHIATRIC: Denies anxiety or depressive symptoms.   Otherwise, full review of systems performed by me is negative.   PAST MEDICAL HISTORY: PTSD; anxiety, not otherwise specified; bipolar disorder, not otherwise specified; gastroesophageal reflux disease without esophagitis; history of hepatitis C, as  well as intermittent asthma.   SOCIAL HISTORY: Positive for tobacco use. Denies any alcohol or drug use.   FAMILY HISTORY: Denies any known cardiovascular or pulmonary disorders.   ALLERGIES: SULFA DRUGS, TRAMADOL, ZOLOFT.   HOME MEDICATIONS: Include gabapentin 300 mg 2 capsules p.o. 3 times daily, benztropine 0.5 mg p.o. b.i.d., haloperidol 2 mg p.o. 3 times daily, ProAir 90 mcg inhalation 2 puffs every 4 hours as needed for shortness of breath, Lasix 40 mg p.o. daily, Flonase 2 sprays daily, Prilosec 40 mg p.o. daily, Premarin 0.625 mg p.o. daily, vitamin D2 at 50,000 international units p.o. daily.   PHYSICAL EXAMINATION:  VITAL SIGNS: Temperature 98.6, heart rate 92, respirations 14, blood pressure 101/62, saturating 98% on room air. Weight 92.1 kg, BMI of 36.  GENERAL: Well-nourished, well-developed, Caucasian female without any acute distress.  HEAD: Normocephalic, atraumatic.  EYES: Pupils equal, round, reactive to light. Extraocular muscles intact. No scleral icterus.  MOUTH: Moist mucous membranes. Dentition intact. No abscess noted. EARS, NOSE, THROAT: Clear. No exudates. No external lesions. NECK: Supple. No thyromegaly. No nodules. No JVD.  PULMONARY: Clear to auscultation bilaterally without wheezes, rales, or rhonchi. No use of accessory muscles. Good respiratory effort. CHEST: Nontender on palpation.  CARDIOVASCULAR: S1, S2, regular rate and rhythm. No murmurs, rubs, or gallops. No edema. Pedal pulses 2+ bilaterally.  GASTROINTESTINAL: Soft, nontender, nondistended. No masses. Positive bowel sounds. No hepatosplenomegaly.  MUSCULOSKELETAL: No swelling, clubbing, or edema. Range of motion limited in the left lower extremity. It is currently immobilized after her fracture. She does have distal movement of her toes.  NEUROLOGIC: Cranial nerves II through XII intact. No gross focal neurological deficits. Sensation intact. Reflexes intact.  SKIN: No ulceration, lesions, rashes, or  cyanosis. Skin warm, dry. Turgor intact.  PSYCHIATRIC: Mood and  affect within normal limits. The patient is awake, alert, oriented x 3. Insight and judgment intact.   LABORATORY DATA: X-ray of the left leg reveals a displaced and rotated upper fibular and distal tibia diaphysis fractures. Remainder of laboratory data pending at this time.   ASSESSMENT AND PLAN: A 44 year old Caucasian female with a history of hepatitis C, gastroesophageal reflux disease, osteoarthritis, presenting with leg pain, found to have a left tibia-fibula fracture.  1.  Preoperative evaluation for left tibia-fibula fracture. She should be considered at moderate risk for moderate risk surgery from a cardiac standpoint. Her Metabolic Equivalents (METS) are considered less than 4 given generalized dysmobility. As far as medications are concerned, we hold diuretics and Lasix, as well as Premarin. Continue all other medications preoperatively. For venous thromboembolism prophylaxis: Sequential compression devices. Pain medications as required. Initiate bowel regimen. We will consult orthopedics and have already discussed the case the Emergency Department with the Emergency Room staff.  2.  Gastroesophageal reflux disease without esophagitis: Proton pump inhibitor.  3.  Bipolar disorder, not otherwise specified: Continue with Haldol and benztropine.  4.  Venous thromboembolism prophylaxis with sequential compression devices for now.  CODE STATUS: The patient is FULL CODE.   TIME SPENT: 45 minutes.    ____________________________ Aaron Mose. Hower, MD dkh:ts D: 10/21/2014 02:28:38 ET T: 10/21/2014 02:53:10 ET JOB#: [id number]  cc: Aaron Mose. Hower, MD, <Dictator> DAVID Woodfin Ganja MD ELECTRONICALLY SIGNED 10/22/2014 3:29
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
## 2015-02-18 ENCOUNTER — Emergency Department
Admission: EM | Admit: 2015-02-18 | Discharge: 2015-02-18 | Disposition: A | Discharge status: Home / Self Care | Payer: Medicaid Other | Attending: Emergency Medicine | Admitting: Emergency Medicine

## 2015-02-18 ENCOUNTER — Emergency Department: Payer: Medicaid Other

## 2015-02-18 ENCOUNTER — Other Ambulatory Visit: Payer: Self-pay

## 2015-02-18 ENCOUNTER — Encounter: Payer: Self-pay | Admitting: *Deleted

## 2015-02-18 DIAGNOSIS — S82202G Unspecified fracture of shaft of left tibia, subsequent encounter for closed fracture with delayed healing: Secondary | ICD-10-CM | POA: Diagnosis not present

## 2015-02-18 DIAGNOSIS — S5002XA Contusion of left elbow, initial encounter: Secondary | ICD-10-CM

## 2015-02-18 DIAGNOSIS — S0093XA Contusion of unspecified part of head, initial encounter: Secondary | ICD-10-CM | POA: Diagnosis not present

## 2015-02-18 DIAGNOSIS — T148 Other injury of unspecified body region: Secondary | ICD-10-CM | POA: Insufficient documentation

## 2015-02-18 DIAGNOSIS — Y998 Other external cause status: Secondary | ICD-10-CM | POA: Insufficient documentation

## 2015-02-18 DIAGNOSIS — Z720 Tobacco use: Secondary | ICD-10-CM | POA: Insufficient documentation

## 2015-02-18 DIAGNOSIS — Y9289 Other specified places as the place of occurrence of the external cause: Secondary | ICD-10-CM | POA: Diagnosis not present

## 2015-02-18 DIAGNOSIS — Y9389 Activity, other specified: Secondary | ICD-10-CM | POA: Insufficient documentation

## 2015-02-18 DIAGNOSIS — T148XXA Other injury of unspecified body region, initial encounter: Secondary | ICD-10-CM

## 2015-02-18 DIAGNOSIS — S0990XA Unspecified injury of head, initial encounter: Secondary | ICD-10-CM | POA: Diagnosis present

## 2015-02-18 DIAGNOSIS — S82402G Unspecified fracture of shaft of left fibula, subsequent encounter for closed fracture with delayed healing: Secondary | ICD-10-CM

## 2015-02-18 DIAGNOSIS — S8292XG Unspecified fracture of left lower leg, subsequent encounter for closed fracture with delayed healing: Secondary | ICD-10-CM | POA: Diagnosis not present

## 2015-02-18 HISTORY — DX: Other specified abdominal hernia without obstruction or gangrene: K45.8

## 2015-02-18 HISTORY — DX: Bipolar disorder, unspecified: F31.9

## 2015-02-18 HISTORY — DX: Unspecified abdominal hernia without obstruction or gangrene: K46.9

## 2015-02-18 HISTORY — DX: Post-traumatic stress disorder, unspecified: F43.10

## 2015-02-18 MED ORDER — QUETIAPINE FUMARATE 200 MG PO TABS
ORAL_TABLET | ORAL | Status: AC
Start: 2015-02-18 — End: 2015-02-18
  Administered 2015-02-18: 600 mg
  Filled 2015-02-18: qty 3

## 2015-02-18 MED ORDER — HYDROMORPHONE HCL 1 MG/ML IJ SOLN
INTRAMUSCULAR | Status: AC
  Filled 2015-02-18: qty 1

## 2015-02-18 MED ORDER — OXYCODONE HCL 5 MG PO TABS: 10.0000 mg | ORAL_TABLET | ORAL | Status: DC

## 2015-02-18 MED ORDER — LORAZEPAM 2 MG/ML IJ SOLN
1.0000 mg | Freq: Once | INTRAMUSCULAR | Status: AC
  Administered 2015-02-18: 1 mg via INTRAMUSCULAR

## 2015-02-18 MED ORDER — LORAZEPAM 2 MG/ML IJ SOLN
INTRAMUSCULAR | Status: AC
  Administered 2015-02-18: 1 mg via INTRAMUSCULAR
  Filled 2015-02-18: qty 1

## 2015-02-18 MED ORDER — HYDROMORPHONE HCL 1 MG/ML IJ SOLN
1.0000 mg | INTRAMUSCULAR | Status: AC
  Administered 2015-02-18: 1 mg via INTRAMUSCULAR

## 2015-02-18 MED ORDER — QUETIAPINE FUMARATE ER 300 MG PO TB24: 600.0000 mg | ORAL_TABLET | ORAL | Status: DC
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
## 2015-02-21 ENCOUNTER — Encounter: Payer: Self-pay | Admitting: Emergency Medicine

## 2015-02-21 ENCOUNTER — Emergency Department: Payer: Medicaid Other

## 2015-02-21 ENCOUNTER — Emergency Department
Admission: EM | Admit: 2015-02-21 | Discharge: 2015-02-21 | Disposition: A | Discharge status: Home / Self Care | Payer: Medicaid Other | Attending: Emergency Medicine | Admitting: Emergency Medicine

## 2015-02-21 DIAGNOSIS — S20211A Contusion of right front wall of thorax, initial encounter: Secondary | ICD-10-CM | POA: Diagnosis not present

## 2015-02-21 DIAGNOSIS — Z720 Tobacco use: Secondary | ICD-10-CM | POA: Diagnosis not present

## 2015-02-21 DIAGNOSIS — Y998 Other external cause status: Secondary | ICD-10-CM | POA: Insufficient documentation

## 2015-02-21 DIAGNOSIS — Y9289 Other specified places as the place of occurrence of the external cause: Secondary | ICD-10-CM | POA: Insufficient documentation

## 2015-02-21 DIAGNOSIS — S299XXA Unspecified injury of thorax, initial encounter: Secondary | ICD-10-CM | POA: Diagnosis present

## 2015-02-21 DIAGNOSIS — X58XXXA Exposure to other specified factors, initial encounter: Secondary | ICD-10-CM | POA: Insufficient documentation

## 2015-02-21 DIAGNOSIS — F419 Anxiety disorder, unspecified: Secondary | ICD-10-CM | POA: Insufficient documentation

## 2015-02-21 DIAGNOSIS — Y9389 Activity, other specified: Secondary | ICD-10-CM | POA: Diagnosis not present

## 2015-02-21 HISTORY — DX: Unspecified fracture of unspecified lower leg, initial encounter for closed fracture: S82.90XA

## 2015-02-21 NOTE — ED Notes (Signed)
Patient refused to leave, stating that she hadn't see a doctor. This Probation officer spoke with the PA who saw her and reaffirmed that the patient was discharged. [place] [employer] and a female tech were called to room. Patient refused to sign e-signature and left via wheelchair to lobby.
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
## 2015-02-21 NOTE — Discharge Instructions (Signed)
Chest Contusion A chest contusion is a deep bruise on your chest area. Contusions are the result of an injury that caused bleeding under the skin. A chest contusion may involve bruising of the skin, muscles, or ribs. The contusion may turn blue, purple, or yellow. Minor injuries will give you a painless contusion, but more severe contusions may stay painful and swollen for a few weeks. CAUSES  A contusion is usually caused by a blow, trauma, or direct force to an area of the body. SYMPTOMS   Swelling and redness of the injured area.  Discoloration of the injured area.  Tenderness and soreness of the injured area.  Pain. DIAGNOSIS  The diagnosis can be made by taking a history and performing a physical exam. An X-ray, CT scan, or MRI may be needed to determine if there were any associated injuries, such as broken bones (fractures) or internal injuries. TREATMENT  Often, the best treatment for a chest contusion is resting, icing, and applying cold compresses to the injured area. Deep breathing exercises may be recommended to reduce the risk of pneumonia. Over-the-counter medicines may also be recommended for pain control. HOME CARE INSTRUCTIONS   Put ice on the injured area.  Put ice in a plastic bag.  Place a towel between your skin and the bag.  Leave the ice on for 15-20 minutes, 03-04 times a day.  Only take over-the-counter or prescription medicines as directed by your caregiver. Your caregiver may recommend avoiding anti-inflammatory medicines (aspirin, ibuprofen, and naproxen) for 48 hours because these medicines may increase bruising.  Rest the injured area.  Perform deep-breathing exercises as directed by your caregiver.  Stop smoking if you smoke.  Do not lift objects over 5 pounds (2.3 kg) for 3 days or longer if recommended by your caregiver. SEEK IMMEDIATE MEDICAL CARE IF:   You have increased bruising or swelling.  You have pain that is getting worse.  You have  difficulty breathing.  You have dizziness, weakness, or fainting.  You have blood in your urine or stool.  You cough up or vomit blood.  Your swelling or pain is not relieved with medicines. MAKE SURE YOU:   Understand these instructions.  Will watch your condition.  Will get help right away if you are not doing well or get worse. Document Released: 06/14/2001 Document Revised: 06/13/2012 Document Reviewed: 03/12/2012 [employer] Patient Information 2015 [place], [place]. This information is not intended to replace advice given to you by your health care provider. Make sure you discuss any questions you have with your health care provider.   Keep your left leg with cast elevated to avoid swelling and decrease discomfort.  You will need to call the office on Monday about your cast
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
## 2015-02-23 ENCOUNTER — Other Ambulatory Visit: Payer: Self-pay | Admitting: Oncology

## 2015-02-23 DIAGNOSIS — D696 Thrombocytopenia, unspecified: Secondary | ICD-10-CM

## 2015-02-24 ENCOUNTER — Inpatient Hospital Stay: Payer: Medicaid Other

## 2015-02-24 ENCOUNTER — Inpatient Hospital Stay: Payer: Medicaid Other | Admitting: Oncology

## 2015-02-24 ENCOUNTER — Telehealth: Payer: Self-pay | Admitting: *Deleted

## 2015-02-24 NOTE — Telephone Encounter (Signed)
Informed pt we do not arrange this and that she missed 2 appts with [place] Advised her that she could call [employer] herself to arrange meals and tha a scheduler should be contacting her to come in for an appt

## 2015-02-24 NOTE — Telephone Encounter (Signed)
I put in a reschedule order for patient this morning.

## 2015-03-09 ENCOUNTER — Ambulatory Visit: Payer: Medicaid Other | Admitting: Oncology

## 2015-03-09 ENCOUNTER — Other Ambulatory Visit: Payer: Medicaid Other

## 2015-03-24 ENCOUNTER — Inpatient Hospital Stay
Admission: EM | Admit: 2015-03-24 | Discharge: 2015-03-26 | DRG: 193 | Disposition: A | Discharge status: Home / Self Care | Payer: Medicaid Other | Attending: Internal Medicine | Admitting: Internal Medicine

## 2015-03-24 ENCOUNTER — Emergency Department: Payer: Medicaid Other

## 2015-03-24 DIAGNOSIS — F4310 Post-traumatic stress disorder, unspecified: Secondary | ICD-10-CM | POA: Diagnosis present

## 2015-03-24 DIAGNOSIS — Z8249 Family history of ischemic heart disease and other diseases of the circulatory system: Secondary | ICD-10-CM | POA: Diagnosis not present

## 2015-03-24 DIAGNOSIS — E876 Hypokalemia: Secondary | ICD-10-CM | POA: Diagnosis present

## 2015-03-24 DIAGNOSIS — R7989 Other specified abnormal findings of blood chemistry: Secondary | ICD-10-CM | POA: Diagnosis present

## 2015-03-24 DIAGNOSIS — J449 Chronic obstructive pulmonary disease, unspecified: Secondary | ICD-10-CM | POA: Diagnosis present

## 2015-03-24 DIAGNOSIS — M1990 Unspecified osteoarthritis, unspecified site: Secondary | ICD-10-CM | POA: Diagnosis present

## 2015-03-24 DIAGNOSIS — J9601 Acute respiratory failure with hypoxia: Secondary | ICD-10-CM | POA: Diagnosis present

## 2015-03-24 DIAGNOSIS — G8929 Other chronic pain: Secondary | ICD-10-CM

## 2015-03-24 DIAGNOSIS — R0902 Hypoxemia: Secondary | ICD-10-CM

## 2015-03-24 DIAGNOSIS — Z818 Family history of other mental and behavioral disorders: Secondary | ICD-10-CM | POA: Diagnosis not present

## 2015-03-24 DIAGNOSIS — G894 Chronic pain syndrome: Secondary | ICD-10-CM | POA: Diagnosis present

## 2015-03-24 DIAGNOSIS — Z820 Family history of epilepsy and other diseases of the nervous system: Secondary | ICD-10-CM

## 2015-03-24 DIAGNOSIS — R079 Chest pain, unspecified: Secondary | ICD-10-CM

## 2015-03-24 DIAGNOSIS — Z888 Allergy status to other drugs, medicaments and biological substances status: Secondary | ICD-10-CM | POA: Diagnosis not present

## 2015-03-24 DIAGNOSIS — Z833 Family history of diabetes mellitus: Secondary | ICD-10-CM | POA: Diagnosis not present

## 2015-03-24 DIAGNOSIS — Z882 Allergy status to sulfonamides status: Secondary | ICD-10-CM

## 2015-03-24 DIAGNOSIS — F419 Anxiety disorder, unspecified: Secondary | ICD-10-CM | POA: Diagnosis present

## 2015-03-24 DIAGNOSIS — F319 Bipolar disorder, unspecified: Secondary | ICD-10-CM | POA: Diagnosis present

## 2015-03-24 DIAGNOSIS — Z9889 Other specified postprocedural states: Secondary | ICD-10-CM

## 2015-03-24 DIAGNOSIS — J45901 Unspecified asthma with (acute) exacerbation: Secondary | ICD-10-CM | POA: Diagnosis present

## 2015-03-24 DIAGNOSIS — J9600 Acute respiratory failure, unspecified whether with hypoxia or hypercapnia: Secondary | ICD-10-CM

## 2015-03-24 DIAGNOSIS — F17210 Nicotine dependence, cigarettes, uncomplicated: Secondary | ICD-10-CM | POA: Diagnosis present

## 2015-03-24 DIAGNOSIS — R740 Nonspecific elevation of levels of transaminase and lactic acid dehydrogenase [LDH]: Secondary | ICD-10-CM

## 2015-03-24 DIAGNOSIS — S82202A Unspecified fracture of shaft of left tibia, initial encounter for closed fracture: Secondary | ICD-10-CM

## 2015-03-24 DIAGNOSIS — R7401 Elevation of levels of liver transaminase levels: Secondary | ICD-10-CM

## 2015-03-24 DIAGNOSIS — J189 Pneumonia, unspecified organism: Secondary | ICD-10-CM | POA: Diagnosis present

## 2015-03-24 LAB — TROPONIN I: Troponin I: <0.03 ng/mL (ref <0.031)

## 2015-03-24 LAB — BLOOD GAS, ARTERIAL
Acid-base deficit: 0.5 mmol/L (ref 0.0–2.0)
Allens test (pass/fail): POSITIVE — AB
Bicarbonate: 24.2 mEq/L (ref 21.0–28.0)
FIO2: 0.28 %
O2 Saturation: 92.4 %
PH ART: 7.4 (ref 7.350–7.450)
Patient temperature: 37
pCO2 arterial: 39 mmHg (ref 32.0–48.0)
pO2, Arterial: 65 mmHg — ABNORMAL LOW (ref 83.0–108.0)

## 2015-03-24 LAB — CBC WITH DIFFERENTIAL/PLATELET
Basophils Absolute: 0 10*3/uL (ref 0–0.1)
Basophils Relative: 0 %
Eosinophils Absolute: 0.2 10*3/uL (ref 0–0.7)
Eosinophils Relative: 2 %
HEMATOCRIT: 33.7 % — AB (ref 35.0–47.0)
HEMOGLOBIN: 11.6 g/dL — AB (ref 12.0–16.0)
LYMPHS ABS: 1 10*3/uL (ref 1.0–3.6)
MCH: 31.9 pg (ref 26.0–34.0)
MCHC: 34.4 g/dL (ref 32.0–36.0)
MCV: 92.7 fL (ref 80.0–100.0)
Monocytes Absolute: 0.2 10*3/uL (ref 0.2–0.9)
Monocytes Relative: 4 %
NEUTROS ABS: 5.4 10*3/uL (ref 1.4–6.5)
Platelets: 81 10*3/uL — ABNORMAL LOW (ref 150–440)
RBC: 3.64 MIL/uL — AB (ref 3.80–5.20)
RDW: 14.9 % — ABNORMAL HIGH (ref 11.5–14.5)
WBC: 6.8 10*3/uL (ref 3.6–11.0)

## 2015-03-24 LAB — URINE DRUG SCREEN, QUALITATIVE (ARMC ONLY)
AMPHETAMINES, UR SCREEN: POSITIVE — AB
Barbiturates, Ur Screen: NOT DETECTED
Benzodiazepine, Ur Scrn: POSITIVE — AB
COCAINE METABOLITE, UR ~~LOC~~: NOT DETECTED
Cannabinoid 50 Ng, Ur ~~LOC~~: NOT DETECTED
MDMA (ECSTASY) UR SCREEN: NOT DETECTED
Methadone Scn, Ur: NOT DETECTED
OPIATE, UR SCREEN: NOT DETECTED
PHENCYCLIDINE (PCP) UR S: NOT DETECTED
Tricyclic, Ur Screen: POSITIVE — AB

## 2015-03-24 LAB — EXPECTORATED SPUTUM ASSESSMENT W GRAM STAIN, RFLX TO RESP C

## 2015-03-24 LAB — COMPREHENSIVE METABOLIC PANEL
ALT: 63 U/L — AB (ref 14–54)
AST: 112 U/L — AB (ref 15–41)
Albumin: 3 g/dL — ABNORMAL LOW (ref 3.5–5.0)
Alkaline Phosphatase: 163 U/L — ABNORMAL HIGH (ref 38–126)
Anion gap: 4 — ABNORMAL LOW (ref 5–15)
BUN: 15 mg/dL (ref 6–20)
CALCIUM: 7.7 mg/dL — AB (ref 8.9–10.3)
CHLORIDE: 102 mmol/L (ref 101–111)
CO2: 26 mmol/L (ref 22–32)
Creatinine, Ser: 0.76 mg/dL (ref 0.44–1.00)
GFR calc Af Amer: >60 mL/min (ref >60)
GFR calc non Af Amer: >60 mL/min (ref >60)
Glucose, Bld: 137 mg/dL — ABNORMAL HIGH (ref 65–99)
Potassium: 2.5 mmol/L — CL (ref 3.5–5.1)
Sodium: 132 mmol/L — ABNORMAL LOW (ref 135–145)
Total Bilirubin: 1.8 mg/dL — ABNORMAL HIGH (ref 0.3–1.2)
Total Protein: 6 g/dL — ABNORMAL LOW (ref 6.5–8.1)

## 2015-03-24 LAB — EXPECTORATED SPUTUM ASSESSMENT W REFEX TO RESP CULTURE

## 2015-03-24 LAB — FIBRIN DERIVATIVES D-DIMER (ARMC ONLY): Fibrin derivatives D-dimer (ARMC): 1246 — ABNORMAL HIGH (ref 0–499)

## 2015-03-24 LAB — POCT PREGNANCY, URINE: PREG TEST UR: NEGATIVE

## 2015-03-24 LAB — MAGNESIUM: MAGNESIUM: 1.5 mg/dL — AB (ref 1.7–2.4)

## 2015-03-24 LAB — BILIRUBIN, DIRECT: Bilirubin, Direct: 0.4 mg/dL (ref 0.1–0.5)

## 2015-03-24 LAB — TSH: TSH: 1.238 u[IU]/mL (ref 0.350–4.500)

## 2015-03-24 LAB — HEMOGLOBIN A1C: Hgb A1c MFr Bld: 4.8 % (ref 4.0–6.0)

## 2015-03-24 MED ORDER — FOLIC ACID 1 MG PO TABS
1.0000 mg | ORAL_TABLET | Freq: Every day | ORAL | Status: DC
  Administered 2015-03-24 – 2015-03-26 (×3): 1 mg via ORAL
  Filled 2015-03-24 (×3): qty 1

## 2015-03-24 MED ORDER — MAGNESIUM OXIDE 400 (241.3 MG) MG PO TABS
400.0000 mg | ORAL_TABLET | Freq: Two times a day (BID) | ORAL | Status: DC
  Administered 2015-03-24 – 2015-03-26 (×4): 400 mg via ORAL
  Filled 2015-03-24 (×4): qty 1

## 2015-03-24 MED ORDER — METHYLPREDNISOLONE SODIUM SUCC 125 MG IJ SOLR
60.0000 mg | Freq: Four times a day (QID) | INTRAMUSCULAR | Status: DC
  Administered 2015-03-24 – 2015-03-25 (×3): 60 mg via INTRAVENOUS
  Filled 2015-03-24 (×3): qty 2

## 2015-03-24 MED ORDER — CEFTRIAXONE SODIUM IN DEXTROSE 20 MG/ML IV SOLN
1.0000 g | INTRAVENOUS | Status: DC
  Filled 2015-03-24: qty 50

## 2015-03-24 MED ORDER — CEFTRIAXONE SODIUM IN DEXTROSE 20 MG/ML IV SOLN: 1.0000 g | Freq: Once | INTRAVENOUS | Status: DC

## 2015-03-24 MED ORDER — POTASSIUM CHLORIDE CRYS ER 20 MEQ PO TBCR
40.0000 meq | EXTENDED_RELEASE_TABLET | Freq: Once | ORAL | Status: AC
  Administered 2015-03-24: 40 meq via ORAL

## 2015-03-24 MED ORDER — LORATADINE 10 MG PO TABS
10.0000 mg | ORAL_TABLET | Freq: Every day | ORAL | Status: DC
  Administered 2015-03-24 – 2015-03-26 (×3): 10 mg via ORAL
  Filled 2015-03-24 (×3): qty 1

## 2015-03-24 MED ORDER — ALPRAZOLAM 1 MG PO TABS
2.0000 mg | ORAL_TABLET | Freq: Three times a day (TID) | ORAL | Status: DC
  Administered 2015-03-24 – 2015-03-26 (×3): 2 mg via ORAL
  Filled 2015-03-24 (×3): qty 2

## 2015-03-24 MED ORDER — FUROSEMIDE 20 MG PO TABS
40.0000 mg | ORAL_TABLET | Freq: Every day | ORAL | Status: DC
  Administered 2015-03-24 – 2015-03-26 (×3): 40 mg via ORAL
  Filled 2015-03-24 (×3): qty 2

## 2015-03-24 MED ORDER — DEXTROSE 5 % IV SOLN
250.0000 mg | INTRAVENOUS | Status: DC
  Filled 2015-03-24 (×2): qty 250

## 2015-03-24 MED ORDER — CEFTRIAXONE SODIUM IN DEXTROSE 20 MG/ML IV SOLN
INTRAVENOUS | Status: AC
  Filled 2015-03-24: qty 50

## 2015-03-24 MED ORDER — IPRATROPIUM-ALBUTEROL 0.5-2.5 (3) MG/3ML IN SOLN
3.0000 mL | RESPIRATORY_TRACT | Status: DC
  Administered 2015-03-24 – 2015-03-25 (×7): 3 mL via RESPIRATORY_TRACT
  Filled 2015-03-24 (×7): qty 3

## 2015-03-24 MED ORDER — POTASSIUM CHLORIDE CRYS ER 20 MEQ PO TBCR
40.0000 meq | EXTENDED_RELEASE_TABLET | Freq: Two times a day (BID) | ORAL | Status: DC
  Administered 2015-03-24 – 2015-03-25 (×2): 40 meq via ORAL
  Filled 2015-03-24 (×2): qty 2

## 2015-03-24 MED ORDER — HEPARIN SODIUM (PORCINE) 5000 UNIT/ML IJ SOLN
5000.0000 [IU] | Freq: Three times a day (TID) | INTRAMUSCULAR | Status: DC
  Administered 2015-03-24 – 2015-03-25 (×3): 5000 [IU] via SUBCUTANEOUS
  Filled 2015-03-24 (×3): qty 1

## 2015-03-24 MED ORDER — CEFTRIAXONE SODIUM IN DEXTROSE 20 MG/ML IV SOLN
1.0000 g | INTRAVENOUS | Status: DC
  Filled 2015-03-24 (×2): qty 50

## 2015-03-24 MED ORDER — GABAPENTIN 300 MG PO CAPS
400.0000 mg | ORAL_CAPSULE | Freq: Three times a day (TID) | ORAL | Status: DC
  Administered 2015-03-24 – 2015-03-26 (×5): 400 mg via ORAL
  Filled 2015-03-24 (×5): qty 1

## 2015-03-24 MED ORDER — MAGNESIUM SULFATE 4 GM/100ML IV SOLN
4.0000 g | Freq: Once | INTRAVENOUS | Status: AC
  Administered 2015-03-24: 4 g via INTRAVENOUS
  Filled 2015-03-24: qty 100

## 2015-03-24 MED ORDER — NICOTINE 10 MG IN INHA
1.0000 | RESPIRATORY_TRACT | Status: DC | PRN
  Administered 2015-03-26: 1 via RESPIRATORY_TRACT
  Filled 2015-03-24: qty 36

## 2015-03-24 MED ORDER — IPRATROPIUM-ALBUTEROL 0.5-2.5 (3) MG/3ML IN SOLN
3.0000 mL | Freq: Once | RESPIRATORY_TRACT | Status: AC
  Administered 2015-03-24: 3 mL via RESPIRATORY_TRACT

## 2015-03-24 MED ORDER — ACETAMINOPHEN 325 MG PO TABS: 650.0000 mg | ORAL_TABLET | Freq: Four times a day (QID) | ORAL | Status: DC | PRN

## 2015-03-24 MED ORDER — PANTOPRAZOLE SODIUM 40 MG PO TBEC
40.0000 mg | DELAYED_RELEASE_TABLET | Freq: Every day | ORAL | Status: DC
  Administered 2015-03-24 – 2015-03-26 (×3): 40 mg via ORAL
  Filled 2015-03-24 (×3): qty 1

## 2015-03-24 MED ORDER — SODIUM CHLORIDE 0.9 % IV BOLUS (SEPSIS)
1000.0000 mL | Freq: Once | INTRAVENOUS | Status: AC
  Administered 2015-03-24: 1000 mL via INTRAVENOUS

## 2015-03-24 MED ORDER — SODIUM CHLORIDE 0.9 % IJ SOLN
3.0000 mL | Freq: Two times a day (BID) | INTRAMUSCULAR | Status: DC
  Administered 2015-03-24 – 2015-03-26 (×4): 3 mL via INTRAVENOUS

## 2015-03-24 MED ORDER — POTASSIUM CHLORIDE CRYS ER 20 MEQ PO TBCR
EXTENDED_RELEASE_TABLET | ORAL | Status: AC
  Filled 2015-03-24: qty 2

## 2015-03-24 MED ORDER — IPRATROPIUM-ALBUTEROL 0.5-2.5 (3) MG/3ML IN SOLN
RESPIRATORY_TRACT | Status: AC
  Filled 2015-03-24: qty 9

## 2015-03-24 MED ORDER — NYSTATIN 100000 UNIT/ML MT SUSP
5.0000 mL | Freq: Four times a day (QID) | OROMUCOSAL | Status: DC
  Administered 2015-03-24 – 2015-03-26 (×5): 500000 [IU] via OROMUCOSAL
  Filled 2015-03-24 (×5): qty 5

## 2015-03-24 MED ORDER — BUPRENORPHINE HCL 2 MG SL SUBL
2.0000 mg | SUBLINGUAL_TABLET | Freq: Every day | SUBLINGUAL | Status: DC
  Administered 2015-03-24 – 2015-03-26 (×3): 2 mg via SUBLINGUAL
  Filled 2015-03-24 (×3): qty 1

## 2015-03-24 MED ORDER — METHYLPREDNISOLONE SODIUM SUCC 125 MG IJ SOLR
INTRAMUSCULAR | Status: AC
  Administered 2015-03-24: 125 mg via INTRAVENOUS
  Filled 2015-03-24: qty 2

## 2015-03-24 MED ORDER — IPRATROPIUM-ALBUTEROL 0.5-2.5 (3) MG/3ML IN SOLN: 3.0000 mL | Freq: Once | RESPIRATORY_TRACT | Status: AC

## 2015-03-24 MED ORDER — ACETAMINOPHEN 650 MG RE SUPP: 650.0000 mg | Freq: Four times a day (QID) | RECTAL | Status: DC | PRN

## 2015-03-24 MED ORDER — OXYCODONE HCL 5 MG PO TABS
5.0000 mg | ORAL_TABLET | Freq: Four times a day (QID) | ORAL | Status: DC | PRN
Start: 2015-03-24 — End: 2015-03-26
  Administered 2015-03-24: 16:00:00 5 mg via ORAL
  Filled 2015-03-24: qty 1

## 2015-03-24 MED ORDER — ALBUTEROL SULFATE (2.5 MG/3ML) 0.083% IN NEBU: 2.5000 mg | INHALATION_SOLUTION | RESPIRATORY_TRACT | Status: DC | PRN

## 2015-03-24 MED ORDER — VITAMIN B-1 100 MG PO TABS
100.0000 mg | ORAL_TABLET | Freq: Every day | ORAL | Status: DC
  Administered 2015-03-24 – 2015-03-26 (×3): 100 mg via ORAL
  Filled 2015-03-24 (×3): qty 1

## 2015-03-24 MED ORDER — QUETIAPINE FUMARATE 200 MG PO TABS
200.0000 mg | ORAL_TABLET | Freq: Every day | ORAL | Status: DC
Start: 2015-03-24 — End: 2015-03-26
  Administered 2015-03-24 – 2015-03-25 (×2): 200 mg via ORAL
  Filled 2015-03-24 (×2): qty 1

## 2015-03-24 MED ORDER — KETOCONAZOLE 2 % EX CREA
1.0000 "application " | TOPICAL_CREAM | Freq: Two times a day (BID) | CUTANEOUS | Status: DC
  Administered 2015-03-24 – 2015-03-26 (×5): 1 via TOPICAL
  Filled 2015-03-24: qty 15

## 2015-03-24 MED ORDER — BUDESONIDE-FORMOTEROL FUMARATE 160-4.5 MCG/ACT IN AERO
2.0000 | INHALATION_SPRAY | Freq: Two times a day (BID) | RESPIRATORY_TRACT | Status: DC
  Administered 2015-03-24 – 2015-03-26 (×3): 2 via RESPIRATORY_TRACT
  Filled 2015-03-24 (×2): qty 6

## 2015-03-24 MED ORDER — CEFTRIAXONE SODIUM IN DEXTROSE 20 MG/ML IV SOLN
1.0000 g | Freq: Once | INTRAVENOUS | Status: AC
  Administered 2015-03-24: 1 g via INTRAVENOUS

## 2015-03-24 MED ORDER — ALBUTEROL SULFATE (2.5 MG/3ML) 0.083% IN NEBU
2.5000 mg | INHALATION_SOLUTION | Freq: Once | RESPIRATORY_TRACT | Status: DC
Start: 2015-03-24 — End: 2015-03-24

## 2015-03-24 MED ORDER — FLUTICASONE PROPIONATE 50 MCG/ACT NA SUSP
1.0000 | Freq: Every day | NASAL | Status: DC
  Administered 2015-03-25 – 2015-03-26 (×2): 1 via NASAL
  Filled 2015-03-24: qty 16

## 2015-03-24 MED ORDER — OLOPATADINE HCL 0.1 % OP SOLN
1.0000 [drp] | Freq: Two times a day (BID) | OPHTHALMIC | Status: DC
  Administered 2015-03-24 – 2015-03-26 (×5): 1 [drp] via OPHTHALMIC
  Filled 2015-03-24: qty 5

## 2015-03-24 MED ORDER — CYCLOBENZAPRINE HCL 10 MG PO TABS
10.0000 mg | ORAL_TABLET | Freq: Two times a day (BID) | ORAL | Status: DC
  Administered 2015-03-24 – 2015-03-26 (×5): 10 mg via ORAL
  Filled 2015-03-24 (×5): qty 1

## 2015-03-24 MED ORDER — ESTROGENS CONJUGATED 0.625 MG PO TABS
0.6250 mg | ORAL_TABLET | Freq: Every day | ORAL | Status: DC
  Administered 2015-03-24 – 2015-03-26 (×3): 0.625 mg via ORAL
  Filled 2015-03-24 (×3): qty 1

## 2015-03-24 MED ORDER — METHYLPREDNISOLONE SODIUM SUCC 125 MG IJ SOLR
125.0000 mg | Freq: Once | INTRAMUSCULAR | Status: AC
  Administered 2015-03-24: 125 mg via INTRAVENOUS

## 2015-03-24 MED ORDER — HYDROXYZINE HCL 50 MG PO TABS
50.0000 mg | ORAL_TABLET | Freq: Four times a day (QID) | ORAL | Status: DC
  Administered 2015-03-24 – 2015-03-26 (×5): 50 mg via ORAL
  Filled 2015-03-24 (×10): qty 1

## 2015-03-24 MED ORDER — DEXTROSE 5 % IV SOLN
500.0000 mg | Freq: Once | INTRAVENOUS | Status: AC
  Administered 2015-03-24: 17:00:00 500 mg via INTRAVENOUS
  Filled 2015-03-24: qty 500

## 2015-03-24 MED ORDER — AMPHETAMINE-DEXTROAMPHETAMINE 5 MG PO TABS
10.0000 mg | ORAL_TABLET | Freq: Every day | ORAL | Status: DC
  Administered 2015-03-24 – 2015-03-26 (×3): 10 mg via ORAL
  Filled 2015-03-24 (×3): qty 2

## 2015-03-24 NOTE — ED Notes (Signed)
Unable to draw blood for lab.  Multiple attempts tried.  Lab notified.
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
## 2015-03-24 NOTE — Plan of Care (Signed)
Problem: Discharge Progression Outcomes Goal: Discharge plan in place and appropriate Outcome: Progressing Patient goes by [name] Patient lives in apartment by self  Goal: Pain controlled with appropriate interventions Outcome: Progressing Patient c/o pain x1 controlled well with prn pain med Goal: Other Discharge Outcomes/Goals Outcome: Progressing Patients Vitals were WNL No C/O SOB, O2 sats of 93% on room air Patient continues on IV ABX and received replacement Mag and Potassium  Patient is tolerating diet well, ABD ultrasound scheduled in the AM Patient is a stand by assist to the [facility]

## 2015-03-24 NOTE — Progress Notes (Signed)
On call MD, Dr. Lavetta Nielsen called to be notified of pt continued lethargy since arrival on the unit and multiple PO medications ordered at bedtime. Verbal order to hold Xanax and Vistaril for tonight but the others ordered are OK to give. MD also ordering ABG. Will continue to assess.   [name] [employer]

## 2015-03-24 NOTE — H&P (Signed)
[place] at [place] NAME: [name]    MR#:  [id number]  DATE OF BIRTH:  1971-04-04  DATE OF ADMISSION:  03/24/2015  PRIMARY CARE PHYSICIAN: Lorelee Market, MD   REQUESTING/REFERRING PHYSICIAN:   CHIEF COMPLAINT:   Chief Complaint  Patient presents with  . Chest Pain      HISTORY OF PRESENT ILLNESS: [name]  is a 44 y.o. female with a known history of asthma, COPD , smoking, also history of bipolar disorder and chronic pain syndrome who presents to the hospital with complaints of shortness of breath as well as chest pain. Pain was described as constant for the past going on for the past 4 days and increasing whenever she takes deep breath. She also admitted of coughing nonstop for the past 4 days. Also producing some phlegm, wheezing, her chest feels tight and her phlegm is green. She reports small amount of hemoptysis a week ago. The pain resolves whenever oxygen was initiated in the emergency room. On arrival to emergency room, her oxygen saturations were 86% on room air and improved on 2 L of oxygen through nasal cannula. Chest x-ray revealed bilateral pneumonia. Hospitalist services were contacted for admission. Patient has been given Rocephin as well as Zithromax in the emergency room as well as nebulizers.  PAST MEDICAL HISTORY:   Past Medical History  Diagnosis Date  . Bipolar 1 disorder   . Other abdominal hernia     disk disease  . PTSD (post-traumatic stress disorder)   . Leg fracture     PAST SURGICAL HISTORY:  Past Surgical History  Procedure Laterality Date  . Other surgical history      left tib fib repair  . Other surgical history      left tib fib repair, has boot on  . Fracture surgery      SOCIAL HISTORY:  History  Substance Use Topics  . Smoking status: Current Some Day Smoker -- 0.50 packs/day  . Smokeless tobacco: Not on file  . Alcohol Use: Not on file    FAMILY HISTORY: Shows  father has Parkinson's disease, hypertension as well as hyperlipidemia. Mother diabetes, bipolar disorder and other psychiatric disorders.   DRUG ALLERGIES:  Allergies  Allergen Reactions  . Sulfa Antibiotics Shortness Of Breath  . Coconut Oil Swelling  . Other     Steroids, causes hallunications    Review of Systems  Constitutional: Positive for chills. Negative for fever, weight loss and malaise/fatigue.  HENT: Negative for congestion.   Eyes: Negative for blurred vision and double vision.  Respiratory: Positive for cough, hemoptysis, sputum production, shortness of breath and wheezing.   Cardiovascular: Positive for chest pain and palpitations. Negative for orthopnea, leg swelling and PND.  Gastrointestinal: Positive for nausea. Negative for vomiting, abdominal pain, diarrhea, constipation, blood in stool and melena.  Genitourinary: Negative for dysuria, urgency, frequency and hematuria.  Musculoskeletal: Positive for myalgias. Negative for falls.  Skin: Negative for rash.  Neurological: Negative for dizziness and weakness.  Psychiatric/Behavioral: Negative for depression and memory loss. The patient is not nervous/anxious.     MEDICATIONS AT HOME:  Prior to Admission medications   Not on File      PHYSICAL EXAMINATION:   VITAL SIGNS: Blood pressure 118/71, pulse 98, resp. rate 20, height 5\' 3"  (1.6 m), weight 81.647 kg (180 lb), last menstrual period 03/20/2015, SpO2 100 %.  GENERAL:  44 y.o.-year-old patient lying in the bed with no acute distress.  Very somnolent and has difficulty even opening her eyes and converse. Blames inability to sleep over the past few days. Groggy EYES: Pupils equal, round, reactive to light and accommodation. No scleral icterus. Extraocular muscles intact.  HEENT: Head atraumatic, normocephalic. Oropharynx and nasopharynx clear.  NECK:  Supple, no jugular venous distention. No thyroid enlargement, no tenderness.  LUNGS: Normal breath sounds  bilaterally, scattered wheezing and rales,rhonchi , some crepitations posteriorly on the left. Uses accessory muscles of respiration special when exerts himself or talks for prolonged period of time. Tachypneic and dyspneic CARDIOVASCULAR: S1, S2 normal. No murmurs, rubs, or gallops. Distant ABDOMEN: Soft, nontender, nondistended. Bowel sounds present. No organomegaly or mass.  EXTREMITIES: No pedal edema, cyanosis, or clubbing.  NEUROLOGIC: Cranial nerves II through XII are intact. Muscle strength 5/5 in all extremities. Sensation intact. Gait not checked.  PSYCHIATRIC: The patient is somnolent and groggy , but oriented x 3. Speech is fast. No good eye contact SKIN: No obvious rash, lesion, or ulcer.   LABORATORY PANEL:   CBC  Recent Labs Lab 03/24/15 1024  WBC 6.8  HGB 11.6*  HCT 33.7*  PLT 81*  MCV 92.7  MCH 31.9  MCHC 34.4  RDW 14.9*  LYMPHSABS 1.0  MONOABS 0.2  EOSABS 0.2  BASOSABS 0.0   ------------------------------------------------------------------------------------------------------------------  Chemistries   Recent Labs Lab 03/24/15 1024  NA 132*  K 2.5*  CL 102  CO2 26  GLUCOSE 137*  BUN 15  CREATININE 0.76  CALCIUM 7.7*  AST 112*  ALT 63*  ALKPHOS 163*  BILITOT 1.8*   ------------------------------------------------------------------------------------------------------------------  Cardiac Enzymes  Recent Labs Lab 03/24/15 1024  TROPONINI <0.03   ------------------------------------------------------------------------------------------------------------------  RADIOLOGY: Dg Chest 2 View  03/24/2015   CLINICAL DATA:  44 year old female with chest pain tightness and cough increasing for 4 days. Initial encounter.  EXAM: CHEST  2 VIEW  COMPARISON:  02/21/2015 and earlier.  FINDINGS: New patchy bilateral pulmonary opacity with basilar predominance. No pleural effusion. Mildly lower lung volumes. No pneumothorax or pulmonary edema. Normal cardiac  size and mediastinal contours. Visualized tracheal air column is within normal limits. Stable cholecystectomy clips. No acute osseous abnormality identified.  IMPRESSION: Acute patchy bilateral pulmonary opacity most suggestive of bilateral bronchopneumonia. No pleural effusion.   Electronically Signed   By: Genevie Ann M.D.   On: 03/24/2015 10:49    EKG: Orders placed or performed during the hospital encounter of 03/24/15  . ED EKG  . ED EKG   EGD revealed normal sinus rhythm at 89 bpm, nonspecific ST changes. Prolonged QTC to 493 ms  IMPRESSION AND PLAN:  Principal Problem:   Bilateral pneumonia Active Problems:   Acute respiratory failure   Chronic pain   Left tibial fracture   Pneumonia  1 Acute respiratory failure with hypoxia. Admit patient to medical floor. Continue oxygen therapy, weaning her off to room air 2. Left shoulder pneumonia. Get sputum cultures if possible as well as blood cultures and continue patient on Rocephin and Zithromax  3. COPD, asthma exacerbation. Continue steroids, antibiotics therapy as well as inhalation therapy. Follow clinically 4. Hypokalemia. Get magnesium level, supplement orally as well as IV 5. Tobacco abuse. Discussed this patient for approximately 4 minutes. Nicotine replacement therapy will be initiated for which she is agreeable  All the records are reviewed and case discussed with ED provider. Management plans discussed with the patient, family and they are in agreement.  CODE STATUS:    TOTAL TIME TAKING CARE OF THIS PATIENT: 55 minutes.  Theodoro Grist M.D on 03/24/2015 at 12:33 PM  Between 7am to 6pm - Pager - [phone] After 6pm go to [email or web address] - password [email or web address]  Office  [phone]  CC: Primary care physician; Lorelee Market, MD

## 2015-03-24 NOTE — ED Provider Notes (Signed)
[facility] Emergency Department Provider Note  ____________________________________________  Time seen: Approximately 10:14 AM  I have reviewed the triage vital signs and the nursing notes.   HISTORY  Chief Complaint Chest Pain    HPI [name] is a 44 y.o. female with anxiety, chronic pain, bipolar disorder, PTSD,  Asthma who presents for evaluation of 4 day constant waxing/waning chest pain, gradual in onset, worse with movement and palpation, described as aching. Patient reports that she was lifting heavy boxes during a move 4 days ago when the chest pain started. Chest pain is not worsened with exertion, is not worse with inspiration. 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 She also reports that she has intermittently had cough productive of whitish phlegm although "there has been some brown stuff and a little blood last week". She feels her symptoms are related to bronchitis as she has had chest pain and cough like this in the past. EMS gave her 324 mg of aspirin as well as nitroglycerin paste. Currently her symptoms have resolved. No fevers, chills, vomiting or diarrhea. She has chronic left leg pain related to tib-fib fracture which was repaired with hardware but no history of DVT or PE. No history of coronary artery disease.   Past Medical History  Diagnosis Date  . Bipolar 1 disorder   . Other abdominal hernia     disk disease  . PTSD (post-traumatic stress disorder)   . Leg fracture     There are no active problems to display for this patient.   Past Surgical History  Procedure Laterality Date  . Other surgical history      left tib fib repair  . Other surgical history      left tib fib repair, has boot on  . Fracture surgery      No current outpatient prescriptions on file.  Allergies Sulfa antibiotics; Coconut oil; and Other  No family history on file.  Social History History   Substance Use Topics  . Smoking status: Current Some Day Smoker -- 0.50 packs/day  . Smokeless tobacco: Not on file  . Alcohol Use: Not on file    Review of Systems Constitutional: No fever/chills Eyes: No visual changes. ENT: No sore throat. Cardiovascular: + chest pain. Respiratory: + shortness of breath. Gastrointestinal: No abdominal pain.  No nausea, no vomiting.  No diarrhea.  No constipation. Genitourinary: Negative for dysuria. Musculoskeletal: Negative for back pain. Skin: Negative for rash. Neurological: Negative for headaches, focal weakness or numbness.  10-point ROS otherwise negative.  ____________________________________________   PHYSICAL EXAM:  VITAL SIGNS: ED Triage Vitals  Enc Vitals Group     BP --      Pulse --      Resp --      Temp --      Temp src --      SpO2 --      Weight --      Height --      Head Cir --      Peak Flow --      Pain Score --      Pain Loc --      Pain Edu? --      Excl. in [place]? --     Constitutional: Awake, oriented, sleepy but awakens to voice and answers questions appropriately and follows commands, in no acute distress. Eyes: Conjunctivae are normal. PERRL. EOMI. Head: Atraumatic. Nose: No congestion/rhinnorhea. Mouth/Throat:  Mucous membranes are moist.  Oropharynx non-erythematous. Neck: No stridor.  Cardiovascular: Normal rate, regular rhythm. Grossly normal heart sounds.  Good peripheral circulation. Respiratory: Normal respiratory effort.  No retractions. Faint expiratory wheeze throughout. Gastrointestinal: Soft and nontender. No distention. No abdominal bruits. No CVA tenderness. Genitourinary: deferred Musculoskeletal: No lower extremity tenderness nor edema.  No joint effusions. Tenderness to palpation throughout the left anterior chest wall, palpation reproduces pain. Neurologic:  Normal speech and language. No gross focal neurologic deficits are appreciated. Speech is normal. No gait instability. Skin:   Skin is warm, dry and intact. No rash noted. Psychiatric: Mood and affect are normal. Speech and behavior are normal.  ____________________________________________   LABS (all labs ordered are listed, but only abnormal results are displayed)  Labs Reviewed  CBC WITH DIFFERENTIAL/PLATELET - Abnormal; Notable for the following:    RBC 3.64 (*)    Hemoglobin 11.6 (*)    HCT 33.7 (*)    RDW 14.9 (*)    Platelets 81 (*)    All other components within normal limits  COMPREHENSIVE METABOLIC PANEL  TROPONIN I  URINE DRUG SCREEN, QUALITATIVE ([facility] ONLY)  FIBRIN DERIVATIVES D-DIMER ([facility] ONLY)  POC URINE PREG, ED   ____________________________________________  EKG  ED ECG REPORT I, Joanne Gavel, the attending physician, personally viewed and interpreted this ECG.   Date: 03/24/2015  EKG Time: 10:15  Rate: 89  Rhythm: normal sinus rhythm  Axis: normal  Intervals:none  ST&T Change: Nonspecific T-wave flattening in lead 3, anterolateral leads, no acute ST segment elevation  ____________________________________________  RADIOLOGY  CXR  FINDINGS: New patchy bilateral pulmonary opacity with basilar predominance. No pleural effusion. Mildly lower lung volumes. No pneumothorax or pulmonary edema. Normal cardiac size and mediastinal contours. Visualized tracheal air column is within normal limits. Stable cholecystectomy clips. No acute osseous abnormality identified.  IMPRESSION: Acute patchy bilateral pulmonary opacity most suggestive of bilateral bronchopneumonia. No pleural effusion. ____________________________________________   PROCEDURES  Procedure(s) performed: None  Critical Care performed: No  ____________________________________________   INITIAL IMPRESSION / ASSESSMENT AND PLAN / ED COURSE  Pertinent labs & imaging results that were available during my care of the patient were reviewed by me and considered in my medical decision making (see chart for  details).  [name] is a 44 y.o. female with anxiety, chronic pain, bipolar disorder, PTSD, asthma who presents for evaluation of 4 day constant waxing/waning chest pain, gradual in onset, worse with movement and palpation, described as aching. On arrival to the ER she was noted to be mildly hypoxic with wheeze. Will obtain basic labs, chest x-ray, basic cardiac panel and give Solu-Medrol and nebulizer treatments as this may represent bronchitis/asthma flare.  ----------------------------------------- 12:26 PM on 03/24/2015 -----------------------------------------  Chest x-ray consistent with pneumonia bilaterally. Ceftriaxone and azithromycin given IV for treatment of community acquired pneumonia. Hypoxic to 86%, improved to 100% on 2-4 L oxygen via nasal cannula. Discussed with hospitalist for admission given new oxygen requirement. Potassium low at 2.5, we'll replete. ____________________________________________   FINAL CLINICAL IMPRESSION(S) / ED DIAGNOSES  Final diagnoses:  Chest pain, unspecified chest pain type      Joanne Gavel, MD 03/24/15 1227

## 2015-03-25 ENCOUNTER — Inpatient Hospital Stay: Payer: Medicaid Other

## 2015-03-25 LAB — CBC
HCT: 31.8 % — ABNORMAL LOW (ref 35.0–47.0)
HEMOGLOBIN: 11 g/dL — AB (ref 12.0–16.0)
MCH: 32.1 pg (ref 26.0–34.0)
MCHC: 34.7 g/dL (ref 32.0–36.0)
MCV: 92.6 fL (ref 80.0–100.0)
Platelets: 70 10*3/uL — ABNORMAL LOW (ref 150–440)
RBC: 3.44 MIL/uL — ABNORMAL LOW (ref 3.80–5.20)
RDW: 14.7 % — AB (ref 11.5–14.5)
WBC: 6 10*3/uL (ref 3.6–11.0)

## 2015-03-25 LAB — MAGNESIUM: MAGNESIUM: 2 mg/dL (ref 1.7–2.4)

## 2015-03-25 LAB — COMPREHENSIVE METABOLIC PANEL
ALBUMIN: 2.8 g/dL — AB (ref 3.5–5.0)
ALT: 60 U/L — AB (ref 14–54)
AST: 75 U/L — ABNORMAL HIGH (ref 15–41)
Alkaline Phosphatase: 163 U/L — ABNORMAL HIGH (ref 38–126)
Anion gap: 7 (ref 5–15)
BUN: 13 mg/dL (ref 6–20)
CALCIUM: 8.6 mg/dL — AB (ref 8.9–10.3)
CO2: 26 mmol/L (ref 22–32)
CREATININE: 0.85 mg/dL (ref 0.44–1.00)
Chloride: 106 mmol/L (ref 101–111)
GFR calc non Af Amer: >60 mL/min (ref >60)
Glucose, Bld: 265 mg/dL — ABNORMAL HIGH (ref 65–99)
Potassium: 2.1 mmol/L — CL (ref 3.5–5.1)
Sodium: 139 mmol/L (ref 135–145)
Total Bilirubin: 0.8 mg/dL (ref 0.3–1.2)
Total Protein: 6.3 g/dL — ABNORMAL LOW (ref 6.5–8.1)

## 2015-03-25 LAB — POTASSIUM: Potassium: 3.2 mmol/L — ABNORMAL LOW (ref 3.5–5.1)

## 2015-03-25 LAB — TROPONIN I

## 2015-03-25 MED ORDER — METHYLPREDNISOLONE SODIUM SUCC 125 MG IJ SOLR
60.0000 mg | Freq: Two times a day (BID) | INTRAMUSCULAR | Status: DC
  Administered 2015-03-25 – 2015-03-26 (×2): 60 mg via INTRAVENOUS
  Filled 2015-03-25 (×2): qty 2

## 2015-03-25 MED ORDER — POTASSIUM CHLORIDE 10 MEQ/100ML IV SOLN
10.0000 meq | INTRAVENOUS | Status: AC
  Administered 2015-03-25 (×2): 10 meq via INTRAVENOUS
  Filled 2015-03-25 (×2): qty 100

## 2015-03-25 MED ORDER — POTASSIUM CHLORIDE CRYS ER 20 MEQ PO TBCR
40.0000 meq | EXTENDED_RELEASE_TABLET | Freq: Once | ORAL | Status: AC
  Administered 2015-03-25: 14:00:00 40 meq via ORAL
  Filled 2015-03-25: qty 2

## 2015-03-25 MED ORDER — ONDANSETRON HCL 4 MG/2ML IJ SOLN
4.0000 mg | Freq: Four times a day (QID) | INTRAMUSCULAR | Status: DC | PRN
Start: 2015-03-25 — End: 2015-03-26
  Administered 2015-03-25: 4 mg via INTRAVENOUS
  Filled 2015-03-25: qty 2

## 2015-03-25 MED ORDER — LEVOFLOXACIN 500 MG PO TABS
500.0000 mg | ORAL_TABLET | Freq: Every day | ORAL | Status: DC
  Administered 2015-03-25: 500 mg via ORAL
  Filled 2015-03-25: qty 1

## 2015-03-25 MED ORDER — POTASSIUM CHLORIDE 10 MEQ/100ML IV SOLN
10.0000 meq | INTRAVENOUS | Status: AC
  Administered 2015-03-25 (×4): 10 meq via INTRAVENOUS
  Filled 2015-03-25 (×4): qty 100

## 2015-03-25 NOTE — Progress Notes (Signed)
On call MD notified pt nauseous and no medication ordered to help. Dr. Jannifer Franklin to place new order.   [name] [name]

## 2015-03-25 NOTE — Progress Notes (Addendum)
[place] at [place] NAME: [name]    MR#:  [id number]  DATE OF BIRTH:  [birth year]  SUBJECTIVE:  CHIEF COMPLAINT:   Chief Complaint  Patient presents with  . Chest Pain   very lethargic, was asleep while talking. Wanting to eat as she is NPO for [place] REVIEW OF SYSTEMS:  Review of Systems  Constitutional: Negative for fever, weight loss, malaise/fatigue and diaphoresis.  HENT: Negative for ear discharge, ear pain, hearing loss, nosebleeds, sore throat and tinnitus.   Eyes: Negative for blurred vision and pain.  Respiratory: Negative for cough, hemoptysis, shortness of breath and wheezing.   Cardiovascular: Negative for chest pain, palpitations, orthopnea and leg swelling.  Gastrointestinal: Negative for heartburn, nausea, vomiting, abdominal pain, diarrhea, constipation and blood in stool.  Genitourinary: Negative for dysuria, urgency and frequency.  Musculoskeletal: Negative for myalgias and back pain.  Skin: Negative for itching and rash.  Neurological: Negative for dizziness, tingling, tremors, focal weakness, seizures, weakness and headaches.  Psychiatric/Behavioral: Negative for depression. The patient is not nervous/anxious.    DRUG ALLERGIES:   Allergies  Allergen Reactions  . Sulfa Antibiotics Shortness Of Breath  . Coconut Oil Swelling  . Other     Steroids, causes hallunications   VITALS:  Blood pressure 94/61, pulse 76, temperature 97.6 F (36.4 C), temperature source Oral, resp. rate 18, height 5\' 3"  (1.6 m), weight 84.114 kg (185 lb 7 oz), last menstrual period 03/20/2015, SpO2 91 %. PHYSICAL EXAMINATION:  Physical Exam  Constitutional: She is oriented to person, place, and time and well-developed, well-nourished, and in no distress. She appears lethargic. She appears unhealthy.  HENT:  Head: Normocephalic and atraumatic.  Eyes: Conjunctivae and EOM are normal. Pupils are equal, round, and reactive to  light.  Neck: Normal range of motion. Neck supple. No tracheal deviation present. No thyromegaly present.  Cardiovascular: Normal rate, regular rhythm and normal heart sounds.   Pulmonary/Chest: Effort normal and breath sounds normal. No respiratory distress. She has no wheezes. She exhibits no tenderness.  Abdominal: Soft. Bowel sounds are normal. She exhibits no distension. There is no tenderness.  Musculoskeletal: Normal range of motion.  Neurological: She is oriented to person, place, and time. She appears lethargic. No cranial nerve deficit.  Skin: Skin is warm and dry. No rash noted.  Psychiatric: Mood and affect normal.   LABORATORY PANEL:   CBC Recent Labs Lab 03/25/15 0149  WBC 6.0  HGB 11.0*  HCT 31.8*  PLT 70*   ------------------------------------------------------------------------------------------------------------------ Chemistries  Recent Labs Lab 03/24/15 1222 03/25/15 0149  NA  --  139  K  --  2.1*  CL  --  106  CO2  --  26  GLUCOSE  --  265*  BUN  --  13  CREATININE  --  0.85  CALCIUM  --  8.6*  MG 1.5*  --   AST  --  75*  ALT  --  60*  ALKPHOS  --  163*  BILITOT  --  0.8   ASSESSMENT AND PLAN:   1  Acute respiratory failure with hypoxia: Continue oxygen therapy, on room air now. 2. Left shoulder pneumonia. Get sputum cultures if possible, continue Rocephin and Zithromax  3. COPD, asthma exacerbation. Continue steroids, antibiotics therapy as well as inhalation therapy. 4. Hypokalemia: Replete and recheck, check mg 5. Elevated LFTs: [place] abd pending Tobacco abuse: Discussed this patient for approximately 4 minutes by admitting dr. On Nicotine replacement therapy  All the records are reviewed and case discussed with Care Management/Social Workerr. Management plans discussed with the patient, family and they are in agreement.  CODE STATUS: Full Code  TOTAL TIME TAKING CARE OF THIS PATIENT: 35 minutes.   More than 50% of the time was spent in  counseling/coordination of care: YES  POSSIBLE D/C IN 1-2 DAYS, DEPENDING ON CLINICAL CONDITION.   [facility], Colt Martelle M.D on 03/25/2015 at 11:16 AM  Between 7am to 6pm - Pager - [phone]  After 6pm go to [email or web address] - password [email or web address] [facility]  Office  [phone]  CC: Primary care physician; Lorelee Market, MD

## 2015-03-25 NOTE — Plan of Care (Signed)
Problem: Discharge Progression Outcomes Goal: Other Discharge Outcomes/Goals Plan of care progress to goal:  No complaints of pain. Patient remains on 2L-O2, requiring frequent reminders to keep on. Patient is very lethargic with slurred and mostly incomprehensible speech. Potassium and Magnesium replacement given - will recheck in AM. Implemented regular diet after [place] - tolerating well. Up to [facility] with assistance.  Abd. [place] resulted - changes consistent with hepatic cirrhosis.

## 2015-03-25 NOTE — Progress Notes (Signed)
Electrolyte CONSULT NOTE - INITIAL  Pharmacy Consult for Potassium Supplementation Indication: Hypokalemia  Allergies  Allergen Reactions  . Sulfa Antibiotics Shortness Of Breath  . Coconut Oil Swelling  . Other     Steroids, causes hallunications    Patient Measurements: Height: 5\' 3"  (160 cm) Weight: 185 lb 7 oz (84.114 kg) IBW/kg (Calculated) : 52.4    Vital Signs: Temp: 98.2 F (36.8 C) (06/22 2152) Temp Source: Oral (06/22 2152) BP: 110/76 mmHg (06/22 2152) Pulse Rate: 78 (06/22 2152) Intake/Output from previous day: 06/21 0701 - 06/22 0700 In: -  Out: 100 [Urine:100] Intake/Output from this shift:    Labs:  Recent Labs  03/24/15 1024 03/25/15 0149  WBC 6.8 6.0  HGB 11.6* 11.0*  HCT 33.7* 31.8*  PLT 81* 70*     Recent Labs  03/24/15 1024 03/24/15 1222 03/24/15 1818 03/25/15 0149 03/25/15 1151 03/25/15 1700  NA 132*  --   --  139  --   --   K 2.5*  --   --  2.1*  --  3.2*  CL 102  --   --  106  --   --   CO2 26  --   --  26  --   --   GLUCOSE 137*  --   --  265*  --   --   BUN 15  --   --  13  --   --   CREATININE 0.76  --   --  0.85  --   --   CALCIUM 7.7*  --   --  8.6*  --   --   MG  --  1.5*  --   --  2.0  --   PROT 6.0*  --   --  6.3*  --   --   ALBUMIN 3.0*  --   --  2.8*  --   --   AST 112*  --   --  75*  --   --   ALT 63*  --   --  60*  --   --   ALKPHOS 163*  --   --  163*  --   --   BILITOT 1.8*  --   --  0.8  --   --   BILIDIR  --   --  0.4  --   --   --    Estimated Creatinine Clearance: 86.8 mL/min (by C-G formula based on Cr of 0.85).   Medical History: Past Medical History  Diagnosis Date  . Bipolar 1 disorder   . Other abdominal hernia     disk disease  . PTSD (post-traumatic stress disorder)   . Leg fracture     Medications:  Scheduled:  . ALPRAZolam  2 mg Oral TID  . amphetamine-dextroamphetamine  10 mg Oral Daily  . budesonide-formoterol  2 puff Inhalation BID  . buprenorphine  2 mg Sublingual Daily  .  cyclobenzaprine  10 mg Oral Q12H  . estrogens (conjugated)  0.625 mg Oral Daily  . fluticasone  1 spray Each Nare Daily  . folic acid  1 mg Oral Daily  . furosemide  40 mg Oral Daily  . gabapentin  400 mg Oral TID  . hydrOXYzine  50 mg Oral QID  . ketoconazole  1 application Topical BID  . levofloxacin  500 mg Oral Daily  . loratadine  10 mg Oral Daily  . magnesium oxide  400 mg Oral BID  . methylPREDNISolone (SOLU-MEDROL) injection  60 mg Intravenous Q12H  . nystatin  5 mL Mouth/Throat QID  . olopatadine  1 drop Both Eyes BID  . pantoprazole  40 mg Oral Daily  . potassium chloride  10 mEq Intravenous Q1 Hr x 2  . QUEtiapine  200 mg Oral QHS  . sodium chloride  3 mL Intravenous Q12H  . thiamine  100 mg Oral Daily    Assessment: Patient is admitted for treatment of pneumonia.  Currently receiving therapy with Levaquin 500 mg po daily.  Pharmacy consulted to supplement potassium.  Potassium: 2.1, Magnesium: 2.0  Patient has received potassium chloride 40 meq IV once and an additional 40 meq oral dose.   Plan:  Will recheck a potassium level at 1700 to assess further supplementation requirements.  K + @ 17:00 = 3.2 Will order KCl IV 20 mEq X 1 and recheck K with AM labs.   Pharmacy will continue to follow.   Murrell Converse, PharmD Clinical Pharmacist 03/25/2015

## 2015-03-25 NOTE — Progress Notes (Signed)
Electrolyte CONSULT NOTE - INITIAL  Pharmacy Consult for Potassium Supplementation Indication: Hypokalemia  Allergies  Allergen Reactions  . Sulfa Antibiotics Shortness Of Breath  . Coconut Oil Swelling  . Other     Steroids, causes hallunications    Patient Measurements: Height: 5\' 3"  (160 cm) Weight: 185 lb 7 oz (84.114 kg) IBW/kg (Calculated) : 52.4    Vital Signs: Temp: 97.6 F (36.4 C) (06/22 1250) Temp Source: Oral (06/22 1250) BP: 105/68 mmHg (06/22 1250) Pulse Rate: 88 (06/22 1250) Intake/Output from previous day: 06/21 0701 - 06/22 0700 In: -  Out: 100 [Urine:100] Intake/Output from this shift: Total I/O In: -  Out: 500 [Urine:500]  Labs:  Recent Labs  03/24/15 1024 03/25/15 0149  WBC 6.8 6.0  HGB 11.6* 11.0*  HCT 33.7* 31.8*  PLT 81* 70*     Recent Labs  03/24/15 1024 03/24/15 1222 03/24/15 1818 03/25/15 0149 03/25/15 1151  NA 132*  --   --  139  --   K 2.5*  --   --  2.1*  --   CL 102  --   --  106  --   CO2 26  --   --  26  --   GLUCOSE 137*  --   --  265*  --   BUN 15  --   --  13  --   CREATININE 0.76  --   --  0.85  --   CALCIUM 7.7*  --   --  8.6*  --   MG  --  1.5*  --   --  2.0  PROT 6.0*  --   --  6.3*  --   ALBUMIN 3.0*  --   --  2.8*  --   AST 112*  --   --  75*  --   ALT 63*  --   --  60*  --   ALKPHOS 163*  --   --  163*  --   BILITOT 1.8*  --   --  0.8  --   BILIDIR  --   --  0.4  --   --    Estimated Creatinine Clearance: 86.8 mL/min (by C-G formula based on Cr of 0.85).   Medical History: Past Medical History  Diagnosis Date  . Bipolar 1 disorder   . Other abdominal hernia     disk disease  . PTSD (post-traumatic stress disorder)   . Leg fracture     Medications:  Scheduled:  . ALPRAZolam  2 mg Oral TID  . amphetamine-dextroamphetamine  10 mg Oral Daily  . budesonide-formoterol  2 puff Inhalation BID  . buprenorphine  2 mg Sublingual Daily  . cyclobenzaprine  10 mg Oral Q12H  . estrogens (conjugated)   0.625 mg Oral Daily  . fluticasone  1 spray Each Nare Daily  . folic acid  1 mg Oral Daily  . furosemide  40 mg Oral Daily  . gabapentin  400 mg Oral TID  . hydrOXYzine  50 mg Oral QID  . ipratropium-albuterol  3 mL Nebulization Q4H  . ketoconazole  1 application Topical BID  . levofloxacin  500 mg Oral Daily  . loratadine  10 mg Oral Daily  . magnesium oxide  400 mg Oral BID  . methylPREDNISolone (SOLU-MEDROL) injection  60 mg Intravenous Q12H  . nystatin  5 mL Mouth/Throat QID  . olopatadine  1 drop Both Eyes BID  . pantoprazole  40 mg Oral Daily  . potassium chloride  40 mEq Oral BID  . potassium chloride  40 mEq Oral Once  . QUEtiapine  200 mg Oral QHS  . sodium chloride  3 mL Intravenous Q12H  . thiamine  100 mg Oral Daily    Assessment: Patient is admitted for treatment of pneumonia.  Currently receiving therapy with Levaquin 500 mg po daily.  Pharmacy consulted to supplement potassium.  Potassium: 2.1, Magnesium: 2.0  Patient has received potassium chloride 40 meq IV once and an additional 40 meq oral dose.   Plan:  Will recheck a potassium level at 1700 to assess further supplementation requirements.   Pharmacy will continue to follow.   Murrell Converse, PharmD Clinical Pharmacist 03/25/2015

## 2015-03-25 NOTE — Progress Notes (Signed)
Patient had 2 open pill bottles in her purse at the bedside. Pills were spilled throughout the purse. Various types of pills present - appears to be what looks like controlled substances found. Pills were taken out of the patients room and escorted down to the pharmacy. MD notified. Madlyn Frankel, RN

## 2015-03-25 NOTE — Care Management (Signed)
Admitted to this facility with the diagnosis of pneumonia. Lives alone. Friend is Glenna Fellows [phone]). Sees Dr. Bernita Buffy.     Takes care of all basic needs herself.                                                                                                          NP)  Scheduled for ultrasound of the abdomen.  Shelbie Ammons RN MSN Care Management [phone]

## 2015-03-25 NOTE — Plan of Care (Signed)
Problem: Discharge Progression Outcomes Goal: Discharge plan in place and appropriate Individualization Pt prefers to be called [name].  Lives at home alone.  History of depression, bipolar, PTSD, asthma, COPD, chronic pain syndrome, controlled by home medications.  High fall risk, bed alarm on, hourly rounds made.   Goal: Other Discharge Outcomes/Goals Outcome: Progressing Plan of care progress to goal: Pain - pt has complained of no pain. Hemodynamically stable - vital signs stable, pt continues to require 2L of O2, pt states BP baseline is low Complications - pt very lethargic with slurred and incomprehensible speech at times, critical potassium of 2.1 being replaced by IV Diet - pt required frequent encouragement to wake up and eat, but is now NPO for upcoming ultrasound Activity - pt requires assistance to [facility]

## 2015-03-25 NOTE — Progress Notes (Signed)
Dr. Lavetta Nielsen notified of pt critical potassium 2.1. Telephone order for 35meq IV now. Order read back & verified.   Hiram Gash [employer]

## 2015-03-25 NOTE — Progress Notes (Signed)
Inpatient Diabetes Program Recommendations  AACE/ADA: New Consensus Statement on Inpatient Glycemic Control (2013)  Target Ranges:  Prepandial:   less than 140 mg/dL      Peak postprandial:   less than 180 mg/dL (1-2 hours)      Critically ill patients:  140 - 180 mg/dL    Results for CHASTIN, GARLITZ (MRN [id number]) as of 03/25/2015 08:58  Ref. Range 03/24/2015 12:26 03/25/2015 01:49  Hemoglobin A1C Latest Ref Range: 4.0-6.0 % 4.8   Glucose Latest Ref Range: 65-99 mg/dL  265 (H)   Diabetes history: No Outpatient Diabetes medications: NA Current orders for Inpatient glycemic control: None  Inpatient Diabetes Program Recommendations Correction (SSI): While inpatient and ordered steroids, please consider ordering CBGs with Novolog correction ACHS.  Thanks, Barnie Alderman, RN, MSN, CCRN, CDE Diabetes Coordinator Inpatient Diabetes Program [phone] (Team Pager from [place] to [place]) [phone] ([place] office) [phone] [facility] office) [phone] [facility] office)

## 2015-03-25 NOTE — Progress Notes (Signed)
   03/25/15 1320  Clinical Encounter Type  Visited With Patient  Visit Type Initial  Visited with patient in her room.  Pt's speech was extremely slurred and I was unable to understand her most of the time.  She also appeared to be sleepy and or groggy with her eyelids very heavy as I spoke with her.  She asked me to do something for her, but I did not understand what she was telling me.  I spoke to her nurse and let her know that pt may be wanting something from pastoral care but I was unable to figure out what that was.  Nurse said she would page me if I was needed and thanked me for visiting with the patient.  [place]

## 2015-03-26 LAB — BASIC METABOLIC PANEL
Anion gap: 5 (ref 5–15)
BUN: 21 mg/dL — AB (ref 6–20)
CO2: 26 mmol/L (ref 22–32)
Calcium: 8.7 mg/dL — ABNORMAL LOW (ref 8.9–10.3)
Chloride: 109 mmol/L (ref 101–111)
Creatinine, Ser: 0.92 mg/dL (ref 0.44–1.00)
GLUCOSE: 187 mg/dL — AB (ref 65–99)
Potassium: 4.4 mmol/L (ref 3.5–5.1)
Sodium: 140 mmol/L (ref 135–145)

## 2015-03-26 LAB — CULTURE, RESPIRATORY

## 2015-03-26 LAB — CULTURE, RESPIRATORY W GRAM STAIN

## 2015-03-26 LAB — MAGNESIUM: Magnesium: 1.8 mg/dL (ref 1.7–2.4)

## 2015-03-26 MED ORDER — LEVOFLOXACIN 500 MG PO TABS: 500.0000 mg | ORAL_TABLET | Freq: Every day | ORAL | Status: DC

## 2015-03-26 MED ORDER — FLUCONAZOLE 150 MG PO TABS: 150.0000 mg | ORAL_TABLET | Freq: Every day | ORAL | Status: DC

## 2015-03-26 NOTE — Progress Notes (Signed)
Dr Manuella Ghazi made aware of scab on pts right breast nipple, Dr Manuella Ghazi in pts room assessing pt

## 2015-03-26 NOTE — BHH Counselor (Signed)
Referral made for [facility] MD consult.

## 2015-03-26 NOTE — Discharge Instructions (Signed)
Mastitis  Mastitis is redness, soreness, and puffiness (inflammation) in an area of the breast. It is often caused by an infection that occurs when bacteria enter the skin. The infection is often helped by antibiotic medicine. HOME CARE  Only take medicines as told by your doctor.  If your doctor prescribed an antibiotic medicine, take it as told. Finish it even if you start to feel better.  Do not wear a tight or underwire bra. Wear a soft support bra.  Drink more fluids, especially if you have a fever.  If you are breastfeeding:  Keep emptying the breast. Your doctor can tell you if the milk is safe. Use a breast pump if you are told to stop nursing.  Keep your nipples clean and dry.  Empty the first breast before going to the other breast. Use a breast pump if your baby is not emptying your breast.  If you go back to work, pump your breasts while at work.  Avoid letting your breasts get overly filled with milk (engorged). GET HELP IF:  You have pus-like fluid leaking from your breast.  Your symptoms do not get better within 2 days. GET HELP RIGHT AWAY IF:  Your pain and puffiness are getting worse.  Your pain is not helped by medicine.  You have a red line going from your breast toward your armpit.  You have a fever or lasting symptoms for more than 2-3 days.  You have a fever and your symptoms suddenly get worse. MAKE SURE YOU:   Understand these instructions.  Will watch your condition.  Will get help right away if you are not doing well or get worse. Document Released: 09/07/2009 Document Revised: 05/22/2013 Document Reviewed: 04/19/2013 [facility] Patient Information 2015 [place], [place]. This information is not intended to replace advice given to you by your health care provider. Make sure you discuss any questions you have with your health care provider.  Pneumonia Pneumonia is an infection of the lungs.  CAUSES Pneumonia may be caused by bacteria or a  virus. Usually, these infections are caused by breathing infectious particles into the lungs (respiratory tract). SIGNS AND SYMPTOMS   Cough.  Fever.  Chest pain.  Increased rate of breathing.  Wheezing.  Mucus production. DIAGNOSIS  If you have the common symptoms of pneumonia, your health care provider will typically confirm the diagnosis with a chest X-ray. The X-ray will show an abnormality in the lung (pulmonary infiltrate) if you have pneumonia. Other tests of your blood, urine, or sputum may be done to find the specific cause of your pneumonia. Your health care provider may also do tests (blood gases or pulse oximetry) to see how well your lungs are working. TREATMENT  Some forms of pneumonia may be spread to other people when you cough or sneeze. You may be asked to wear a mask before and during your exam. Pneumonia that is caused by bacteria is treated with antibiotic medicine. Pneumonia that is caused by the influenza virus may be treated with an antiviral medicine. Most other viral infections must run their course. These infections will not respond to antibiotics.  HOME CARE INSTRUCTIONS   Cough suppressants may be used if you are losing too much rest. However, coughing protects you by clearing your lungs. You should avoid using cough suppressants if you can.  Your health care provider may have prescribed medicine if he or she thinks your pneumonia is caused by bacteria or influenza. Finish your medicine even if you start to  feel better.  Your health care provider may also prescribe an expectorant. This loosens the mucus to be coughed up.  Take medicines only as directed by your health care provider.  Do not smoke. Smoking is a common cause of bronchitis and can contribute to pneumonia. If you are a smoker and continue to smoke, your cough may last several weeks after your pneumonia has cleared.  A cold steam vaporizer or humidifier in your room or home may help loosen  mucus.  Coughing is often worse at night. Sleeping in a semi-upright position in a recliner or using a couple pillows under your head will help with this.  Get rest as you feel it is needed. Your body will usually let you know when you need to rest. PREVENTION A pneumococcal shot (vaccine) is available to prevent a common bacterial cause of pneumonia. This is usually suggested for:  People over 66 years old.  Patients on chemotherapy.  People with chronic lung problems, such as bronchitis or emphysema.  People with immune system problems. If you are over 65 or have a high risk condition, you may receive the pneumococcal vaccine if you have not received it before. In some countries, a routine influenza vaccine is also recommended. This vaccine can help prevent some cases of pneumonia.You may be offered the influenza vaccine as part of your care. If you smoke, it is time to quit. You may receive instructions on how to stop smoking. Your health care provider can provide medicines and counseling to help you quit. SEEK MEDICAL CARE IF: You have a fever. SEEK IMMEDIATE MEDICAL CARE IF:   Your illness becomes worse. This is especially true if you are elderly or weakened from any other disease.  You cannot control your cough with suppressants and are losing sleep.  You begin coughing up blood.  You develop pain which is getting worse or is uncontrolled with medicines.  Any of the symptoms which initially brought you in for treatment are getting worse rather than better.  You develop shortness of breath or chest pain. MAKE SURE YOU:   Understand these instructions.  Will watch your condition.  Will get help right away if you are not doing well or get worse. Document Released: 09/19/2005 Document Revised: 02/03/2014 Document Reviewed: 12/09/2010 [facility] Patient Information 2015 [place], [place]. This information is not intended to replace advice given to you by your health care  provider. Make sure you discuss any questions you have with your health care provider.

## 2015-03-26 NOTE — Progress Notes (Signed)
Pt being discharged home, discharge reviewed with pt, states understanding, home meds returned to pt from pharmacy, pt with no noted complaints at discharge

## 2015-03-26 NOTE — Consult Note (Signed)
  Psychiatry: Consult only received this morning. I was preparing to go see the patient but it appears that she has now been discharged.

## 2015-03-26 NOTE — Plan of Care (Signed)
Problem: Discharge Progression Outcomes Goal: Other Discharge Outcomes/Goals Plan of care progress to goals: 1. No c/o pain during the night  2. Hemodynamically:              -VSS, afebrile, remains on 2L O2 with stable sats but requires frequent reminders to keep on             -continues to be lethargic with slurred speech, disoriented to time when awake             -K 3.2, replacement given by IV 3. Tolerating regular diet  4. +1 assist to [facility]  5. Safety sitter at bedside due to pt impulsive, forgetful behavior despise frequent reminders

## 2015-03-29 LAB — CULTURE, BLOOD (ROUTINE X 2)
CULTURE: NO GROWTH
CULTURE: NO GROWTH
SPECIAL REQUESTS: NORMAL
Special Requests: NORMAL

## 2015-03-29 NOTE — Discharge Summary (Signed)
[place] [facility] [place] NAME: [name]    MR#:  [id number]  DATE OF BIRTH:  Oct 20, 1970  DATE OF ADMISSION:  03/24/2015 ADMITTING PHYSICIAN: Theodoro Grist, MD  DATE OF DISCHARGE: 03/26/2015  4:25 PM  PRIMARY CARE PHYSICIAN: Lorelee Market, MD    ADMISSION DIAGNOSIS:  Hypokalemia [E87.6] Hypoxia [R09.02] Bilateral pneumonia [J18.9] Chest pain, unspecified chest pain type [R07.9]  DISCHARGE DIAGNOSIS:  Principal Problem:   Bilateral pneumonia Active Problems:   Acute respiratory failure   Pneumonia   Left tibial fracture   Chronic pain   SECONDARY DIAGNOSIS:   Past Medical History  Diagnosis Date  . Bipolar 1 disorder   . Other abdominal hernia     disk disease  . PTSD (post-traumatic stress disorder)   . Leg fracture     HOSPITAL COURSE:  44 y.o. female with a known history of asthma, COPD , smoking, also history of bipolar disorder and chronic pain syndrome admited with complaints of shortness of breath as well as chest pain. Pain was described as constant for the past going on for the past 4 days and increasing whenever she takes deep breath. She also admitted of coughing nonstop for the past 4 days. Also producing some phlegm, wheezing, her chest feels tight and her phlegm is green. She reported small amount of hemoptysis a week ago. On arrival to emergency room, her oxygen saturations were 86% on room air and improved on 2 L of oxygen through nasal cannula. Chest x-ray revealed bilateral pneumonia. She was responding well to antibiotics and was back on room air saturating fine. She was feeling much better.   There was some concern that she might be abusing drugs of abuse in room as some meds were found under pillow and she was disproportionately lethargic on my exam which can explained by her use of not-prescribed medicine use while in the hospital. Overall she was feeling much better and was discharged ome on 6/23  in stable condition. DISCHARGE CONDITIONS:  Stable CONSULTS OBTAINED:  Treatment Team:  Gonzella Lex, MD  DRUG ALLERGIES:   Allergies  Allergen Reactions  . Sulfa Antibiotics Shortness Of Breath  . Coconut Oil Swelling  . Other     Steroids, causes hallunications    DISCHARGE MEDICATIONS:   Discharge Medication List as of 03/26/2015  2:41 PM    START taking these medications   Details  fluconazole (DIFLUCAN) 150 MG tablet Take 1 tablet (150 mg total) by mouth daily., Starting 03/26/2015, Until Discontinued, Normal    levofloxacin (LEVAQUIN) 500 MG tablet Take 1 tablet (500 mg total) by mouth daily., Starting 03/26/2015, Until Discontinued, Normal      CONTINUE these medications which have NOT CHANGED   Details  ALPRAZolam (XANAX) 0.5 MG tablet Take 2 mg by mouth 3 (three) times daily., Starting 03/24/2014, Until Discontinued, Historical Med    amphetamine-dextroamphetamine (ADDERALL) 10 MG tablet Take 10 mg by mouth daily., Until Discontinued, Historical Med    cetirizine (ZYRTEC) 10 MG tablet Take 10 mg by mouth daily. , Starting 03/12/2015, Until Discontinued, Historical Med    cyclobenzaprine (FLEXERIL) 10 MG tablet Take 10 mg by mouth every 12 (twelve) hours., Starting 03/16/2015, Until Discontinued, Historical Med    EPINEPHrine (EPIPEN 2-PAK) 0.3 mg/0.3 mL IJ SOAJ injection Inject 0.3 mg into the muscle as needed. , Starting 11/19/2014, Until Discontinued, Historical Med    furosemide (LASIX) 40 MG tablet Take 40 mg by mouth daily. , Starting  03/12/2015, Until Discontinued, Historical Med    gabapentin (NEURONTIN) 400 MG capsule Take 400 mg by mouth 3 (three) times daily. , Starting 03/12/2015, Until Discontinued, Historical Med    hydrOXYzine (VISTARIL) 50 MG capsule Take 1 capsule by mouth 4 (four) times daily., Starting 03/12/2015, Until Discontinued, Historical Med    ketoconazole (NIZORAL) 2 % cream Apply 1 application topically 2 (two) times daily., Starting 03/12/2015,  Until Discontinued, Historical Med    mometasone (NASONEX) 50 MCG/ACT nasal spray Place 2 sprays into the nose daily., Starting 03/12/2015, Until Discontinued, Historical Med    Olopatadine HCl (PATADAY) 0.2 % SOLN Place 1 drop into both eyes daily., Starting 10/15/2014, Until Discontinued, Historical Med    omeprazole (PRILOSEC) 40 MG capsule Take 40 mg by mouth daily. , Starting 03/12/2015, Until Discontinued, Historical Med    oxyCODONE (OXY IR/ROXICODONE) 5 MG immediate release tablet take 1 tablet by mouth every 4 to 6 hours AS NEEDED FOR PAIN., Historical Med    PREMARIN 0.625 MG tablet Take 0.625 mg by mouth daily. , Starting 03/12/2015, Until Discontinued, Historical Med    PROAIR HFA 108 (90 BASE) MCG/ACT inhaler Inhale 2 puffs into the lungs every 4 (four) hours as needed. , Starting 03/12/2015, Until Discontinued, Historical Med    QUEtiapine (SEROQUEL) 100 MG tablet Take 200 mg by mouth at bedtime., Until Discontinued, Historical Med    SUBOXONE 4-1 MG FILM Place 1 Film under the tongue every 12 (twelve) hours., Starting 03/16/2015, Until Discontinued, Historical Med    Vitamin D, Ergocalciferol, (DRISDOL) 50000 UNITS CAPS capsule Take 50,000 Units by mouth once a week., Starting 03/12/2015, Until Discontinued, Historical Med        DIET:  Regular diet  DISCHARGE CONDITION:  Good  ACTIVITY:  Activity as tolerated  OXYGEN:  Home Oxygen: No.   Oxygen Delivery: room air  DISCHARGE LOCATION:  home   If you experience worsening of your admission symptoms, develop shortness of breath, life threatening emergency, suicidal or homicidal thoughts you must seek medical attention immediately by calling 911 or calling your MD immediately  if symptoms less severe.  You Must read complete instructions/literature along with all the possible adverse reactions/side effects for all the Medicines you take and that have been prescribed to you. Take any new Medicines after you have completely  understood and accpet all the possible adverse reactions/side effects.   Please note  You were cared for by a hospitalist during your hospital stay. If you have any questions about your discharge medications or the care you received while you were in the hospital after you are discharged, you can call the unit and asked to speak with the hospitalist on call if the hospitalist that took care of you is not available. Once you are discharged, your primary care physician will handle any further medical issues. Please note that NO REFILLS for any discharge medications will be authorized once you are discharged, as it is imperative that you return to your primary care physician (or establish a relationship with a primary care physician if you do not have one) for your aftercare needs so that they can reassess your need for medications and monitor your lab values.   On the day of Discharge:  VITAL SIGNS:  Blood pressure 90/71, pulse 88, temperature 97.6 F (36.4 C), temperature source Axillary, resp. rate 16, height 5\' 3"  (1.6 m), weight 84.114 kg (185 lb 7 oz), last menstrual period 03/20/2015, SpO2 97 %. PHYSICAL EXAMINATION:  GENERAL:  44 y.o.-year-old  patient lying in the bed with no acute distress.  EYES: Pupils equal, round, reactive to light and accommodation. No scleral icterus. Extraocular muscles intact.  HEENT: Head atraumatic, normocephalic. Oropharynx and nasopharynx clear.  NECK:  Supple, no jugular venous distention. No thyroid enlargement, no tenderness.  LUNGS: Normal breath sounds bilaterally, no wheezing, rales,rhonchi or crepitation. No use of accessory muscles of respiration.  CARDIOVASCULAR: S1, S2 normal. No murmurs, rubs, or gallops.  ABDOMEN: Soft, non-tender, non-distended. Bowel sounds present. No organomegaly or mass.  EXTREMITIES: No pedal edema, cyanosis, or clubbing.  NEUROLOGIC: Cranial nerves II through XII are intact. Muscle strength 5/5 in all extremities. Sensation  intact. Gait not checked.  PSYCHIATRIC: The patient is alert and oriented x 3.  SKIN: No obvious rash, lesion, or ulcer.   DATA REVIEW:   CBC Recent Labs Lab 03/25/15 0149  WBC 6.0  HGB 11.0*  HCT 31.8*  PLT 70*    Chemistries  Recent Labs Lab 03/25/15 0149  03/26/15 0630  NA 139  --  140  K 2.1*  < > 4.4  CL 106  --  109  CO2 26  --  26  GLUCOSE 265*  --  187*  BUN 13  --  21*  CREATININE 0.85  --  0.92  CALCIUM 8.6*  --  8.7*  MG  --   < > 1.8  AST 75*  --   --   ALT 60*  --   --   ALKPHOS 163*  --   --   BILITOT 0.8  --   --   < > = values in this interval not displayed.  Microbiology Results: heavy growth of Staph Aureus - sensitive to Levaquin.   Management plans discussed with the patient, family and they are in agreement.  CODE STATUS: Full Code  TOTAL TIME TAKING CARE OF THIS PATIENT: 55 minutes.   She remains at very high risk for readmissions.   [facility], Jennavie Martinek M.D on 03/29/2015 at 1:46 PM  Between 7am to 6pm - Pager - [phone]  After 6pm go to [email or web address] - password [email or web address] [place] [facility]  Office  [phone]  CC: Primary care physician; Lorelee Market, MD

## 2015-03-31 ENCOUNTER — Inpatient Hospital Stay: Payer: Medicaid Other | Admitting: Oncology

## 2015-03-31 ENCOUNTER — Inpatient Hospital Stay: Payer: Medicaid Other

## 2015-04-01 ENCOUNTER — Emergency Department
Admission: EM | Admit: 2015-04-01 | Discharge: 2015-04-01 | Disposition: A | Discharge status: Home / Self Care | Payer: Medicaid Other | Attending: Emergency Medicine | Admitting: Emergency Medicine

## 2015-04-01 ENCOUNTER — Encounter: Payer: Self-pay | Admitting: Emergency Medicine

## 2015-04-01 ENCOUNTER — Emergency Department: Payer: Medicaid Other

## 2015-04-01 DIAGNOSIS — Z720 Tobacco use: Secondary | ICD-10-CM | POA: Insufficient documentation

## 2015-04-01 DIAGNOSIS — Z8701 Personal history of pneumonia (recurrent): Secondary | ICD-10-CM | POA: Diagnosis not present

## 2015-04-01 DIAGNOSIS — M79662 Pain in left lower leg: Secondary | ICD-10-CM | POA: Insufficient documentation

## 2015-04-01 DIAGNOSIS — R2242 Localized swelling, mass and lump, left lower limb: Secondary | ICD-10-CM | POA: Insufficient documentation

## 2015-04-01 DIAGNOSIS — R52 Pain, unspecified: Secondary | ICD-10-CM

## 2015-04-01 DIAGNOSIS — Z8781 Personal history of (healed) traumatic fracture: Secondary | ICD-10-CM | POA: Diagnosis not present

## 2015-04-01 DIAGNOSIS — Z8614 Personal history of Methicillin resistant Staphylococcus aureus infection: Secondary | ICD-10-CM | POA: Diagnosis not present

## 2015-04-01 DIAGNOSIS — M7989 Other specified soft tissue disorders: Secondary | ICD-10-CM

## 2015-04-01 MED ORDER — DOXYCYCLINE HYCLATE 100 MG PO TABS
ORAL_TABLET | ORAL | Status: AC
  Administered 2015-04-01: 100 mg via ORAL
  Filled 2015-04-01: qty 1

## 2015-04-01 MED ORDER — OXYCODONE HCL 5 MG PO TABS
10.0000 mg | ORAL_TABLET | Freq: Once | ORAL | Status: AC
  Administered 2015-04-01: 10 mg via ORAL

## 2015-04-01 MED ORDER — OXYCODONE HCL 5 MG PO TABS
ORAL_TABLET | ORAL | Status: AC
  Administered 2015-04-01: 10 mg via ORAL
  Filled 2015-04-01: qty 2

## 2015-04-01 MED ORDER — DOXYCYCLINE HYCLATE 100 MG PO TABS
100.0000 mg | ORAL_TABLET | Freq: Once | ORAL | Status: AC
  Administered 2015-04-01: 100 mg via ORAL

## 2015-04-01 MED ORDER — DOXYCYCLINE HYCLATE 50 MG PO CAPS: 100.0000 mg | ORAL_CAPSULE | Freq: Two times a day (BID) | ORAL | Status: DC

## 2015-04-01 NOTE — ED Notes (Signed)
Patient with no complaints at this time. Respirations even and unlabored. Skin warm/dry. Discharge instructions reviewed with patient at this time. Patient given opportunity to voice concerns/ask questions. Patient discharged at this time and left Emergency Department with steady gait.   

## 2015-04-01 NOTE — ED Notes (Signed)
C/o left foot pain and swelling, pt has 2 plus pitting edema to left lower ext., pt states she has had previous surgery to left lower leg and foot and also has had blood clots to LLE, no currently on any blood thinners, recently admitting to hospital with pneumonia, pt slurring her speech in triage

## 2015-04-01 NOTE — ED Notes (Signed)
Patient transported to X-ray 

## 2015-04-01 NOTE — ED Provider Notes (Signed)
[facility] Emergency Department Provider Note  ____________________________________________  Time seen: 2125  I have reviewed the triage vital signs and the nursing notes.   HISTORY  Chief Complaint Leg Pain     HPI [name] is a 44 y.o. female  presents to swelling and discomfort to her left leg. She does have a history of having a tibia fibula fracture earlier this year. She had her boot just removed about 6 weeks ago.  The patient was recently in the hospital for pneumonia. She reports the swelling has increased since then. She had told the nurses out front that she had a history of a blood clot. On further discussion with the patient, it turns out she had had an abscess that had to be packed, and the abscess had developed some blood clot in it. She does not have a history of a DVT or PE.  The patient does have a history of MRSA. She reports she has not had a lesion or an active infection and a number of months.  She also reports she has chronic back pain. She is waiting for her leg to improve more so she can have a back surgery. She does go to pain management. She reports her pain management doctor has prescribed Keflex for some of the erythema in her left leg. She is unhappy with pain management currently because they have put her on Suboxone. She prefers her 10 mg oxycodone's.   Past Medical History  Diagnosis Date  . Bipolar 1 disorder   . Other abdominal hernia     disk disease  . PTSD (post-traumatic stress disorder)   . Leg fracture     Patient Active Problem List   Diagnosis Date Noted  . Bilateral pneumonia 03/24/2015  . Acute respiratory failure 03/24/2015  . Pneumonia 03/24/2015  . Left tibial fracture 03/24/2015  . Chronic pain 03/24/2015    Past Surgical History  Procedure Laterality Date  . Other surgical history      left tib fib repair  . Other surgical history      left tib fib repair, has boot on  . Fracture  surgery      Current Outpatient Rx  Name  Route  Sig  Dispense  Refill  . ALPRAZolam (XANAX) 0.5 MG tablet   Oral   Take 2 mg by mouth 3 (three) times daily.         Marland Kitchen amphetamine-dextroamphetamine (ADDERALL) 10 MG tablet   Oral   Take 10 mg by mouth daily.         . cetirizine (ZYRTEC) 10 MG tablet   Oral   Take 10 mg by mouth daily.       0   . cyclobenzaprine (FLEXERIL) 10 MG tablet   Oral   Take 10 mg by mouth every 12 (twelve) hours.      0   . doxycycline (VIBRAMYCIN) 50 MG capsule   Oral   Take 2 capsules (100 mg total) by mouth 2 (two) times daily.   28 capsule   0   . EPINEPHrine (EPIPEN 2-PAK) 0.3 mg/0.3 mL IJ SOAJ injection   Intramuscular   Inject 0.3 mg into the muscle as needed.          . fluconazole (DIFLUCAN) 150 MG tablet   Oral   Take 1 tablet (150 mg total) by mouth daily.   6 tablet   0   . furosemide (LASIX) 40 MG tablet   Oral  Take 40 mg by mouth daily.       0   . gabapentin (NEURONTIN) 400 MG capsule   Oral   Take 400 mg by mouth 3 (three) times daily.       0   . hydrOXYzine (VISTARIL) 50 MG capsule   Oral   Take 1 capsule by mouth 4 (four) times daily.      0   . ketoconazole (NIZORAL) 2 % cream   Topical   Apply 1 application topically 2 (two) times daily.      0   . levofloxacin (LEVAQUIN) 500 MG tablet   Oral   Take 1 tablet (500 mg total) by mouth daily.   5 tablet   0   . mometasone (NASONEX) 50 MCG/ACT nasal spray   Nasal   Place 2 sprays into the nose daily.      0   . Olopatadine HCl (PATADAY) 0.2 % SOLN   Both Eyes   Place 1 drop into both eyes daily.         Marland Kitchen omeprazole (PRILOSEC) 40 MG capsule   Oral   Take 40 mg by mouth daily.       0   . oxyCODONE (OXY IR/ROXICODONE) 5 MG immediate release tablet      take 1 tablet by mouth every 4 to 6 hours AS NEEDED FOR PAIN.      0   . PREMARIN 0.625 MG tablet   Oral   Take 0.625 mg by mouth daily.       0     Dispense as  written.   Marland Kitchen PROAIR HFA 108 (90 BASE) MCG/ACT inhaler   Inhalation   Inhale 2 puffs into the lungs every 4 (four) hours as needed.       0     Dispense as written.   Marland Kitchen QUEtiapine (SEROQUEL) 100 MG tablet   Oral   Take 200 mg by mouth at bedtime.         . SUBOXONE 4-1 MG FILM   Sublingual   Place 1 Film under the tongue every 12 (twelve) hours.      0     Dispense as written.   . Vitamin D, Ergocalciferol, (DRISDOL) 50000 UNITS CAPS capsule   Oral   Take 50,000 Units by mouth once a week.      0     Allergies Sulfa antibiotics; Coconut oil; and Other  No family history on file.  Social History History  Substance Use Topics  . Smoking status: Current Some Day Smoker -- 0.50 packs/day for 12 years  . Smokeless tobacco: Not on file  . Alcohol Use: Not on file    Review of Systems  Constitutional: Negative for fever. ENT: Negative for sore throat. Cardiovascular: Negative for chest pain. Respiratory: Recent pneumonia. See history of present illness. Gastrointestinal: Negative for abdominal pain, vomiting and diarrhea. Genitourinary: Negative for dysuria. Musculoskeletal: Pain and swelling to the left leg. History of prior left leg fracture. CHPI. Skin: History of MRSA. She does have a lesion noted on exam on her abdomen. Her left leg has some erythema to it. Neurological: Negative for headaches   10-point ROS otherwise negative.  ____________________________________________   PHYSICAL EXAM:  VITAL SIGNS: ED Triage Vitals  Enc Vitals Group     BP 04/01/15 1831 109/67 mmHg     Pulse Rate 04/01/15 1831 94     Resp 04/01/15 1831 18     Temp 04/01/15 1831 98.3  F (36.8 C)     Temp Source 04/01/15 1831 Oral     SpO2 04/01/15 1831 100 %     Weight 04/01/15 1831 181 lb (82.101 kg)     Height 04/01/15 1831 5\' 2"  (1.575 m)     Head Cir --      Peak Flow --      Pain Score 04/01/15 1833 9     Pain Loc --      Pain Edu? --      Excl. in [place]? --      Constitutional: Alert, no acute distress, patient is a little bit hyperverbal and 10 gentle. ENT   Head: Normocephalic and atraumatic.   Nose: No congestion/rhinnorhea. Cardiovascular: Normal rate, regular rhythm, no murmur noted Respiratory:  Normal respiratory effort, no tachypnea.    Breath sounds are clear and equal bilaterally.  Gastrointestinal: Soft and nontender. No distention.  Back: No muscle spasm, no tenderness, no CVA tenderness. Musculoskeletal: The left leg does appear swollen with 1+ pitting edema. The foot especially looks a little puffy. There is minimal erythema noted near the ankle.  Neurologic:  Odd affect No gross focal neurologic deficits are appreciated.  Skin:  Skin is warm, dry. No rash noted. Slightly waxy appearance with the swelling in the left lower leg. Scant amount if any erythema in the left lower leg. She does have once Over lesion on her abdomen which would be worrisome for MRSA infection. Psychiatric: Slightly hyperverbal and tangential. The patient has an odd affect. Otherwise intact thought process. ____________________________________________   RADIOLOGY  Ultrasound left lower extremity: Negative for DVT.  X-ray left lower extremity: IMPRESSION: Healing tibial fracture with evidence of fixation. No acute abnormality is noted. The lucency seen posteriorly on the prior exam demonstrates increased callus formation  ____________________________________________   INITIAL IMPRESSION / ASSESSMENT AND PLAN / ED COURSE  Pertinent imaging results that were available during my care of the patient were reviewed by me and considered in my medical decision making (see chart for details).  Negative Doppler ultrasound. It is not clear at this time why the patient has notable swelling. Given her prior surgical history and history of MRSA. We will check an x-ray of the lower leg. Given the fact that she has a scabbed over lesion on her abdomen, I will  change her antibiotic from Keflex to doxycycline. I will ask her to follow-up with her regular doctor for ongoing care and for her pain management issues.  ----------------------------------------- 10:16 PM on 04/01/2015 -----------------------------------------  The x-ray does not show any acute abnormality. The patient is ambulatory in the emergency department without much distress.   I will change the antibiotic as mentioned above to doxycycline.   ____________________________________________   FINAL CLINICAL IMPRESSION(S) / ED DIAGNOSES  Final diagnoses:  Pain and swelling of left lower leg      Ahmed Prima, MD 04/01/15 2246

## 2015-04-01 NOTE — Discharge Instructions (Signed)
Your Doppler ultrasound of the left leg was negative for a blood clot. The x-ray did not show any new changes 44-year-old healing fracture. You do have a skin lesion on her abdomen that is worrisome for MRSA. With this in mind we would like to change her antibiotic from Keflex to doxycycline. Follow-up with your regular doctor. Minimized your time on your feet and allow the swelling of this left leg to go down. Return to the emergency department if you have worsening pain, redness, fever, or other urgent concerns.

## 2015-04-15 ENCOUNTER — Inpatient Hospital Stay (HOSPITAL_BASED_OUTPATIENT_CLINIC_OR_DEPARTMENT_OTHER): Payer: Medicaid Other | Admitting: Oncology

## 2015-04-15 ENCOUNTER — Inpatient Hospital Stay: Payer: Medicaid Other

## 2015-04-15 ENCOUNTER — Inpatient Hospital Stay: Payer: Medicaid Other | Attending: Oncology

## 2015-04-15 ENCOUNTER — Encounter: Payer: Self-pay | Admitting: Oncology

## 2015-04-15 ENCOUNTER — Inpatient Hospital Stay: Payer: Medicaid Other | Admitting: Oncology

## 2015-04-15 VITALS — BP 117/87 | HR 86 | Temp 97.8°F | Resp 20 | Wt 178.6 lb

## 2015-04-15 DIAGNOSIS — J45909 Unspecified asthma, uncomplicated: Secondary | ICD-10-CM | POA: Insufficient documentation

## 2015-04-15 DIAGNOSIS — F17210 Nicotine dependence, cigarettes, uncomplicated: Secondary | ICD-10-CM | POA: Diagnosis not present

## 2015-04-15 DIAGNOSIS — M797 Fibromyalgia: Secondary | ICD-10-CM | POA: Insufficient documentation

## 2015-04-15 DIAGNOSIS — F329 Major depressive disorder, single episode, unspecified: Secondary | ICD-10-CM | POA: Diagnosis not present

## 2015-04-15 DIAGNOSIS — J449 Chronic obstructive pulmonary disease, unspecified: Secondary | ICD-10-CM | POA: Insufficient documentation

## 2015-04-15 DIAGNOSIS — D696 Thrombocytopenia, unspecified: Secondary | ICD-10-CM | POA: Insufficient documentation

## 2015-04-15 DIAGNOSIS — M419 Scoliosis, unspecified: Secondary | ICD-10-CM | POA: Insufficient documentation

## 2015-04-15 DIAGNOSIS — F319 Bipolar disorder, unspecified: Secondary | ICD-10-CM | POA: Insufficient documentation

## 2015-04-15 DIAGNOSIS — Z79899 Other long term (current) drug therapy: Secondary | ICD-10-CM | POA: Insufficient documentation

## 2015-04-15 DIAGNOSIS — F4310 Post-traumatic stress disorder, unspecified: Secondary | ICD-10-CM | POA: Insufficient documentation

## 2015-04-15 DIAGNOSIS — K219 Gastro-esophageal reflux disease without esophagitis: Secondary | ICD-10-CM | POA: Insufficient documentation

## 2015-04-15 LAB — CBC WITH DIFFERENTIAL/PLATELET
BASOS ABS: 0 10*3/uL (ref 0–0.1)
Basophils Relative: 0 %
EOS ABS: 0.2 10*3/uL (ref 0–0.7)
Eosinophils Relative: 5 %
HEMATOCRIT: 40.8 % (ref 35.0–47.0)
HEMOGLOBIN: 13 g/dL (ref 12.0–16.0)
LYMPHS ABS: 1.3 10*3/uL (ref 1.0–3.6)
Lymphocytes Relative: 38 %
MCH: 30.1 pg (ref 26.0–34.0)
MCHC: 31.8 g/dL — AB (ref 32.0–36.0)
MCV: 94.7 fL (ref 80.0–100.0)
Monocytes Absolute: 0.2 10*3/uL (ref 0.2–0.9)
Monocytes Relative: 7 %
NEUTROS PCT: 50 %
Neutro Abs: 1.8 10*3/uL (ref 1.4–6.5)
Platelets: 76 10*3/uL — ABNORMAL LOW (ref 150–440)
RBC: 4.31 MIL/uL (ref 3.80–5.20)
RDW: 14.7 % — ABNORMAL HIGH (ref 11.5–14.5)
WBC: 3.5 10*3/uL — AB (ref 3.6–11.0)

## 2015-04-22 ENCOUNTER — Other Ambulatory Visit: Payer: Medicaid Other

## 2015-04-22 ENCOUNTER — Ambulatory Visit: Payer: Medicaid Other | Admitting: Oncology

## 2015-04-24 ENCOUNTER — Other Ambulatory Visit: Payer: Self-pay | Admitting: *Deleted

## 2015-04-24 ENCOUNTER — Telehealth: Payer: Self-pay | Admitting: *Deleted

## 2015-04-24 DIAGNOSIS — D696 Thrombocytopenia, unspecified: Secondary | ICD-10-CM

## 2015-04-24 NOTE — Telephone Encounter (Signed)
Patient is having nosebleeds.

## 2015-04-24 NOTE — Telephone Encounter (Signed)
Schedule patient to come in for lab only appt.

## 2015-04-28 ENCOUNTER — Encounter: Payer: Self-pay | Admitting: Emergency Medicine

## 2015-04-28 ENCOUNTER — Emergency Department
Admission: EM | Admit: 2015-04-28 | Discharge: 2015-04-28 | Disposition: A | Discharge status: Home / Self Care | Payer: Medicaid Other | Attending: Emergency Medicine | Admitting: Emergency Medicine

## 2015-04-28 ENCOUNTER — Emergency Department: Payer: Medicaid Other

## 2015-04-28 DIAGNOSIS — M5432 Sciatica, left side: Secondary | ICD-10-CM | POA: Diagnosis not present

## 2015-04-28 DIAGNOSIS — Z720 Tobacco use: Secondary | ICD-10-CM | POA: Diagnosis not present

## 2015-04-28 DIAGNOSIS — M549 Dorsalgia, unspecified: Secondary | ICD-10-CM | POA: Diagnosis present

## 2015-04-28 DIAGNOSIS — Z79899 Other long term (current) drug therapy: Secondary | ICD-10-CM | POA: Insufficient documentation

## 2015-04-28 DIAGNOSIS — Z7951 Long term (current) use of inhaled steroids: Secondary | ICD-10-CM | POA: Insufficient documentation

## 2015-04-28 MED ORDER — METHOCARBAMOL 500 MG PO TABS: 500.0000 mg | ORAL_TABLET | Freq: Four times a day (QID) | ORAL | Status: DC | PRN

## 2015-04-28 MED ORDER — HYDROCODONE-ACETAMINOPHEN 5-325 MG PO TABS: 1.0000 | ORAL_TABLET | ORAL | Status: DC | PRN

## 2015-04-28 MED ORDER — NAPROXEN 500 MG PO TBEC: 500.0000 mg | DELAYED_RELEASE_TABLET | Freq: Two times a day (BID) | ORAL | Status: DC

## 2015-04-28 MED ORDER — KETOROLAC TROMETHAMINE 60 MG/2ML IM SOLN
60.0000 mg | Freq: Once | INTRAMUSCULAR | Status: AC
  Administered 2015-04-28: 60 mg via INTRAMUSCULAR
  Filled 2015-04-28: qty 2

## 2015-04-28 NOTE — Discharge Instructions (Signed)
Chronic Back Pain  When back pain lasts longer than 3 months, it is called chronic back pain.People with chronic back pain often go through certain periods that are more intense (flare-ups).  CAUSES Chronic back pain can be caused by wear and tear (degeneration) on different structures in your back. These structures include:  The bones of your spine (vertebrae) and the joints surrounding your spinal cord and nerve roots (facets).  The strong, fibrous tissues that connect your vertebrae (ligaments). Degeneration of these structures may result in pressure on your nerves. This can lead to constant pain. HOME CARE INSTRUCTIONS  Avoid bending, heavy lifting, prolonged sitting, and activities which make the problem worse.  Take brief periods of rest throughout the day to reduce your pain. Lying down or standing usually is better than sitting while you are resting.  Take over-the-counter or prescription medicines only as directed by your caregiver. SEEK IMMEDIATE MEDICAL CARE IF:   You have weakness or numbness in one of your legs or feet.  You have trouble controlling your bladder or bowels.  You have nausea, vomiting, abdominal pain, shortness of breath, or fainting. Document Released: 10/27/2004 Document Revised: 12/12/2011 Document Reviewed: 09/03/2011 [facility] Patient Information 2015 [place], [place]. This information is not intended to replace advice given to you by your health care provider. Make sure you discuss any questions you have with your health care provider.  Neuropathic Pain We often think that pain has a physical cause. If we get rid of the cause, the pain should go away. Nerves themselves can also cause pain. It is called neuropathic pain, which means nerve abnormality. It may be difficult for the patients who have it and for the treating caregivers. Pain is usually described as acute (short-lived) or chronic (long-lasting). Acute pain is related to the physical sensations  caused by an injury. It can last from a few seconds to many weeks, but it usually goes away when normal healing occurs. Chronic pain lasts beyond the typical healing time. With neuropathic pain, the nerve fibers themselves may be damaged or injured. They then send incorrect signals to other pain centers. The pain you feel is real, but the cause is not easy to find.  CAUSES  Chronic pain can result from diseases, such as diabetes and shingles (an infection related to chickenpox), or from trauma, surgery, or amputation. It can also happen without any known injury or disease. The nerves are sending pain messages, even though there is no identifiable cause for such messages.   Other common causes of neuropathy include diabetes, phantom limb pain, or Regional Pain Syndrome (RPS).  As with all forms of chronic back pain, if neuropathy is not correctly treated, there can be a number of associated problems that lead to a downward cycle for the patient. These include depression, sleeplessness, feelings of fear and anxiety, limited social interaction and inability to do normal daily activities or work.  The most dramatic and mysterious example of neuropathic pain is called "phantom limb syndrome." This occurs when an arm or a leg has been removed because of illness or injury. The brain still gets pain messages from the nerves that originally carried impulses from the missing limb. These nerves now seem to misfire and cause troubling pain.  Neuropathic pain often seems to have no cause. It responds poorly to standard pain treatment. Neuropathic pain can occur after:  Shingles (herpes zoster virus infection).  A lasting burning sensation of the skin, caused usually by injury to a peripheral nerve.  Peripheral neuropathy which is widespread nerve damage, often caused by diabetes or alcoholism.  Phantom limb pain following an amputation.  Facial nerve problems (trigeminal neuralgia).  Multiple  sclerosis.  Reflex sympathetic dystrophy.  Pain which comes with cancer and cancer chemotherapy.  Entrapment neuropathy such as when pressure is put on a nerve such as in carpal tunnel syndrome.  Back, leg, and hip problems (sciatica).  Spine or back surgery.  HIV Infection or AIDS where nerves are infected by viruses. Your caregiver can explain items in the above list which may apply to you. SYMPTOMS  Characteristics of neuropathic pain are:  Severe, sharp, electric shock-like, shooting, lightening-like, knife-like.  Pins and needles sensation.  Deep burning, deep cold, or deep ache.  Persistent numbness, tingling, or weakness.  Pain resulting from light touch or other stimulus that would not usually cause pain.  Increased sensitivity to something that would normally cause pain, such as a pinprick. Pain may persist for months or years following the healing of damaged tissues. When this happens, pain signals no longer sound an alarm about current injuries or injuries about to happen. Instead, the alarm system itself is not working correctly.  Neuropathic pain may get worse instead of better over time. For some people, it can lead to serious disability. It is important to be aware that severe injury in a limb can occur without a proper, protective pain response.Burns, cuts, and other injuries may go unnoticed. Without proper treatment, these injuries can become infected or lead to further disability. Take any injury seriously, and consult your caregiver for treatment. DIAGNOSIS  When you have a pain with no known cause, your caregiver will probably ask some specific questions:   Do you have any other conditions, such as diabetes, shingles, multiple sclerosis, or HIV infection?  How would you describe your pain? (Neuropathic pain is often described as shooting, stabbing, burning, or searing.)  Is your pain worse at any time of the day? (Neuropathic pain is usually worse at  night.)  Does the pain seem to follow a certain physical pathway?  Does the pain come from an area that has missing or injured nerves? (An example would be phantom limb pain.)  Is the pain triggered by minor things such as rubbing against the sheets at night? These questions often help define the type of pain involved. Once your caregiver knows what is happening, treatment can begin. Anticonvulsant, antidepressant drugs, and various pain relievers seem to work in some cases. If another condition, such as diabetes is involved, better management of that disorder may relieve the neuropathic pain.  TREATMENT  Neuropathic pain is frequently long-lasting and tends not to respond to treatment with narcotic type pain medication. It may respond well to other drugs such as antiseizure and antidepressant medications. Usually, neuropathic problems do not completely go away, but partial improvement is often possible with proper treatment. Your caregivers have large numbers of medications available to treat you. Do not be discouraged if you do not get immediate relief. Sometimes different medications or a combination of medications will be tried before you receive the results you are hoping for. See your caregiver if you have pain that seems to be coming from nowhere and does not go away. Help is available.  SEEK IMMEDIATE MEDICAL CARE IF:   There is a sudden change in the quality of your pain, especially if the change is on only one side of the body.  You notice changes of the skin, such as redness, black or  purple discoloration, swelling, or an ulcer.  You cannot move the affected limbs. Document Released: 06/16/2004 Document Revised: 12/12/2011 Document Reviewed: 06/16/2004 [facility] Patient Information 2015 [place], [place]. This information is not intended to replace advice given to you by your health care provider. Make sure you discuss any questions you have with your health care  provider.  Sciatica Sciatica is pain, weakness, numbness, or tingling along your sciatic nerve. The nerve starts in the lower back and runs down the back of each leg. Nerve damage or certain conditions pinch or put pressure on the sciatic nerve. This causes the pain, weakness, and other discomforts of sciatica. HOME CARE   Only take medicine as told by your doctor.  Apply ice to the affected area for 20 minutes. Do this 3-4 times a day for the first 48-72 hours. Then try heat in the same way.  Exercise, stretch, or do your usual activities if these do not make your pain worse.  Go to physical therapy as told by your doctor.  Keep all doctor visits as told.  Do not wear high heels or shoes that are not supportive.  Get a firm mattress if your mattress is too soft to lessen pain and discomfort. GET HELP RIGHT AWAY IF:   You cannot control when you poop (bowel movement) or pee (urinate).  You have more weakness in your lower back, lower belly (pelvis), butt (buttocks), or legs.  You have redness or puffiness (swelling) of your back.  You have a burning feeling when you pee.  You have pain that gets worse when you lie down.  You have pain that wakes you from your sleep.  Your pain is worse than past pain.  Your pain lasts longer than 4 weeks.  You are suddenly losing weight without reason. MAKE SURE YOU:   Understand these instructions.  Will watch this condition.  Will get help right away if you are not doing well or get worse. Document Released: 06/28/2008 Document Revised: 03/20/2012 Document Reviewed: 01/29/2012 [facility] Patient Information 2015 [place], [place]. This information is not intended to replace advice given to you by your health care provider. Make sure you discuss any questions you have with your health care provider.

## 2015-04-28 NOTE — ED Provider Notes (Signed)
[facility] Emergency Department Provider Note  ____________________________________________  Time seen: Approximately 3:05 PM  I have reviewed the triage vital signs and the nursing notes.   HISTORY  Chief Complaint Back Pain   HPI [name] is a 44 y.o. female who presents for evaluation of left buttocks pain. Pain has been going on-and-off for years is worse over the last couple days. States that she's been followed by pain management and orthopedics. Past medical history significant for previous orthopedic surgery on her left leg with rods and pins intact. States that she is unable to ambulate secondary to the pain in her buttock. Patient was hospitalized earlier this past month with pneumonia and respiratory failure.   Past Medical History  Diagnosis Date  . Bipolar 1 disorder   . Other abdominal hernia     disk disease  . PTSD (post-traumatic stress disorder)   . Leg fracture   . Asthma   . GERD (gastroesophageal reflux disease)   . Scoliosis   . Fibromyalgia   . Agoraphobia with panic disorder   . COPD (chronic obstructive pulmonary disease)   . Depression     Patient Active Problem List   Diagnosis Date Noted  . Bilateral pneumonia 03/24/2015  . Acute respiratory failure 03/24/2015  . Pneumonia 03/24/2015  . Left tibial fracture 03/24/2015  . Chronic pain 03/24/2015    Past Surgical History  Procedure Laterality Date  . Other surgical history      left tib fib repair  . Other surgical history      left tib fib repair, has boot on  . Fracture surgery    . Appendectomy    . Cholecystectomy    . Tubal ligation      Current Outpatient Rx  Name  Route  Sig  Dispense  Refill  . ALPRAZolam (XANAX) 0.5 MG tablet   Oral   Take 2 mg by mouth 3 (three) times daily.         Marland Kitchen amphetamine-dextroamphetamine (ADDERALL) 10 MG tablet   Oral   Take 10 mg by mouth daily.         . cetirizine (ZYRTEC) 10 MG tablet   Oral  Take 10 mg by mouth daily.       0   . cyclobenzaprine (FLEXERIL) 10 MG tablet   Oral   Take 10 mg by mouth every 12 (twelve) hours.      0   . doxycycline (VIBRAMYCIN) 50 MG capsule   Oral   Take 2 capsules (100 mg total) by mouth 2 (two) times daily.   28 capsule   0   . EPINEPHrine (EPIPEN 2-PAK) 0.3 mg/0.3 mL IJ SOAJ injection   Intramuscular   Inject 0.3 mg into the muscle as needed.          . fluconazole (DIFLUCAN) 150 MG tablet   Oral   Take 1 tablet (150 mg total) by mouth daily.   6 tablet   0   . furosemide (LASIX) 40 MG tablet   Oral   Take 40 mg by mouth daily.       0   . gabapentin (NEURONTIN) 400 MG capsule   Oral   Take 400 mg by mouth 3 (three) times daily.       0   . HYDROcodone-acetaminophen (NORCO) 5-325 MG per tablet   Oral   Take 1-2 tablets by mouth every 4 (four) hours as needed for moderate pain.   15 tablet  0   . hydrOXYzine (VISTARIL) 50 MG capsule   Oral   Take 1 capsule by mouth 4 (four) times daily.      0   . ketoconazole (NIZORAL) 2 % cream   Topical   Apply 1 application topically 2 (two) times daily.      0   . methocarbamol (ROBAXIN) 500 MG tablet   Oral   Take 1 tablet (500 mg total) by mouth every 6 (six) hours as needed for muscle spasms.   30 tablet   0   . mometasone (NASONEX) 50 MCG/ACT nasal spray   Nasal   Place 2 sprays into the nose daily.      0   . mometasone (NASONEX) 50 MCG/ACT nasal spray      2 sprays by Each Nare route daily.         . naproxen (EC NAPROSYN) 500 MG EC tablet   Oral   Take 1 tablet (500 mg total) by mouth 2 (two) times daily with a meal.   60 tablet   0   . Olopatadine HCl (PATADAY) 0.2 % SOLN   Both Eyes   Place 1 drop into both eyes daily.         Marland Kitchen omeprazole (PRILOSEC) 40 MG capsule   Oral   Take 40 mg by mouth daily.       0   . PREMARIN 0.625 MG tablet   Oral   Take 0.625 mg by mouth daily.       0     Dispense as written.   Marland Kitchen PROAIR HFA  108 (90 BASE) MCG/ACT inhaler   Inhalation   Inhale 2 puffs into the lungs every 4 (four) hours as needed.       0     Dispense as written.   Marland Kitchen QUEtiapine (SEROQUEL) 300 MG tablet   Oral   Take 600 mg by mouth at bedtime.      0   . Vitamin D, Ergocalciferol, (DRISDOL) 50000 UNITS CAPS capsule   Oral   Take 50,000 Units by mouth once a week.      0     Allergies Sulfa antibiotics; Coconut oil; Other; and Tramadol  No family history on file.  Social History History  Substance Use Topics  . Smoking status: Current Every Day Smoker -- 0.50 packs/day for 12 years  . Smokeless tobacco: Not on file  . Alcohol Use: No    Review of Systems Constitutional: No fever/chills Eyes: No visual changes. ENT: No sore throat. Cardiovascular: Denies chest pain. Respiratory: Denies shortness of breath. Gastrointestinal: No abdominal pain.  No nausea, no vomiting.  No diarrhea.  No constipation. Genitourinary: Negative for dysuria. Musculoskeletal: Negative for back pain. Positive for left buttocks pain Skin: Negative for rash. Neurological: Negative for headaches, focal weakness or numbness.  10-point ROS otherwise negative.  ____________________________________________   PHYSICAL EXAM:  VITAL SIGNS: ED Triage Vitals  Enc Vitals Group     BP --      Pulse --      Resp --      Temp --      Temp src --      SpO2 --      Weight --      Height --      Head Cir --      Peak Flow --      Pain Score --      Pain Loc --  Pain Edu? --      Excl. in [place]? --     Constitutional: Alert and oriented. Well appearing and sitting in wheelchair. Patient stands with difficulty. Neck: No stridor.   Cardiovascular: Normal rate, regular rhythm. Grossly normal heart sounds.  Good peripheral circulation. Respiratory: Normal respiratory effort.  No retractions. Lungs CTAB. Musculoskeletal: No lower extremity tenderness nor edema.  No joint effusions. Positive tenderness to the left  buttocks Neurologic:  Normal speech and language. No gross focal neurologic deficits are appreciated.  Skin:  Skin is warm, dry and intact. No rash noted. Psychiatric: Mood and affect are normal. Speech and behavior are normal.  ____________________________________________   LABS (all labs ordered are listed, but only abnormal results are displayed)  Labs Reviewed - No data to display ____________________________________________  EKG  Deferred ____________________________________________  RADIOLOGY  Negative interpreted by radiologist. Reviewed by myself ____________________________________________   PROCEDURES  Procedure(s) performed: None  Critical Care performed: No  ____________________________________________   INITIAL IMPRESSION / ASSESSMENT AND PLAN / ED COURSE  Pertinent labs & imaging results that were available during my care of the patient were reviewed by me and considered in my medical decision making (see chart for details).  Acute low back pain with left buttocks pain. No relief with current medication prescribed. Robaxin 750 4 times a day. Motrin 800 mg 3 times a day. Norco 5/325 #10. ____________________________________________   FINAL CLINICAL IMPRESSION(S) / ED DIAGNOSES  Final diagnoses:  Sciatica, left      Arlyss Repress, PA-C 04/28/15 1619  Carrie Mew, MD 04/28/15 2210

## 2015-04-28 NOTE — ED Notes (Signed)
States she has chronic back pain .  pain has increased over the past several days and radiates into left leg

## 2015-04-28 NOTE — ED Notes (Signed)
Presents with lower back pain which radiates into left leg  Pt has chronic back

## 2015-05-01 NOTE — Progress Notes (Signed)
[name]  Telephone:(336) [phone] Fax:(336) [phone]  ID: [name] OB: 07-29-1971  MR#: [id number]  PQZ#:[id number]  Patient Care Team: Lorelee Market, MD as PCP - General (Family Medicine)  CHIEF COMPLAINT:  Chief Complaint  Patient presents with  . Follow-up    thrombocytopenia    INTERVAL HISTORY: Patient returns to clinic today for repeat laboratory work and further evaluation. She continues to have multiple medical complaints which are all chronic and unchanged in nature. She denies any easy bleeding or bruising. She has no neurologic complaints. She denies any fevers.  She has a good appetite and denies weight loss.  She denies any chest pain or shortness of breath. She denies any nausea, vomiting, constipation, or diarrhea. She has no urinary complaints. Patient offers no further specific complaints.  REVIEW OF SYSTEMS:   Review of Systems  Constitutional: Negative.   Endo/Heme/Allergies: Does not bruise/bleed easily.  Psychiatric/Behavioral: Negative.     As per HPI. Otherwise, a complete review of systems is negatve.  PAST MEDICAL HISTORY: Past Medical History  Diagnosis Date  . Bipolar 1 disorder   . Other abdominal hernia     disk disease  . PTSD (post-traumatic stress disorder)   . Leg fracture   . Asthma   . GERD (gastroesophageal reflux disease)   . Scoliosis   . Fibromyalgia   . Agoraphobia with panic disorder   . COPD (chronic obstructive pulmonary disease)   . Depression     PAST SURGICAL HISTORY: Past Surgical History  Procedure Laterality Date  . Other surgical history      left tib fib repair  . Other surgical history      left tib fib repair, has boot on  . Fracture surgery    . Appendectomy    . Cholecystectomy    . Tubal ligation      FAMILY HISTORY: CVA, CAD, depression, bipolar.     ADVANCED DIRECTIVES:    HEALTH MAINTENANCE: History  Substance Use Topics  . Smoking status: Current Every  Day Smoker -- 0.50 packs/day for 12 years  . Smokeless tobacco: Not on file  . Alcohol Use: No     Colonoscopy:  PAP:  Bone density:  Lipid panel:  Allergies  Allergen Reactions  . Sulfa Antibiotics Shortness Of Breath  . Coconut Oil Swelling  . Other     Steroids, causes hallunications  . Tramadol Anxiety    Current Outpatient Prescriptions  Medication Sig Dispense Refill  . ALPRAZolam (XANAX) 0.5 MG tablet Take 2 mg by mouth 3 (three) times daily.    Marland Kitchen amphetamine-dextroamphetamine (ADDERALL) 10 MG tablet Take 10 mg by mouth daily.    . cetirizine (ZYRTEC) 10 MG tablet Take 10 mg by mouth daily.   0  . cyclobenzaprine (FLEXERIL) 10 MG tablet Take 10 mg by mouth every 12 (twelve) hours.  0  . doxycycline (VIBRAMYCIN) 50 MG capsule Take 2 capsules (100 mg total) by mouth 2 (two) times daily. 28 capsule 0  . EPINEPHrine (EPIPEN 2-PAK) 0.3 mg/0.3 mL IJ SOAJ injection Inject 0.3 mg into the muscle as needed.     . fluconazole (DIFLUCAN) 150 MG tablet Take 1 tablet (150 mg total) by mouth daily. 6 tablet 0  . furosemide (LASIX) 40 MG tablet Take 40 mg by mouth daily.   0  . gabapentin (NEURONTIN) 400 MG capsule Take 400 mg by mouth 3 (three) times daily.   0  . hydrOXYzine (VISTARIL) 50 MG capsule Take 1 capsule  by mouth 4 (four) times daily.  0  . ketoconazole (NIZORAL) 2 % cream Apply 1 application topically 2 (two) times daily.  0  . mometasone (NASONEX) 50 MCG/ACT nasal spray Place 2 sprays into the nose daily.  0  . mometasone (NASONEX) 50 MCG/ACT nasal spray 2 sprays by Each Nare route daily.    . Olopatadine HCl (PATADAY) 0.2 % SOLN Place 1 drop into both eyes daily.    Marland Kitchen omeprazole (PRILOSEC) 40 MG capsule Take 40 mg by mouth daily.   0  . PREMARIN 0.625 MG tablet Take 0.625 mg by mouth daily.   0  . PROAIR HFA 108 (90 BASE) MCG/ACT inhaler Inhale 2 puffs into the lungs every 4 (four) hours as needed.   0  . QUEtiapine (SEROQUEL) 300 MG tablet Take 600 mg by mouth at  bedtime.  0  . Vitamin D, Ergocalciferol, (DRISDOL) 50000 UNITS CAPS capsule Take 50,000 Units by mouth once a week.  0  . HYDROcodone-acetaminophen (NORCO) 5-325 MG per tablet Take 1-2 tablets by mouth every 4 (four) hours as needed for moderate pain. 15 tablet 0  . methocarbamol (ROBAXIN) 500 MG tablet Take 1 tablet (500 mg total) by mouth every 6 (six) hours as needed for muscle spasms. 30 tablet 0  . naproxen (EC NAPROSYN) 500 MG EC tablet Take 1 tablet (500 mg total) by mouth 2 (two) times daily with a meal. 60 tablet 0   No current facility-administered medications for this visit.    OBJECTIVE: Filed Vitals:   04/15/15 1636  BP: 117/87  Pulse: 86  Temp: 97.8 F (36.6 C)  Resp: 20     Body mass index is 32.65 kg/(m^2).    ECOG FS:0 - Asymptomatic  General: Well-developed, well-nourished, no acute distress. Eyes: Pink conjunctiva, anicteric sclera. Lungs: Clear to auscultation bilaterally. Heart: Regular rate and rhythm. No rubs, murmurs, or gallops. Abdomen: Soft, nontender, nondistended. No organomegaly noted, normoactive bowel sounds. Musculoskeletal: No edema, cyanosis, or clubbing. Neuro: Alert, answering all questions appropriately. Cranial nerves grossly intact. Skin: No rashes or petechiae noted. Psych: Normal affect.  LAB RESULTS:  Lab Results  Component Value Date   NA 140 03/26/2015   K 4.4 03/26/2015   CL 109 03/26/2015   CO2 26 03/26/2015   GLUCOSE 187* 03/26/2015   BUN 21* 03/26/2015   CREATININE 0.92 03/26/2015   CALCIUM 8.7* 03/26/2015   PROT 6.3* 03/25/2015   ALBUMIN 2.8* 03/25/2015   AST 75* 03/25/2015   ALT 60* 03/25/2015   ALKPHOS 163* 03/25/2015   BILITOT 0.8 03/25/2015   GFRNONAA >60 03/26/2015   GFRAA >60 03/26/2015    Lab Results  Component Value Date   WBC 3.5* 04/15/2015   NEUTROABS 1.8 04/15/2015   HGB 13.0 04/15/2015   HCT 40.8 04/15/2015   MCV 94.7 04/15/2015   PLT 76* 04/15/2015     STUDIES: Dg Sacrum/coccyx  04/28/2015    CLINICAL DATA:  Chronic low back pain. Pain has worsened over the past several days and now radiates into the left leg. No known injury.  EXAM: SACRUM AND COCCYX - 2+ VIEW  COMPARISON:  CT abdomen and pelvis 11/26/2013.  FINDINGS: There is no evidence of fracture or other focal bone lesions.  IMPRESSION: Negative exam.   Electronically Signed   By: Inge Rise M.D.   On: 04/28/2015 16:09   Dg Tibia/fibula Left  04/01/2015   CLINICAL DATA:  Increasing left lower extremity swelling  EXAM: LEFT TIBIA AND FIBULA - 2  VIEW  COMPARISON:  02/18/2015  FINDINGS: A medullary rod is noted in the tibia with proximal and distal fixation screws. Healing is noted at the fracture site. Some generalized soft tissue swelling with subcutaneous edema is noted. Proximal healing fibular fracture is noted as well. No acute bony abnormality is seen.  IMPRESSION: Healing tibial fracture with evidence of fixation. No acute abnormality is noted. The lucency seen posteriorly on the prior exam demonstrates increased callus formation.   Electronically Signed   By: Inez Catalina M.D.   On: 04/01/2015 22:06   US Venous Img Lower Unilateral Left  04/01/2015   CLINICAL DATA:  Left leg pain and swelling  EXAM: Left LOWER EXTREMITY VENOUS DOPPLER ULTRASOUND  TECHNIQUE: Gray-scale sonography with graded compression, as well as color Doppler and duplex ultrasound were performed to evaluate the lower extremity deep venous systems from the level of the common femoral vein and including the common femoral, femoral, profunda femoral, popliteal and calf veins including the posterior tibial, peroneal and gastrocnemius veins when visible. The superficial great saphenous vein was also interrogated. Spectral Doppler was utilized to evaluate flow at rest and with distal augmentation maneuvers in the common femoral, femoral and popliteal veins.  COMPARISON:  None.  FINDINGS: Contralateral Common Femoral Vein: Respiratory phasicity is normal and  symmetric with the symptomatic side. No evidence of thrombus. Normal compressibility.  Common Femoral Vein: No evidence of thrombus. Normal compressibility, respiratory phasicity and response to augmentation.  Saphenofemoral Junction: No evidence of thrombus. Normal compressibility and flow on color Doppler imaging.  Profunda Femoral Vein: No evidence of thrombus. Normal compressibility and flow on color Doppler imaging.  Femoral Vein: No evidence of thrombus. Normal compressibility, respiratory phasicity and response to augmentation.  Popliteal Vein: No evidence of thrombus. Normal compressibility, respiratory phasicity and response to augmentation.  Calf Veins: No evidence of thrombus. Normal compressibility and flow on color Doppler imaging.  Superficial Great Saphenous Vein: No evidence of thrombus. Normal compressibility and flow on color Doppler imaging.  Venous Reflux:  None.  Other Findings:  None.  IMPRESSION: No evidence of deep venous thrombosis.   Electronically Signed   By: Inez Catalina M.D.   On: 04/01/2015 19:39    ASSESSMENT: Thrombocytopenia.  PLAN:    1. Thrombocytopenia: Decreased, but stable.  Possibly multifactorial. Patient noted to have positive platelet antibodies as well as a positive ANA. Will repeat both with next lab draw. Remainder of her laboratory work is either negative or within normal limits. Ultrasound of the spleen did not reveal splenomegaly. No intervention is needed at this time. Patient will return to clinic in 3 months with repeat laboratory work and then in 6 months with repeat laboratory work and further evaluation.  Patient expressed understanding and was in agreement with this plan. She also understands that She can call clinic at any time with any questions, concerns, or complaints.    Lloyd Huger, MD   05/01/2015 10:11 AM

## 2015-05-15 ENCOUNTER — Encounter: Payer: Self-pay | Admitting: Emergency Medicine

## 2015-05-15 DIAGNOSIS — G8929 Other chronic pain: Secondary | ICD-10-CM | POA: Insufficient documentation

## 2015-05-15 DIAGNOSIS — M549 Dorsalgia, unspecified: Secondary | ICD-10-CM | POA: Insufficient documentation

## 2015-05-15 DIAGNOSIS — Z720 Tobacco use: Secondary | ICD-10-CM | POA: Diagnosis not present

## 2015-05-15 NOTE — ED Notes (Signed)
Patient states that she has chronic back pain and that it has become worse over the last 2 months. Patient states that she was seen at her pcp Monday.

## 2015-05-16 ENCOUNTER — Emergency Department
Admission: EM | Admit: 2015-05-16 | Discharge: 2015-05-16 | Disposition: A | Discharge status: Home / Self Care | Payer: Medicaid Other | Source: Home / Self Care | Attending: Emergency Medicine | Admitting: Emergency Medicine

## 2015-05-16 ENCOUNTER — Emergency Department
Admission: EM | Admit: 2015-05-16 | Discharge: 2015-05-16 | Discharge status: Against Medical Advice | Payer: Medicaid Other | Attending: Emergency Medicine | Admitting: Emergency Medicine

## 2015-05-16 DIAGNOSIS — Z7951 Long term (current) use of inhaled steroids: Secondary | ICD-10-CM | POA: Insufficient documentation

## 2015-05-16 DIAGNOSIS — M5430 Sciatica, unspecified side: Secondary | ICD-10-CM | POA: Insufficient documentation

## 2015-05-16 DIAGNOSIS — Z791 Long term (current) use of non-steroidal anti-inflammatories (NSAID): Secondary | ICD-10-CM | POA: Insufficient documentation

## 2015-05-16 DIAGNOSIS — Z79899 Other long term (current) drug therapy: Secondary | ICD-10-CM

## 2015-05-16 DIAGNOSIS — Z792 Long term (current) use of antibiotics: Secondary | ICD-10-CM | POA: Insufficient documentation

## 2015-05-16 DIAGNOSIS — G8929 Other chronic pain: Secondary | ICD-10-CM

## 2015-05-16 DIAGNOSIS — Z720 Tobacco use: Secondary | ICD-10-CM

## 2015-05-16 DIAGNOSIS — M62838 Other muscle spasm: Secondary | ICD-10-CM | POA: Insufficient documentation

## 2015-05-16 MED ORDER — KETOROLAC TROMETHAMINE 60 MG/2ML IM SOLN
60.0000 mg | Freq: Once | INTRAMUSCULAR | Status: AC
  Administered 2015-05-16: 60 mg via INTRAMUSCULAR
  Filled 2015-05-16: qty 2

## 2015-05-16 MED ORDER — HYDROCODONE-ACETAMINOPHEN 5-325 MG PO TABS: 1.0000 | ORAL_TABLET | ORAL | Status: DC | PRN

## 2015-05-16 NOTE — ED Provider Notes (Signed)
[facility] Emergency Department Provider Note  ____________________________________________  Time seen: Approximately 8:03 PM  I have reviewed the triage vital signs and the nursing notes.   HISTORY  Chief Complaint Sciatica   HPI [name] is a 44 y.o. female presents for evaluation of chronic back pain. She was seen personally by me 2 weeks ago. Complaining of the same issues and states that she cannot get in chronic pain management until the 30th of this month. Chronic pain policy was explained to her that she would not be able to gain more pain medication here.  Past Medical History  Diagnosis Date  . Bipolar 1 disorder   . Other abdominal hernia     disk disease  . PTSD (post-traumatic stress disorder)   . Leg fracture   . Asthma   . GERD (gastroesophageal reflux disease)   . Scoliosis   . Fibromyalgia   . Agoraphobia with panic disorder   . COPD (chronic obstructive pulmonary disease)   . Depression     Patient Active Problem List   Diagnosis Date Noted  . Bilateral pneumonia 03/24/2015  . Acute respiratory failure 03/24/2015  . Pneumonia 03/24/2015  . Left tibial fracture 03/24/2015  . Chronic pain 03/24/2015    Past Surgical History  Procedure Laterality Date  . Other surgical history      left tib fib repair  . Other surgical history      left tib fib repair, has boot on  . Fracture surgery    . Appendectomy    . Cholecystectomy    . Tubal ligation      Current Outpatient Rx  Name  Route  Sig  Dispense  Refill  . ALPRAZolam (XANAX) 0.5 MG tablet   Oral   Take 2 mg by mouth 3 (three) times daily.         Marland Kitchen amphetamine-dextroamphetamine (ADDERALL) 10 MG tablet   Oral   Take 10 mg by mouth daily.         . cetirizine (ZYRTEC) 10 MG tablet   Oral   Take 10 mg by mouth daily.       0   . cyclobenzaprine (FLEXERIL) 10 MG tablet   Oral   Take 10 mg by mouth every 12 (twelve) hours.      0   .  doxycycline (VIBRAMYCIN) 50 MG capsule   Oral   Take 2 capsules (100 mg total) by mouth 2 (two) times daily.   28 capsule   0   . EPINEPHrine (EPIPEN 2-PAK) 0.3 mg/0.3 mL IJ SOAJ injection   Intramuscular   Inject 0.3 mg into the muscle as needed.          . fluconazole (DIFLUCAN) 150 MG tablet   Oral   Take 1 tablet (150 mg total) by mouth daily.   6 tablet   0   . furosemide (LASIX) 40 MG tablet   Oral   Take 40 mg by mouth daily.       0   . gabapentin (NEURONTIN) 400 MG capsule   Oral   Take 400 mg by mouth 3 (three) times daily.       0   . HYDROcodone-acetaminophen (NORCO) 5-325 MG per tablet   Oral   Take 1-2 tablets by mouth every 4 (four) hours as needed for moderate pain.   10 tablet   0   . hydrOXYzine (VISTARIL) 50 MG capsule   Oral   Take 1 capsule by  mouth 4 (four) times daily.      0   . ketoconazole (NIZORAL) 2 % cream   Topical   Apply 1 application topically 2 (two) times daily.      0   . methocarbamol (ROBAXIN) 500 MG tablet   Oral   Take 1 tablet (500 mg total) by mouth every 6 (six) hours as needed for muscle spasms.   30 tablet   0   . mometasone (NASONEX) 50 MCG/ACT nasal spray   Nasal   Place 2 sprays into the nose daily.      0   . mometasone (NASONEX) 50 MCG/ACT nasal spray      2 sprays by Each Nare route daily.         . naproxen (EC NAPROSYN) 500 MG EC tablet   Oral   Take 1 tablet (500 mg total) by mouth 2 (two) times daily with a meal.   60 tablet   0   . Olopatadine HCl (PATADAY) 0.2 % SOLN   Both Eyes   Place 1 drop into both eyes daily.         Marland Kitchen omeprazole (PRILOSEC) 40 MG capsule   Oral   Take 40 mg by mouth daily.       0   . PREMARIN 0.625 MG tablet   Oral   Take 0.625 mg by mouth daily.       0     Dispense as written.   Marland Kitchen PROAIR HFA 108 (90 BASE) MCG/ACT inhaler   Inhalation   Inhale 2 puffs into the lungs every 4 (four) hours as needed.       0     Dispense as written.   Marland Kitchen  QUEtiapine (SEROQUEL) 300 MG tablet   Oral   Take 600 mg by mouth at bedtime.      0   . Vitamin D, Ergocalciferol, (DRISDOL) 50000 UNITS CAPS capsule   Oral   Take 50,000 Units by mouth once a week.      0     Allergies Sulfa antibiotics; Coconut oil; Other; and Tramadol  No family history on file.  Social History Social History  Substance Use Topics  . Smoking status: Current Every Day Smoker -- 0.50 packs/day for 12 years  . Smokeless tobacco: Not on file  . Alcohol Use: No    Review of Systems Constitutional: No fever/chills Eyes: No visual changes. ENT: No sore throat. Cardiovascular: Denies chest pain. Respiratory: Denies shortness of breath. Gastrointestinal: No abdominal pain.  No nausea, no vomiting.  No diarrhea.  No constipation. Genitourinary: Negative for dysuria. Musculoskeletal: Positive for chronic back pain and sciatica. Skin: Negative for rash. Neurological: Negative for headaches, focal weakness or numbness.  10-point ROS otherwise negative.  ____________________________________________   PHYSICAL EXAM:  VITAL SIGNS: ED Triage Vitals  Enc Vitals Group     BP 05/16/15 1936 103/74 mmHg     Pulse Rate 05/16/15 1936 102     Resp 05/16/15 1936 24     Temp 05/16/15 1936 97.6 F (36.4 C)     Temp Source 05/16/15 1936 Oral     SpO2 05/16/15 1936 100 %     Weight 05/16/15 1936 180 lb (81.647 kg)     Height 05/16/15 1936 5\' 4"  (1.626 m)     Head Cir --      Peak Flow --      Pain Score 05/16/15 1937 10     Pain Loc --  Pain Edu? --      Excl. in [place]? --     Constitutional: Alert and oriented. Well appearing and in no acute distress.  Cardiovascular: Normal rate, regular rhythm. Grossly normal heart sounds.  Good peripheral circulation. Respiratory: Normal respiratory effort.  No retractions. Lungs CTAB. Musculoskeletal: No lower extremity tenderness nor edema.  No joint effusions. Trachea leg positive bilaterally subjective. Obvious  muscle spasms and no spinal tenderness. Neurologic:  Normal speech and language. No gross focal neurologic deficits are appreciated. No gait instability. Skin:  Skin is warm, dry and intact. No rash noted. Psychiatric: Mood and affect are normal. Speech and behavior are normal.  ____________________________________________   LABS (all labs ordered are listed, but only abnormal results are displayed)  Labs Reviewed - No data to display ____________________________________________   PROCEDURES  Procedure(s) performed: None  Critical Care performed: No  ____________________________________________   INITIAL IMPRESSION / ASSESSMENT AND PLAN / ED COURSE  Pertinent labs & imaging results that were available during my care of the patient were reviewed by me and considered in my medical decision making (see chart for details).  Toradol 60 mg IM given patient given #8 Percocet 02/03/2024. She is follow-up with chronic pain clinic as directed. Patient states some relief with Toradol 60 mg IM. She voices no other emergency medical complaints at this time. [place] controlled substance registry was reviewed prior to prescriptions. ____________________________________________   FINAL CLINICAL IMPRESSION(S) / ED DIAGNOSES  Final diagnoses:  Chronic pain  Sciatica, unspecified laterality      Arlyss Repress, PA-C 05/16/15 2111  Lisa Roca, MD 05/16/15 2236

## 2015-05-16 NOTE — ED Notes (Signed)
Pt in via EMS with c/o sciatica. EMS reports pt ambulatory on scene. 98%RA, pain to left side. Allergic to Sulfa and tramadol per EMS. NSR  Pt reports was here last pm but left because the wait to long. Pt reports pain is still there from her left lower back down her leg. Pt reports hx of sciatica and states this is it.

## 2015-05-16 NOTE — Discharge Instructions (Signed)
Chronic Back Pain  When back pain lasts longer than 3 months, it is called chronic back pain.People with chronic back pain often go through certain periods that are more intense (flare-ups).  CAUSES Chronic back pain can be caused by wear and tear (degeneration) on different structures in your back. These structures include:  The bones of your spine (vertebrae) and the joints surrounding your spinal cord and nerve roots (facets).  The strong, fibrous tissues that connect your vertebrae (ligaments). Degeneration of these structures may result in pressure on your nerves. This can lead to constant pain. HOME CARE INSTRUCTIONS  Avoid bending, heavy lifting, prolonged sitting, and activities which make the problem worse.  Take brief periods of rest throughout the day to reduce your pain. Lying down or standing usually is better than sitting while you are resting.  Take over-the-counter or prescription medicines only as directed by your caregiver. SEEK IMMEDIATE MEDICAL CARE IF:   You have weakness or numbness in one of your legs or feet.  You have trouble controlling your bladder or bowels.  You have nausea, vomiting, abdominal pain, shortness of breath, or fainting. Document Released: 10/27/2004 Document Revised: 12/12/2011 Document Reviewed: 09/03/2011 [facility] Patient Information 2015 [place], [place]. This information is not intended to replace advice given to you by your health care provider. Make sure you discuss any questions you have with your health care provider.  Sciatica Sciatica is pain, weakness, numbness, or tingling along the path of the sciatic nerve. The nerve starts in the lower back and runs down the back of each leg. The nerve controls the muscles in the lower leg and in the back of the knee, while also providing sensation to the back of the thigh, lower leg, and the sole of your foot. Sciatica is a symptom of another medical condition. For instance, nerve damage or certain  conditions, such as a herniated disk or bone spur on the spine, pinch or put pressure on the sciatic nerve. This causes the pain, weakness, or other sensations normally associated with sciatica. Generally, sciatica only affects one side of the body. CAUSES   Herniated or slipped disc.  Degenerative disk disease.  A pain disorder involving the narrow muscle in the buttocks (piriformis syndrome).  Pelvic injury or fracture.  Pregnancy.  Tumor (rare). SYMPTOMS  Symptoms can vary from mild to very severe. The symptoms usually travel from the low back to the buttocks and down the back of the leg. Symptoms can include:  Mild tingling or dull aches in the lower back, leg, or hip.  Numbness in the back of the calf or sole of the foot.  Burning sensations in the lower back, leg, or hip.  Sharp pains in the lower back, leg, or hip.  Leg weakness.  Severe back pain inhibiting movement. These symptoms may get worse with coughing, sneezing, laughing, or prolonged sitting or standing. Also, being overweight may worsen symptoms. DIAGNOSIS  Your caregiver will perform a physical exam to look for common symptoms of sciatica. He or she may ask you to do certain movements or activities that would trigger sciatic nerve pain. Other tests may be performed to find the cause of the sciatica. These may include:  Blood tests.  X-rays.  Imaging tests, such as an MRI or CT scan. TREATMENT  Treatment is directed at the cause of the sciatic pain. Sometimes, treatment is not necessary and the pain and discomfort goes away on its own. If treatment is needed, your caregiver may suggest:  Over-the-counter  medicines to relieve pain.  Prescription medicines, such as anti-inflammatory medicine, muscle relaxants, or narcotics.  Applying heat or ice to the painful area.  Steroid injections to lessen pain, irritation, and inflammation around the nerve.  Reducing activity during periods of pain.  Exercising  and stretching to strengthen your abdomen and improve flexibility of your spine. Your caregiver may suggest losing weight if the extra weight makes the back pain worse.  Physical therapy.  Surgery to eliminate what is pressing or pinching the nerve, such as a bone spur or part of a herniated disk. HOME CARE INSTRUCTIONS   Only take over-the-counter or prescription medicines for pain or discomfort as directed by your caregiver.  Apply ice to the affected area for 20 minutes, 3-4 times a day for the first 48-72 hours. Then try heat in the same way.  Exercise, stretch, or perform your usual activities if these do not aggravate your pain.  Attend physical therapy sessions as directed by your caregiver.  Keep all follow-up appointments as directed by your caregiver.  Do not wear high heels or shoes that do not provide proper support.  Check your mattress to see if it is too soft. A firm mattress may lessen your pain and discomfort. SEEK IMMEDIATE MEDICAL CARE IF:   You lose control of your bowel or bladder (incontinence).  You have increasing weakness in the lower back, pelvis, buttocks, or legs.  You have redness or swelling of your back.  You have a burning sensation when you urinate.  You have pain that gets worse when you lie down or awakens you at night.  Your pain is worse than you have experienced in the past.  Your pain is lasting longer than 4 weeks.  You are suddenly losing weight without reason. MAKE SURE YOU:  Understand these instructions.  Will watch your condition.  Will get help right away if you are not doing well or get worse. Document Released: 09/13/2001 Document Revised: 03/20/2012 Document Reviewed: 01/29/2012 [facility] Patient Information 2015 [place], [place]. This information is not intended to replace advice given to you by your health care provider. Make sure you discuss any questions you have with your health care provider.  Emergency care providers  appreciate that many patients coming to [place] are in severe pain and we wish to address their pain in the safest, most responsible manner.  It is important to recognize, however, that the proper treatment of chronic pain differs from that of the pain of injuries and acute illnesses.  Our goal is to provider quality, safe, personalized care and we thank you for giving [place] the opportunity to serve you.  The use of narcotics and related agents for chronic pain syndromes may lead to additional physical and psychological problems.  Nearly as many people die from prescription narcotics each year as die from car crashes.  Additionally, this risk is increased if such prescriptions are obtained from a variety of sources.  Therefore, only your primary care physician or a pain management specialist is able to safely treat such syndromes with narcotic medications long-term.  Documentation revealing such prescriptions have been sought from multiple sources may prohibit [place] from providing a refill or different narcotic medication.  Your name may be checked first through the [facility].  This database is a record of controlled substance medication prescriptions that the patient has received.  This has been established by [facility] in an effort to eliminate the dangerous, and often life-threatening, practice of  obtaining multiple prescriptions from different medical providers.  If you have a chronic pain syndrome (i.e. chronic headaches, recurrent back or neck pain, dental pain, abdominal or pelvic pain without a specific diagnosis, or neuropathic pain such as fibromyalgia) or recurrent visits for the same condition without an acute diagnosis, you may be treated with non-narcotics and other non-addictive medicines.  Allergic reactions or negative side effects that may be reported by a patient to such medications will not typically lead to the use of a narcotic analgesic or other  controlled substance as an alternative.  Patients managing chronic pain with a personal physician should have provisions in place for breakthrough pain.  If you are in crisis, you should call your physician.  If your physician directs you to the emergency department, please have the doctor call and speak to our attending physician concerning your care.  When patients come to the Emergency Department (ED) with acute medical conditions in which the ED physician feels it is appropriate to prescribe narcotic or sedating pain medication, the physician will prescribe these in very limited quantities.  The amount of these medications will last only until you can see your primary care physician in his/her office.  Any patient who returns to the ED seeking refills should expect only non-narcotic pain medications.  In the event an acute medical condition exists and the emergency physician feels it is necessary that the patient be given a narcotic or sedating medication, a responsible adult driver should be present in the room prior to the medication being given by the nurse.  Prescriptions for narcotic or sedating medications that have been lost, stolen, or expired will NOT be refilled in the ED.  Patients who have chronic pain may receive non-narcotic prescriptions until seen by their primary care physician.  It is every patient's personal responsibility to maintain active prescriptions with his or her primary care physician or specialist.

## 2015-05-16 NOTE — ED Notes (Signed)
Pt to ed with c/o back pain and states the sciatica pain hurts too much and needs to be seen.

## 2015-05-20 ENCOUNTER — Encounter: Payer: Self-pay | Admitting: *Deleted

## 2015-05-20 ENCOUNTER — Emergency Department
Admission: EM | Admit: 2015-05-20 | Discharge: 2015-05-20 | Disposition: A | Discharge status: Home / Self Care | Payer: Medicaid Other | Attending: Emergency Medicine | Admitting: Emergency Medicine

## 2015-05-20 ENCOUNTER — Emergency Department: Payer: Medicaid Other

## 2015-05-20 DIAGNOSIS — Z720 Tobacco use: Secondary | ICD-10-CM | POA: Diagnosis not present

## 2015-05-20 DIAGNOSIS — W010XXA Fall on same level from slipping, tripping and stumbling without subsequent striking against object, initial encounter: Secondary | ICD-10-CM | POA: Insufficient documentation

## 2015-05-20 DIAGNOSIS — Y9389 Activity, other specified: Secondary | ICD-10-CM | POA: Diagnosis not present

## 2015-05-20 DIAGNOSIS — S8002XA Contusion of left knee, initial encounter: Secondary | ICD-10-CM | POA: Diagnosis not present

## 2015-05-20 DIAGNOSIS — Y998 Other external cause status: Secondary | ICD-10-CM | POA: Diagnosis not present

## 2015-05-20 DIAGNOSIS — Y92009 Unspecified place in unspecified non-institutional (private) residence as the place of occurrence of the external cause: Secondary | ICD-10-CM | POA: Diagnosis not present

## 2015-05-20 DIAGNOSIS — W19XXXA Unspecified fall, initial encounter: Secondary | ICD-10-CM

## 2015-05-20 DIAGNOSIS — S8992XA Unspecified injury of left lower leg, initial encounter: Secondary | ICD-10-CM | POA: Diagnosis present

## 2015-05-20 MED ORDER — TRAMADOL HCL 50 MG PO TABS
50.0000 mg | ORAL_TABLET | Freq: Once | ORAL | Status: DC
  Filled 2015-05-20: qty 1

## 2015-05-20 NOTE — ED Notes (Signed)
Pt fell from a standing position, while using her walker

## 2015-05-20 NOTE — ED Notes (Signed)
Pt uses a walker, tripped and fell, landing on her left knee. C/o pain to left knee. No deformity noted, distal pulse present.

## 2015-05-20 NOTE — ED Provider Notes (Signed)
[facility] Emergency Department Provider Note     Time seen: ----------------------------------------- 12:06 PM on 05/20/2015 -----------------------------------------    I have reviewed the triage vital signs and the nursing notes.   HISTORY  Chief Complaint No chief complaint on file.    HPI [name] is a 44 y.o. female who presents ER after a fall at home. Patient states she was using her walker when she tripped over her shoe which was on the ground. She states she subsequently then phalanx complaining of left knee pain. Patient states she can't walk at this time due to the pain. She is requesting pain medicine at this time. Pain in the left knee is severe   Past Medical History  Diagnosis Date  . Bipolar 1 disorder   . Other abdominal hernia     disk disease  . PTSD (post-traumatic stress disorder)   . Leg fracture   . Asthma   . GERD (gastroesophageal reflux disease)   . Scoliosis   . Fibromyalgia   . Agoraphobia with panic disorder   . COPD (chronic obstructive pulmonary disease)   . Depression     Patient Active Problem List   Diagnosis Date Noted  . Bilateral pneumonia 03/24/2015  . Acute respiratory failure 03/24/2015  . Pneumonia 03/24/2015  . Left tibial fracture 03/24/2015  . Chronic pain 03/24/2015    Past Surgical History  Procedure Laterality Date  . Other surgical history      left tib fib repair  . Other surgical history      left tib fib repair, has boot on  . Fracture surgery    . Appendectomy    . Cholecystectomy    . Tubal ligation      Allergies Sulfa antibiotics; Coconut oil; Other; and Tramadol  Social History Social History  Substance Use Topics  . Smoking status: Current Every Day Smoker -- 0.50 packs/day for 12 years  . Smokeless tobacco: Not on file  . Alcohol Use: No    Review of Systems Constitutional: Negative for fever. Eyes: Negative for visual changes. ENT: Negative for  sore throat. Cardiovascular: Negative for chest pain. Respiratory: Negative for shortness of breath. Gastrointestinal: Negative for abdominal pain, vomiting and diarrhea. Genitourinary: Negative for dysuria. Musculoskeletal: Positive for left knee pain  Skin: Negative for rash. Neurological: Negative for headaches, focal weakness or numbness.  10-point ROS otherwise negative.  ____________________________________________   PHYSICAL EXAM:  VITAL SIGNS: ED Triage Vitals  Enc Vitals Group     BP --      Pulse --      Resp --      Temp --      Temp src --      SpO2 --      Weight --      Height --      Head Cir --      Peak Flow --      Pain Score --      Pain Loc --      Pain Edu? --      Excl. in [place]? --     Constitutional: Alert and oriented. Well appearing and in no distress. Eyes: Conjunctivae are normal. PERRL. Normal extraocular movements. ENT   Head: Normocephalic and atraumatic.   Nose: No congestion/rhinnorhea.   Mouth/Throat: Mucous membranes are moist.   Neck: No stridor. Cardiovascular: Normal rate, regular rhythm. Normal and symmetric distal pulses are present in all extremities. No murmurs, rubs, or gallops. Respiratory: Normal  respiratory effort without tachypnea nor retractions. Breath sounds are clear and equal bilaterally. No wheezes/rales/rhonchi. Gastrointestinal: Soft and nontender. No distention. No abdominal bruits.  Musculoskeletal: Pain with range of motion of her extremities, left knee is tender to touch without signs of contusion or effusion Neurologic:  Normal speech and language. No gross focal neurologic deficits are appreciated. Speech is normal. No gait instability. Skin:  Skin is warm, dry and intact. No rash noted. Psychiatric: Mood and affect are normal. Speech and behavior are normal. Patient exhibits appropriate insight and judgment.  ____________________________________________  ED COURSE:  Pertinent labs & imaging  results that were available during my care of the patient were reviewed by me and considered in my medical decision making (see chart for details). Patient is in no acute distress, will receive left knee x-rays. ____________________________________________  RADIOLOGY Images were viewed by me  Left knee x-rays  IMPRESSION: Old posttraumatic and post operative changes. No acute bony abnormality. ____________________________________________  FINAL ASSESSMENT AND PLAN  Fall, knee contusion  Plan: Patient with labs and imaging as dictated above. X-rays are negative, she is stable for outpatient follow-up with her doctor.   Earleen Newport, MD   Earleen Newport, MD 05/20/15 [id number]

## 2015-05-20 NOTE — Discharge Instructions (Signed)
Contusion °A contusion is a deep bruise. Contusions are the result of an injury that caused bleeding under the skin. The contusion may turn blue, purple, or yellow. Minor injuries will give you a painless contusion, but more severe contusions may stay painful and swollen for a few weeks.  °CAUSES  °A contusion is usually caused by a blow, trauma, or direct force to an area of the body. °SYMPTOMS  °· Swelling and redness of the injured area. °· Bruising of the injured area. °· Tenderness and soreness of the injured area. °· Pain. °DIAGNOSIS  °The diagnosis can be made by taking a history and physical exam. An X-ray, CT scan, or MRI may be needed to determine if there were any associated injuries, such as fractures. °TREATMENT  °Specific treatment will depend on what area of the body was injured. In general, the best treatment for a contusion is resting, icing, elevating, and applying cold compresses to the injured area. Over-the-counter medicines may also be recommended for pain control. Ask your caregiver what the best treatment is for your contusion. °HOME CARE INSTRUCTIONS  °· Put ice on the injured area. °¨ Put ice in a plastic bag. °¨ Place a towel between your skin and the bag. °¨ Leave the ice on for 15-20 minutes, 3-4 times a day, or as directed by your health care provider. °· Only take over-the-counter or prescription medicines for pain, discomfort, or fever as directed by your caregiver. Your caregiver may recommend avoiding anti-inflammatory medicines (aspirin, ibuprofen, and naproxen) for 48 hours because these medicines may increase bruising. °· Rest the injured area. °· If possible, elevate the injured area to reduce swelling. °SEEK IMMEDIATE MEDICAL CARE IF:  °· You have increased bruising or swelling. °· You have pain that is getting worse. °· Your swelling or pain is not relieved with medicines. °MAKE SURE YOU:  °· Understand these instructions. °· Will watch your condition. °· Will get help right  away if you are not doing well or get worse. °Document Released: 06/29/2005 Document Revised: 09/24/2013 Document Reviewed: 07/25/2011 °[employer]® Patient Information ©2015 [employer]. This information is not intended to replace advice given to you by your health care provider. Make sure you discuss any questions you have with your health care provider. ° °

## 2015-05-20 NOTE — ED Notes (Signed)
Pt refused to sign E sign, refused VS, stated she was going to [place] to get her pain meds.  Informed Dr Jimmye Norman

## 2015-07-14 ENCOUNTER — Other Ambulatory Visit: Payer: Self-pay | Admitting: Anesthesiology

## 2015-07-14 DIAGNOSIS — M5136 Other intervertebral disc degeneration, lumbar region: Secondary | ICD-10-CM

## 2015-07-14 DIAGNOSIS — M545 Low back pain, unspecified: Secondary | ICD-10-CM

## 2015-07-16 ENCOUNTER — Inpatient Hospital Stay: Payer: Medicaid Other | Attending: Oncology

## 2015-07-23 ENCOUNTER — Ambulatory Visit
Admission: RE | Admit: 2015-07-23 | Discharge: 2015-07-23 | Disposition: A | Discharge status: Home / Self Care | Payer: Medicaid Other | Source: Ambulatory Visit | Attending: Anesthesiology | Admitting: Anesthesiology

## 2015-07-23 DIAGNOSIS — M545 Low back pain, unspecified: Secondary | ICD-10-CM

## 2015-07-23 DIAGNOSIS — X58XXXA Exposure to other specified factors, initial encounter: Secondary | ICD-10-CM | POA: Diagnosis not present

## 2015-07-23 DIAGNOSIS — T148 Other injury of unspecified body region: Secondary | ICD-10-CM | POA: Insufficient documentation

## 2015-07-23 DIAGNOSIS — M5136 Other intervertebral disc degeneration, lumbar region: Secondary | ICD-10-CM

## 2015-08-12 ENCOUNTER — Encounter: Payer: Self-pay | Admitting: Emergency Medicine

## 2015-08-12 ENCOUNTER — Emergency Department
Admission: EM | Admit: 2015-08-12 | Discharge: 2015-08-12 | Disposition: A | Discharge status: Home / Self Care | Payer: Medicaid Other | Attending: Emergency Medicine | Admitting: Emergency Medicine

## 2015-08-12 ENCOUNTER — Emergency Department: Payer: Medicaid Other

## 2015-08-12 DIAGNOSIS — Y9389 Activity, other specified: Secondary | ICD-10-CM | POA: Insufficient documentation

## 2015-08-12 DIAGNOSIS — Z7951 Long term (current) use of inhaled steroids: Secondary | ICD-10-CM | POA: Diagnosis not present

## 2015-08-12 DIAGNOSIS — S8002XA Contusion of left knee, initial encounter: Secondary | ICD-10-CM | POA: Diagnosis not present

## 2015-08-12 DIAGNOSIS — Y998 Other external cause status: Secondary | ICD-10-CM | POA: Insufficient documentation

## 2015-08-12 DIAGNOSIS — Z792 Long term (current) use of antibiotics: Secondary | ICD-10-CM | POA: Diagnosis not present

## 2015-08-12 DIAGNOSIS — W010XXA Fall on same level from slipping, tripping and stumbling without subsequent striking against object, initial encounter: Secondary | ICD-10-CM | POA: Insufficient documentation

## 2015-08-12 DIAGNOSIS — Z79899 Other long term (current) drug therapy: Secondary | ICD-10-CM | POA: Diagnosis not present

## 2015-08-12 DIAGNOSIS — Z720 Tobacco use: Secondary | ICD-10-CM | POA: Insufficient documentation

## 2015-08-12 DIAGNOSIS — S8992XA Unspecified injury of left lower leg, initial encounter: Secondary | ICD-10-CM | POA: Diagnosis present

## 2015-08-12 DIAGNOSIS — Z79890 Hormone replacement therapy (postmenopausal): Secondary | ICD-10-CM | POA: Insufficient documentation

## 2015-08-12 DIAGNOSIS — S80212A Abrasion, left knee, initial encounter: Secondary | ICD-10-CM | POA: Diagnosis not present

## 2015-08-12 DIAGNOSIS — Y9289 Other specified places as the place of occurrence of the external cause: Secondary | ICD-10-CM | POA: Insufficient documentation

## 2015-08-12 DIAGNOSIS — Z791 Long term (current) use of non-steroidal anti-inflammatories (NSAID): Secondary | ICD-10-CM | POA: Diagnosis not present

## 2015-08-12 MED ORDER — IBUPROFEN 800 MG PO TABS: 800.0000 mg | ORAL_TABLET | Freq: Three times a day (TID) | ORAL | Status: DC | PRN

## 2015-08-12 MED ORDER — OXYCODONE-ACETAMINOPHEN 5-325 MG PO TABS
1.0000 | ORAL_TABLET | Freq: Once | ORAL | Status: AC
  Administered 2015-08-12: 1 via ORAL
  Filled 2015-08-12: qty 1

## 2015-08-12 MED ORDER — IBUPROFEN 800 MG PO TABS
800.0000 mg | ORAL_TABLET | Freq: Once | ORAL | Status: AC
  Administered 2015-08-12: 800 mg via ORAL
  Filled 2015-08-12: qty 1

## 2015-08-12 MED ORDER — OXYCODONE-ACETAMINOPHEN 7.5-325 MG PO TABS: 1.0000 | ORAL_TABLET | ORAL | Status: DC | PRN

## 2015-08-12 NOTE — ED Notes (Signed)
Injury to left knee yesterday

## 2015-08-12 NOTE — ED Provider Notes (Addendum)
[facility] Emergency Department Provider Note  ____________________________________________  Time seen: Approximately 1:20 PM  I have reviewed the triage vital signs and the nursing notes.   HISTORY  Chief Complaint Knee Pain    HPI [name] is a 44 y.o. female patient complaining of pain and edema to the left knee secondary to a trip and fall yesterday. Patient states she landed on her left knee. Patient stated pain is not controlled with taking over-the-counter Tylenol. Patient rates the pain as a 6/10. Patient described the pain as sharp. No other palliative measures for this complaint.   Past Medical History  Diagnosis Date  . Bipolar 1 disorder ([place])   . Other abdominal hernia     disk disease  . PTSD (post-traumatic stress disorder)   . Leg fracture   . Asthma   . GERD (gastroesophageal reflux disease)   . Scoliosis   . Fibromyalgia   . Agoraphobia with panic disorder   . COPD (chronic obstructive pulmonary disease) ([place])   . Depression     Patient Active Problem List   Diagnosis Date Noted  . Bilateral pneumonia 03/24/2015  . Acute respiratory failure ([place]) 03/24/2015  . Pneumonia 03/24/2015  . Left tibial fracture 03/24/2015  . Chronic pain 03/24/2015    Past Surgical History  Procedure Laterality Date  . Other surgical history      left tib fib repair  . Other surgical history      left tib fib repair, has boot on  . Fracture surgery    . Appendectomy    . Cholecystectomy    . Tubal ligation      Current Outpatient Rx  Name  Route  Sig  Dispense  Refill  . ALPRAZolam (XANAX) 0.5 MG tablet   Oral   Take 2 mg by mouth 3 (three) times daily.         Marland Kitchen amphetamine-dextroamphetamine (ADDERALL) 10 MG tablet   Oral   Take 10 mg by mouth daily.         . cetirizine (ZYRTEC) 10 MG tablet   Oral   Take 10 mg by mouth daily.       0   . cyclobenzaprine (FLEXERIL) 10 MG tablet   Oral   Take 10 mg by mouth  every 12 (twelve) hours.      0   . doxycycline (VIBRAMYCIN) 50 MG capsule   Oral   Take 2 capsules (100 mg total) by mouth 2 (two) times daily.   28 capsule   0   . EPINEPHrine (EPIPEN 2-PAK) 0.3 mg/0.3 mL IJ SOAJ injection   Intramuscular   Inject 0.3 mg into the muscle as needed.          . fluconazole (DIFLUCAN) 150 MG tablet   Oral   Take 1 tablet (150 mg total) by mouth daily.   6 tablet   0   . furosemide (LASIX) 40 MG tablet   Oral   Take 40 mg by mouth daily.       0   . gabapentin (NEURONTIN) 400 MG capsule   Oral   Take 400 mg by mouth 3 (three) times daily.       0   . HYDROcodone-acetaminophen (NORCO) 5-325 MG per tablet   Oral   Take 1-2 tablets by mouth every 4 (four) hours as needed for moderate pain.   10 tablet   0   . hydrOXYzine (VISTARIL) 50 MG capsule   Oral  Take 1 capsule by mouth 4 (four) times daily.      0   . ibuprofen (ADVIL,MOTRIN) 800 MG tablet   Oral   Take 1 tablet (800 mg total) by mouth every 8 (eight) hours as needed.   30 tablet   0   . ketoconazole (NIZORAL) 2 % cream   Topical   Apply 1 application topically 2 (two) times daily.      0   . methocarbamol (ROBAXIN) 500 MG tablet   Oral   Take 1 tablet (500 mg total) by mouth every 6 (six) hours as needed for muscle spasms.   30 tablet   0   . mometasone (NASONEX) 50 MCG/ACT nasal spray   Nasal   Place 2 sprays into the nose daily.      0   . mometasone (NASONEX) 50 MCG/ACT nasal spray      2 sprays by Each Nare route daily.         . naproxen (EC NAPROSYN) 500 MG EC tablet   Oral   Take 1 tablet (500 mg total) by mouth 2 (two) times daily with a meal.   60 tablet   0   . Olopatadine HCl (PATADAY) 0.2 % SOLN   Both Eyes   Place 1 drop into both eyes daily.         Marland Kitchen omeprazole (PRILOSEC) 40 MG capsule   Oral   Take 40 mg by mouth daily.       0   . oxyCODONE-acetaminophen (PERCOCET) 7.5-325 MG tablet   Oral   Take 1 tablet by mouth  every 4 (four) hours as needed for severe pain.   20 tablet   0   . PREMARIN 0.625 MG tablet   Oral   Take 0.625 mg by mouth daily.       0     Dispense as written.   Marland Kitchen PROAIR HFA 108 (90 BASE) MCG/ACT inhaler   Inhalation   Inhale 2 puffs into the lungs every 4 (four) hours as needed.       0     Dispense as written.   Marland Kitchen QUEtiapine (SEROQUEL) 300 MG tablet   Oral   Take 600 mg by mouth at bedtime.      0   . Vitamin D, Ergocalciferol, (DRISDOL) 50000 UNITS CAPS capsule   Oral   Take 50,000 Units by mouth once a week.      0     Allergies Sulfa antibiotics; Coconut oil; Other; and Tramadol  No family history on file.  Social History Social History  Substance Use Topics  . Smoking status: Current Every Day Smoker -- 0.50 packs/day for 12 years  . Smokeless tobacco: None  . Alcohol Use: No    Review of Systems Constitutional: No fever/chills Eyes: No visual changes. ENT: No sore throat. Cardiovascular: Denies chest pain. Respiratory: Denies shortness of breath. Gastrointestinal: No abdominal pain.  No nausea, no vomiting.  No diarrhea.  No constipation. Genitourinary: Negative for dysuria. Musculoskeletal: Left knee pain. Skin: Negative for rash. Neurological: Negative for headaches, focal weakness or numbness. 10-point ROS otherwise negative.  ____________________________________________   PHYSICAL EXAM:  VITAL SIGNS: ED Triage Vitals  Enc Vitals Group     BP 08/12/15 1259 129/80 mmHg     Pulse Rate 08/12/15 1259 100     Resp 08/12/15 1259 16     Temp 08/12/15 1259 98.6 F (37 C)     Temp Source 08/12/15 1259 Oral  SpO2 08/12/15 1259 97 %     Weight 08/12/15 1259 198 lb (89.812 kg)     Height 08/12/15 1259 5\' 2"  (1.575 m)     Head Cir --      Peak Flow --      Pain Score 08/12/15 1255 6     Pain Loc --      Pain Edu? --      Excl. in [place]? --     Constitutional: Alert and oriented. Well appearing and in no acute distress. Eyes:  Conjunctivae are normal. PERRL. EOMI. Head: Atraumatic. Nose: No congestion/rhinnorhea. Mouth/Throat: Mucous membranes are moist.  Oropharynx non-erythematous. Neck: No stridor.  No cervical spine tenderness to palpation. Hematological/Lymphatic/Immunilogical: No cervical lymphadenopathy. Cardiovascular: Normal rate, regular rhythm. Grossly normal heart sounds.  Good peripheral circulation. Respiratory: Normal respiratory effort.  No retractions. Lungs CTAB. Gastrointestinal: Soft and nontender. No distention. No abdominal bruits. No CVA tenderness. Musculoskeletal: No deformity mild edema to the anterior patella. Tender palpation with marked crepitus. Neurologic:  Normal speech and language. No gross focal neurologic deficits are appreciated. No gait instability. Skin:  Skin is warm, dry and intact. No rash noted. Abrasion anterior left knee. Psychiatric: Mood and affect are normal. Speech and behavior are normal.  ____________________________________________   LABS (all labs ordered are listed, but only abnormal results are displayed)  Labs Reviewed - No data to display ____________________________________________  EKG   ____________________________________________  RADIOLOGY  No acute findings on the left knee x-ray. I, Sable Feil, personally viewed and evaluated these images (plain radiographs) as part of my medical decision making.   ____________________________________________   PROCEDURES  Procedure(s) performed: None  Critical Care performed: No  ____________________________________________   INITIAL IMPRESSION / ASSESSMENT AND PLAN / ED COURSE  Pertinent labs & imaging results that were available during my care of the patient were reviewed by me and considered in my medical decision making (see chart for details).  Left knee contusion abrasion. Discussed x-ray findings with patient. Patient given home care instructions. Patient given a prescription for  Percocets and ibuprofen. Advised to follow-up with PCP if condition persists return back to ER only if condition worsens. ____________________________________________ Addendum by Pierce Crane. Izumi Mixon, PA-C-Cancelled Percocet prescription. Patient received #90 Oxycodone 5mg  on 07/28/2015  FINAL CLINICAL IMPRESSION(S) / ED DIAGNOSES  Final diagnoses:  Knee contusion, left, initial encounter      Sable Feil, PA-C 08/12/15 1415  Daymon Larsen, MD 08/12/15 [place], PA-C 08/12/15 1614  Daymon Larsen, MD 08/12/15 1620

## 2015-08-12 NOTE — Discharge Instructions (Signed)

## 2015-08-14 ENCOUNTER — Other Ambulatory Visit: Payer: Medicaid Other

## 2015-08-17 ENCOUNTER — Inpatient Hospital Stay: Payer: Medicaid Other | Attending: Oncology

## 2015-09-22 ENCOUNTER — Inpatient Hospital Stay: Payer: Medicaid Other | Admitting: Oncology

## 2015-09-22 ENCOUNTER — Inpatient Hospital Stay: Payer: Medicaid Other | Attending: Oncology

## 2015-10-07 ENCOUNTER — Emergency Department: Payer: Medicaid Other

## 2015-10-07 ENCOUNTER — Emergency Department
Admission: EM | Admit: 2015-10-07 | Discharge: 2015-10-07 | Disposition: A | Discharge status: Home / Self Care | Payer: Medicaid Other | Attending: Emergency Medicine | Admitting: Emergency Medicine

## 2015-10-07 DIAGNOSIS — W07XXXA Fall from chair, initial encounter: Secondary | ICD-10-CM | POA: Diagnosis not present

## 2015-10-07 DIAGNOSIS — S8002XA Contusion of left knee, initial encounter: Secondary | ICD-10-CM | POA: Diagnosis not present

## 2015-10-07 DIAGNOSIS — Z791 Long term (current) use of non-steroidal anti-inflammatories (NSAID): Secondary | ICD-10-CM | POA: Insufficient documentation

## 2015-10-07 DIAGNOSIS — R091 Pleurisy: Secondary | ICD-10-CM | POA: Insufficient documentation

## 2015-10-07 DIAGNOSIS — Z7951 Long term (current) use of inhaled steroids: Secondary | ICD-10-CM | POA: Diagnosis not present

## 2015-10-07 DIAGNOSIS — Z792 Long term (current) use of antibiotics: Secondary | ICD-10-CM | POA: Diagnosis not present

## 2015-10-07 DIAGNOSIS — Z79899 Other long term (current) drug therapy: Secondary | ICD-10-CM | POA: Insufficient documentation

## 2015-10-07 DIAGNOSIS — S8992XA Unspecified injury of left lower leg, initial encounter: Secondary | ICD-10-CM | POA: Diagnosis present

## 2015-10-07 DIAGNOSIS — Y9289 Other specified places as the place of occurrence of the external cause: Secondary | ICD-10-CM | POA: Insufficient documentation

## 2015-10-07 DIAGNOSIS — Y998 Other external cause status: Secondary | ICD-10-CM | POA: Insufficient documentation

## 2015-10-07 DIAGNOSIS — F17200 Nicotine dependence, unspecified, uncomplicated: Secondary | ICD-10-CM | POA: Diagnosis not present

## 2015-10-07 DIAGNOSIS — Y9389 Activity, other specified: Secondary | ICD-10-CM | POA: Diagnosis not present

## 2015-10-07 MED ORDER — MELOXICAM 15 MG PO TABS: 15.0000 mg | ORAL_TABLET | Freq: Every day | ORAL | Status: DC

## 2015-10-07 MED ORDER — OXYCODONE-ACETAMINOPHEN 5-325 MG PO TABS: 1.0000 | ORAL_TABLET | ORAL | Status: DC | PRN

## 2015-10-07 MED ORDER — HYDROMORPHONE HCL 1 MG/ML IJ SOLN
1.0000 mg | Freq: Once | INTRAMUSCULAR | Status: AC
  Administered 2015-10-07: 1 mg via INTRAMUSCULAR
  Filled 2015-10-07: qty 1

## 2015-10-07 NOTE — ED Provider Notes (Signed)
[facility] Emergency Department Provider Note  ____________________________________________  Time seen: Approximately 1:53 PM  I have reviewed the triage vital signs and the nursing notes.   HISTORY  Chief Complaint Knee Pain and Pleurisy    HPI [name] is a 45 y.o. female presents for evaluation of left knee pain. Patient reports that she was to a chair when his legs gave out and she fell on her "bad knee". Patient has a past medical history of surgery to her left lower leg with rods and screws. Complains of continuous knee pain. In addition patient states that it hurts for her to take a deep breath and feels like she's got some inflammation in her lungs.   Past Medical History  Diagnosis Date  . Bipolar 1 disorder ([place])   . Other abdominal hernia     disk disease  . PTSD (post-traumatic stress disorder)   . Leg fracture   . Asthma   . GERD (gastroesophageal reflux disease)   . Scoliosis   . Fibromyalgia   . Agoraphobia with panic disorder   . COPD (chronic obstructive pulmonary disease) ([place])   . Depression     Patient Active Problem List   Diagnosis Date Noted  . Bilateral pneumonia 03/24/2015  . Acute respiratory failure ([place]) 03/24/2015  . Pneumonia 03/24/2015  . Left tibial fracture 03/24/2015  . Chronic pain 03/24/2015    Past Surgical History  Procedure Laterality Date  . Other surgical history      left tib fib repair  . Other surgical history      left tib fib repair, has boot on  . Fracture surgery    . Appendectomy    . Cholecystectomy    . Tubal ligation      Current Outpatient Rx  Name  Route  Sig  Dispense  Refill  . ALPRAZolam (XANAX) 0.5 MG tablet   Oral   Take 2 mg by mouth 3 (three) times daily.         Marland Kitchen amphetamine-dextroamphetamine (ADDERALL) 10 MG tablet   Oral   Take 10 mg by mouth daily.         . cetirizine (ZYRTEC) 10 MG tablet   Oral   Take 10 mg by mouth daily.       0   .  cyclobenzaprine (FLEXERIL) 10 MG tablet   Oral   Take 10 mg by mouth every 12 (twelve) hours.      0   . doxycycline (VIBRAMYCIN) 50 MG capsule   Oral   Take 2 capsules (100 mg total) by mouth 2 (two) times daily.   28 capsule   0   . EPINEPHrine (EPIPEN 2-PAK) 0.3 mg/0.3 mL IJ SOAJ injection   Intramuscular   Inject 0.3 mg into the muscle as needed.          . fluconazole (DIFLUCAN) 150 MG tablet   Oral   Take 1 tablet (150 mg total) by mouth daily.   6 tablet   0   . furosemide (LASIX) 40 MG tablet   Oral   Take 40 mg by mouth daily.       0   . gabapentin (NEURONTIN) 400 MG capsule   Oral   Take 400 mg by mouth 3 (three) times daily.       0   . hydrOXYzine (VISTARIL) 50 MG capsule   Oral   Take 1 capsule by mouth 4 (four) times daily.  0   . ibuprofen (ADVIL,MOTRIN) 800 MG tablet   Oral   Take 1 tablet (800 mg total) by mouth every 8 (eight) hours as needed.   30 tablet   0   . ketoconazole (NIZORAL) 2 % cream   Topical   Apply 1 application topically 2 (two) times daily.      0   . meloxicam (MOBIC) 15 MG tablet   Oral   Take 1 tablet (15 mg total) by mouth daily.   30 tablet   0   . methocarbamol (ROBAXIN) 500 MG tablet   Oral   Take 1 tablet (500 mg total) by mouth every 6 (six) hours as needed for muscle spasms.   30 tablet   0   . mometasone (NASONEX) 50 MCG/ACT nasal spray   Nasal   Place 2 sprays into the nose daily.      0   . mometasone (NASONEX) 50 MCG/ACT nasal spray      2 sprays by Each Nare route daily.         . naproxen (EC NAPROSYN) 500 MG EC tablet   Oral   Take 1 tablet (500 mg total) by mouth 2 (two) times daily with a meal.   60 tablet   0   . Olopatadine HCl (PATADAY) 0.2 % SOLN   Both Eyes   Place 1 drop into both eyes daily.         Marland Kitchen omeprazole (PRILOSEC) 40 MG capsule   Oral   Take 40 mg by mouth daily.       0   . oxyCODONE-acetaminophen (ROXICET) 5-325 MG tablet   Oral   Take 1-2  tablets by mouth every 4 (four) hours as needed for severe pain.   15 tablet   0   . PREMARIN 0.625 MG tablet   Oral   Take 0.625 mg by mouth daily.       0     Dispense as written.   Marland Kitchen PROAIR HFA 108 (90 BASE) MCG/ACT inhaler   Inhalation   Inhale 2 puffs into the lungs every 4 (four) hours as needed.       0     Dispense as written.   Marland Kitchen QUEtiapine (SEROQUEL) 300 MG tablet   Oral   Take 600 mg by mouth at bedtime.      0   . Vitamin D, Ergocalciferol, (DRISDOL) 50000 UNITS CAPS capsule   Oral   Take 50,000 Units by mouth once a week.      0     Allergies Sulfa antibiotics; Coconut oil; Other; and Tramadol  No family history on file.  Social History Social History  Substance Use Topics  . Smoking status: Current Every Day Smoker -- 0.50 packs/day for 12 years  . Smokeless tobacco: None  . Alcohol Use: No    Review of Systems Constitutional: No fever/chills Eyes: No visual changes. ENT: No sore throat. Cardiovascular: Denies chest pain. Respiratory: Denies shortness of breath. Positive with inspiration pain. Gastrointestinal: No abdominal pain.  No nausea, no vomiting.  No diarrhea.  No constipation. Genitourinary: Negative for dysuria. Musculoskeletal: Positive for right knee pain. Skin: Negative for rash. Neurological: Negative for headaches, focal weakness or numbness.  10-point ROS otherwise negative.  ____________________________________________   PHYSICAL EXAM:  VITAL SIGNS: ED Triage Vitals  Enc Vitals Group     BP 10/07/15 1318 134/82 mmHg     Pulse Rate 10/07/15 1318 105     Resp 10/07/15  1318 18     Temp 10/07/15 1318 97.8 F (36.6 C)     Temp Source 10/07/15 1318 Oral     SpO2 10/07/15 1318 97 %     Weight 10/07/15 1318 196 lb (88.905 kg)     Height 10/07/15 1318 5\' 3"  (1.6 m)     Head Cir --      Peak Flow --      Pain Score 10/07/15 1318 8     Pain Loc --      Pain Edu? --      Excl. in [place]? --     Constitutional: Alert  and oriented. Well appearing and in no acute distress.   Cardiovascular: Normal rate, regular rhythm. Grossly normal heart sounds.  Good peripheral circulation. Respiratory: Normal respiratory effort.  No retractions. Lungs CTAB. Gastrointestinal: Soft and nontender. No distention. No abdominal bruits. No CVA tenderness. Musculoskeletal: Minimal lower extremity tenderness to the left inferior patella with no edema.  No joint effusions. Negative warmth Neurologic:  Normal speech and language. No gross focal neurologic deficits are appreciated. No gait instability. Skin:  Skin is warm, dry and intact. No rash noted. Psychiatric: Mood and affect are normal. Speech and behavior are normal.  ____________________________________________   LABS (all labs ordered are listed, but only abnormal results are displayed)  Labs Reviewed - No data to display ____________________________________________   RADIOLOGY  IMPRESSION: Prior LEFT tibial nailing.  Osseous demineralization with minimal degenerative changes LEFT knee. ____________________________________________   PROCEDURES  Procedure(s) performed: None  Critical Care performed: No  ____________________________________________   INITIAL IMPRESSION / ASSESSMENT AND PLAN / ED COURSE  Pertinent labs & imaging results that were available during my care of the patient were reviewed by me and considered in my medical decision making (see chart for details).  Acute left knee contusion. Reassurance provided to the patient and she is encouraged to follow up with her PCP, pain management as scheduled. Rx given for meloxicam 15 mg daily, Rx given for Percocet 5/325. Reviewed the [place] controlled substance reporting system done prior to prescribing. Patient to keep her pain management appointment as scheduled. She voices no other emergency medical complaints at this visit. ____________________________________________   FINAL  CLINICAL IMPRESSION(S) / ED DIAGNOSES  Final diagnoses:  Knee contusion, left, initial encounter      Arlyss Repress, PA-C 10/07/15 [place], MD 10/08/15 2326

## 2015-10-07 NOTE — ED Notes (Signed)
Pt was standing on chair in bathroom when 2 of the legs gave out and patient landed on her left knee which is "my bad knee"  Pt also c/o both sides hurting that started an hour after her fall. Patient goes to a pain center in [place] which she does not go back until Jan 13.  Pt states "I don't mind being admitted it hurts so bad." Pt does have a rod and screws in left leg.

## 2015-10-07 NOTE — ED Notes (Signed)
Pt states she was standing on a chair and fell. Pt c/o left knee pain and right rib pain

## 2015-10-13 ENCOUNTER — Encounter: Payer: Self-pay | Admitting: General Surgery

## 2015-10-13 ENCOUNTER — Emergency Department: Payer: Medicaid Other

## 2015-10-13 ENCOUNTER — Emergency Department
Admission: EM | Admit: 2015-10-13 | Discharge: 2015-10-13 | Disposition: A | Discharge status: Home / Self Care | Payer: Medicaid Other | Attending: Emergency Medicine | Admitting: Emergency Medicine

## 2015-10-13 DIAGNOSIS — T7411XA Adult physical abuse, confirmed, initial encounter: Secondary | ICD-10-CM | POA: Insufficient documentation

## 2015-10-13 DIAGNOSIS — M542 Cervicalgia: Secondary | ICD-10-CM | POA: Insufficient documentation

## 2015-10-13 DIAGNOSIS — M4120 Other idiopathic scoliosis, site unspecified: Secondary | ICD-10-CM | POA: Insufficient documentation

## 2015-10-13 DIAGNOSIS — R945 Abnormal results of liver function studies: Secondary | ICD-10-CM | POA: Insufficient documentation

## 2015-10-13 DIAGNOSIS — F4312 Post-traumatic stress disorder, chronic: Secondary | ICD-10-CM | POA: Insufficient documentation

## 2015-10-13 DIAGNOSIS — Y9389 Activity, other specified: Secondary | ICD-10-CM | POA: Diagnosis not present

## 2015-10-13 DIAGNOSIS — W1839XA Other fall on same level, initial encounter: Secondary | ICD-10-CM | POA: Diagnosis not present

## 2015-10-13 DIAGNOSIS — Z79899 Other long term (current) drug therapy: Secondary | ICD-10-CM | POA: Insufficient documentation

## 2015-10-13 DIAGNOSIS — Y998 Other external cause status: Secondary | ICD-10-CM | POA: Insufficient documentation

## 2015-10-13 DIAGNOSIS — Z79891 Long term (current) use of opiate analgesic: Secondary | ICD-10-CM | POA: Insufficient documentation

## 2015-10-13 DIAGNOSIS — Z7951 Long term (current) use of inhaled steroids: Secondary | ICD-10-CM | POA: Diagnosis not present

## 2015-10-13 DIAGNOSIS — D696 Thrombocytopenia, unspecified: Secondary | ICD-10-CM | POA: Insufficient documentation

## 2015-10-13 DIAGNOSIS — F17200 Nicotine dependence, unspecified, uncomplicated: Secondary | ICD-10-CM | POA: Diagnosis not present

## 2015-10-13 DIAGNOSIS — G894 Chronic pain syndrome: Secondary | ICD-10-CM | POA: Insufficient documentation

## 2015-10-13 DIAGNOSIS — Z791 Long term (current) use of non-steroidal anti-inflammatories (NSAID): Secondary | ICD-10-CM | POA: Insufficient documentation

## 2015-10-13 DIAGNOSIS — Z792 Long term (current) use of antibiotics: Secondary | ICD-10-CM | POA: Insufficient documentation

## 2015-10-13 DIAGNOSIS — M1712 Unilateral primary osteoarthritis, left knee: Secondary | ICD-10-CM | POA: Diagnosis not present

## 2015-10-13 DIAGNOSIS — Y9289 Other specified places as the place of occurrence of the external cause: Secondary | ICD-10-CM | POA: Insufficient documentation

## 2015-10-13 DIAGNOSIS — S8992XA Unspecified injury of left lower leg, initial encounter: Secondary | ICD-10-CM | POA: Diagnosis present

## 2015-10-13 DIAGNOSIS — J441 Chronic obstructive pulmonary disease with (acute) exacerbation: Secondary | ICD-10-CM | POA: Insufficient documentation

## 2015-10-13 DIAGNOSIS — R7989 Other specified abnormal findings of blood chemistry: Secondary | ICD-10-CM | POA: Insufficient documentation

## 2015-10-13 DIAGNOSIS — F409 Phobic anxiety disorder, unspecified: Secondary | ICD-10-CM | POA: Insufficient documentation

## 2015-10-13 DIAGNOSIS — R161 Splenomegaly, not elsewhere classified: Secondary | ICD-10-CM | POA: Insufficient documentation

## 2015-10-13 NOTE — ED Notes (Signed)
Pt states she was seen here for knee pain last week, states she fell again this morning at 1am because it just keeps buckling.Marland Kitchen

## 2015-10-13 NOTE — ED Provider Notes (Signed)
CSN: [id number]     Arrival date & time 10/13/15  1253 History   First MD Initiated Contact with Patient 10/13/15 1412     Chief Complaint  Patient presents with  . Knee Pain     HPI Comments: 45 year old female presents today complaining of left knee pain secondary to fall that occurred last night. Pt reports she has chronic problems with her knee - it buckles and gives way often. Has had a prior surgery to this knee many years ago. Was prescribed percocet for the same issue a week ago from the ER. Saw her PCP Dr. Brunetta Genera today and he is unable to write her for pain pills. Has orthopedic and pain clinic appointments scheduled this month.    Past Medical History  Diagnosis Date  . Bipolar 1 disorder ([place])   . Other abdominal hernia     disk disease  . PTSD (post-traumatic stress disorder)   . Leg fracture   . Asthma   . GERD (gastroesophageal reflux disease)   . Scoliosis   . Fibromyalgia   . Agoraphobia with panic disorder   . COPD (chronic obstructive pulmonary disease) ([place])   . Depression    Past Surgical History  Procedure Laterality Date  . Other surgical history      left tib fib repair  . Other surgical history      left tib fib repair, has boot on  . Fracture surgery    . Appendectomy    . Cholecystectomy    . Tubal ligation     No family history on file. Social History  Substance Use Topics  . Smoking status: Current Every Day Smoker -- 0.50 packs/day for 12 years  . Smokeless tobacco: Not on file  . Alcohol Use: No   OB History    No data available     Review of Systems  Musculoskeletal: Positive for myalgias and arthralgias. Negative for joint swelling.  Skin: Negative for wound.  All other systems reviewed and are negative.     Allergies  Sulfa antibiotics; Coconut oil; Other; and Tramadol  Home Medications   Prior to Admission medications   Medication Sig Start Date End Date Taking? Authorizing Provider  ADVAIR DISKUS 250-50 MCG/DOSE AEPB  Take 1 tablet by mouth. 09/18/15   Historical Provider, MD  ALPRAZolam Duanne Moron) 0.5 MG tablet Take 2 mg by mouth 3 (three) times daily. 03/24/14   Historical Provider, MD  AMITIZA 24 MCG capsule Take 1 tablet by mouth. 09/21/15   Historical Provider, MD  amphetamine-dextroamphetamine (ADDERALL) 10 MG tablet Take 10 mg by mouth daily.    Historical Provider, MD  amphetamine-dextroamphetamine (ADDERALL) 20 MG tablet Take 40 mg by mouth every morning. 09/21/15   Historical Provider, MD  benzonatate (TESSALON) 200 MG capsule Take 1 capsule by mouth. 10/06/15   Historical Provider, MD  cefdinir (OMNICEF) 300 MG capsule Take 1 capsule by mouth 1 day or 1 dose. 10/06/15   Historical Provider, MD  cetirizine (ZYRTEC) 10 MG tablet Take 10 mg by mouth daily.  03/12/15   Historical Provider, MD  cyclobenzaprine (FLEXERIL) 10 MG tablet Take 10 mg by mouth every 12 (twelve) hours. 03/16/15   Historical Provider, MD  doxycycline (VIBRAMYCIN) 50 MG capsule Take 2 capsules (100 mg total) by mouth 2 (two) times daily. 04/01/15   Ahmed Prima, MD  EPINEPHrine (EPIPEN 2-PAK) 0.3 mg/0.3 mL IJ SOAJ injection Inject 0.3 mg into the muscle as needed.  11/19/14   Historical Provider,  MD  fluconazole (DIFLUCAN) 100 MG tablet Take 100 mg by mouth daily. 10/06/15   Historical Provider, MD  fluconazole (DIFLUCAN) 150 MG tablet Take 1 tablet (150 mg total) by mouth daily. 03/26/15   Max Sane, MD  fluticasone (FLONASE) 50 MCG/ACT nasal spray Place 2 sprays into both nostrils 1 day or 1 dose. 08/21/15   Historical Provider, MD  furosemide (LASIX) 40 MG tablet Take 40 mg by mouth daily.  03/12/15   Historical Provider, MD  gabapentin (NEURONTIN) 400 MG capsule Take 400 mg by mouth 3 (three) times daily.  03/12/15   Historical Provider, MD  hydrOXYzine (VISTARIL) 50 MG capsule Take 1 capsule by mouth 4 (four) times daily. 03/12/15   Historical Provider, MD  ibuprofen (ADVIL,MOTRIN) 800 MG tablet Take 1 tablet (800 mg total) by mouth every 8  (eight) hours as needed. 08/12/15   Sable Feil, PA-C  ketoconazole (NIZORAL) 2 % cream Apply 1 application topically 2 (two) times daily. 03/12/15   Historical Provider, MD  meloxicam (MOBIC) 15 MG tablet Take 1 tablet (15 mg total) by mouth daily. 10/07/15   Pierce Crane Beers, PA-C  methocarbamol (ROBAXIN) 500 MG tablet Take 1 tablet (500 mg total) by mouth every 6 (six) hours as needed for muscle spasms. 04/28/15   Pierce Crane Beers, PA-C  mometasone (NASONEX) 50 MCG/ACT nasal spray Place 2 sprays into the nose daily. 03/12/15   Historical Provider, MD  mometasone (NASONEX) 50 MCG/ACT nasal spray 2 sprays by Each Nare route daily.    Historical Provider, MD  naproxen (EC NAPROSYN) 500 MG EC tablet Take 1 tablet (500 mg total) by mouth 2 (two) times daily with a meal. 04/28/15   Pierce Crane Beers, PA-C  NEXIUM 40 MG capsule Take 40 mg by mouth daily. 09/15/15   Historical Provider, MD  Olopatadine HCl (PATADAY) 0.2 % SOLN Place 1 drop into both eyes daily. 10/15/14   Historical Provider, MD  omeprazole (PRILOSEC) 40 MG capsule Take 40 mg by mouth daily.  03/12/15   Historical Provider, MD  ondansetron (ZOFRAN) 8 MG tablet Take 1 tablet by mouth. 10/06/15   Historical Provider, MD  oxyCODONE (OXY IR/ROXICODONE) 5 MG immediate release tablet Take 5 mg by mouth 3 (three) times daily. 07/14/15   Historical Provider, MD  Oxycodone HCl 10 MG TABS Take 10 mg by mouth 3 (three) times daily. 08/25/15   Historical Provider, MD  oxyCODONE-acetaminophen (PERCOCET) 7.5-325 MG tablet Take 1 tablet by mouth. 09/07/15   Historical Provider, MD  oxyCODONE-acetaminophen (ROXICET) 5-325 MG tablet Take 1-2 tablets by mouth every 4 (four) hours as needed for severe pain. 10/07/15   Pierce Crane Beers, PA-C  PREMARIN 0.625 MG tablet Take 0.625 mg by mouth daily.  03/12/15   Historical Provider, MD  PROAIR HFA 108 (90 BASE) MCG/ACT inhaler Inhale 2 puffs into the lungs every 4 (four) hours as needed.  03/12/15   Historical Provider, MD   QUEtiapine (SEROQUEL) 300 MG tablet Take 600 mg by mouth at bedtime. 04/09/15   Historical Provider, MD  rOPINIRole (REQUIP) 0.5 MG tablet Take 1 tablet by mouth 1 day or 1 dose. 09/17/15   Historical Provider, MD  SM NICOTINE 7 MG/24HR patch 1 patch. 09/16/15   Historical Provider, MD  SPIRIVA HANDIHALER 18 MCG inhalation capsule Place 2 puffs into inhaler and inhale. 09/18/15   Historical Provider, MD  TIVORBEX 20 MG CAPS Take 1 capsule by mouth 1 day or 1 dose. 10/06/15   Historical Provider, MD  Vitamin D, Ergocalciferol, (DRISDOL) 50000 UNITS CAPS capsule Take 50,000 Units by mouth once a week. 03/12/15   Historical Provider, MD   BP 136/88 mmHg  Pulse 97  Temp(Src) 98.7 F (37.1 C) (Oral)  Resp 20  Ht 5\' 3"  (1.6 m)  Wt 92.534 kg  BMI 36.15 kg/m2  SpO2 95%  LMP 09/07/2015 Physical Exam  Constitutional: She is oriented to person, place, and time. Vital signs are normal. She appears well-developed and well-nourished. She is active.  Non-toxic appearance. She does not have a sickly appearance. She does not appear ill.  HENT:  Head: Normocephalic and atraumatic.  Musculoskeletal: Normal range of motion. She exhibits tenderness.       Left knee: She exhibits swelling. She exhibits normal range of motion, no effusion, no LCL laxity, normal patellar mobility, normal meniscus and no MCL laxity. Tenderness found.  Neurological: She is alert and oriented to person, place, and time. She exhibits normal muscle tone.  Skin: Skin is warm and dry.  Psychiatric: She has a normal mood and affect. Her behavior is normal. Judgment and thought content normal.  Nursing note and vitals reviewed.   ED Course  Procedures (including critical care time) Labs Review Labs Reviewed - No data to display  Imaging Review Dg Knee Complete 4 Views Left  10/13/2015  CLINICAL DATA:  Left knee weakness and pain, initial encounter, no current injury EXAM: LEFT KNEE - COMPLETE 4+ VIEW COMPARISON:  10/07/2015 FINDINGS:  Medullary rod is again noted in the left tibia. Healed proximal left fibular fracture is noted. No joint effusion is seen. No acute fracture or dislocation is noted. IMPRESSION: Changes consistent with prior trauma and surgical fixation. No acute abnormality noted. Electronically Signed   By: Inez Catalina M.D.   On: 10/13/2015 15:26   I have personally reviewed and evaluated these images and lab results as part of my medical decision-making.   EKG Interpretation None      MDM  Reviewed records and pt has history of chronic pain syndrome and has multiple pain related complaints to this department. No indication for narcotics based on XRAY findings and history. Follow up as scheduled with orthopedics and pain management.  Final diagnoses:  Arthritis of left knee  Chronic pain syndrome        Harvest Dark, PA-C 10/13/15 1623  Harvest Dark, MD 10/14/15 1001

## 2015-10-13 NOTE — Discharge Instructions (Signed)
  Arthritis Arthritis means joint pain. It can also mean joint disease. A joint is a place where bones come together. People who have arthritis may have:  Red joints.  Swollen joints.  Stiff joints.  Warm joints.  A fever.  A feeling of being sick. HOME CARE Pay attention to any changes in your symptoms. Take these actions to help with your pain and swelling. Medicines  Take over-the-counter and prescription medicines only as told by your doctor.  Do not take aspirin for pain if your doctor says that you may have gout. Activities  Rest your joint if your doctor tells you to.  Avoid activities that make the pain worse.  Exercise your joint regularly as told by your doctor. Try doing exercises like:  Swimming.  Water aerobics.  Biking.  Walking. Joint Care  If your joint is swollen, keep it raised (elevated) if told by your doctor.  If your joint feels stiff in the morning, try taking a warm shower.  If you have diabetes, do not apply heat without asking your doctor.  If told, apply heat to the joint:  Put a towel between the joint and the hot pack or heating pad.  Leave the heat on the area for 20-30 minutes.  If told, apply ice to the joint:  Put ice in a plastic bag.  Place a towel between your skin and the bag.  Leave the ice on for 20 minutes, 2-3 times per day.  Keep all follow-up visits as told by your doctor. GET HELP IF:  The pain gets worse.  You have a fever. GET HELP RIGHT AWAY IF:  You have very bad pain in your joint.  You have swelling in your joint.  Your joint is red.  Many joints become painful and swollen.  You have very bad back pain.  Your leg is very weak.  You cannot control your pee (urine) or poop (stool).   This information is not intended to replace advice given to you by your health care provider. Make sure you discuss any questions you have with your health care provider.   Document Released: 12/14/2009  Document Revised: 06/10/2015 Document Reviewed: 12/15/2014 Elsevier Interactive Patient Education 2016 [employer]vier [employer].  

## 2015-10-13 NOTE — ED Notes (Addendum)
States she was seen last week s/p fall   Then fell again on same knee  Unable to bear full wt to left knee

## 2015-10-15 ENCOUNTER — Encounter: Payer: Self-pay | Admitting: General Surgery

## 2015-10-15 ENCOUNTER — Ambulatory Visit (INDEPENDENT_AMBULATORY_CARE_PROVIDER_SITE_OTHER): Payer: Medicaid Other | Admitting: General Surgery

## 2015-10-15 VITALS — BP 130/83 | HR 82 | Temp 97.7°F | Ht 63.0 in | Wt 203.3 lb

## 2015-10-15 DIAGNOSIS — K439 Ventral hernia without obstruction or gangrene: Secondary | ICD-10-CM

## 2015-10-15 NOTE — Patient Instructions (Signed)
We will send you to [facility] to be evaluated for this hernia given your low platelet count.  I will send your records to [facility], they will review them, and call you with an appointment. If you have not heard from them in 5 business days, please call our office so that we can check on the status of this appointment.

## 2015-10-15 NOTE — Progress Notes (Signed)
Surgical Consultation  10/15/2015  Shylia Guido Broxterman is an 45 y.o. female.   Chief Complaint  Patient presents with  . Abdominal Pain     HPI:  45 year old female presents to clinic for evaluation of a abdominal wall hernia that is in her right upper quadrant. Patient states is been there for 2 years. She states that he constantly hurt. Patient also has history of chronic pain and narcotic abuse. Patient states that there are times where he "goes up under her rib ". She states he only makes it better is pain medication. Patient also states she's currently being worked up for a type of lymphoma and has had thrombocytopenia with a very low platelet count in the recent past. Patient states that she doesn't think it ever been stuck out as the area changes in size frequently throughout the day and night. She denies any current fevers, chills , nausea, vomiting , chest pain, shortness of breath , diarrhea, constipation.  Past Medical History  Diagnosis Date  . Bipolar 1 disorder ([place])   . Other abdominal hernia     disk disease  . PTSD (post-traumatic stress disorder)   . Leg fracture   . Asthma   . GERD (gastroesophageal reflux disease)   . Scoliosis   . Fibromyalgia   . Agoraphobia with panic disorder   . COPD (chronic obstructive pulmonary disease) ([place])   . Depression     Past Surgical History  Procedure Laterality Date  . Other surgical history  07/2014    left tib fib repair  . Other surgical history  07/2014    right tib fib repair  . Fracture surgery    . Appendectomy  1999  . Cholecystectomy  1999  . Tubal ligation  1999    Family History  Problem Relation Age of Onset  . COPD Mother   . Heart disease Mother   . Parkinson's disease Father     Social History:  reports that she quit smoking 3 days ago. She has never used smokeless tobacco. She reports that she does not drink alcohol or use illicit drugs.  Allergies:  Allergies  Allergen Reactions  . Sulfa  Antibiotics Shortness Of Breath  . Coconut Oil Swelling  . Other     Steroids, causes hallunications  . Tramadol Anxiety    Medications reviewed.     ROS  a multipoint review of systems was completed, all pertinent positives and negatives within the history of present illness remainder negative.    BP 130/83 mmHg  Pulse 82  Temp(Src) 97.7 F (36.5 C)  Ht 5\' 3"  (1.6 m)  Wt 92.216 kg (203 lb 4.8 oz)  BMI 36.02 kg/m2  LMP 09/07/2015  Physical Exam  Gen.: No acute distress Chest: Clear to auscultation Heart: Regular rate and rhythm  Abdomen: Large , soft , nondistended. Obvious hernia in the right paramedian area of the right upper quadrant. The area is soft and able be reduced however tender on reduction. Unable to determine size of fascial defects due to patient's body habitus.  Extremities: Moves all surgeries well.   No results found for this or any previous visit (from the past 48 hour(s)). No results found.  Assessment/Plan: 1. Hernia of abdominal wall  45 year old female with a right upper quadrant, paramedian hernia. This is been present for at least 2 years since it was visualized on the CT scan 2 years ago. Given the patient's other medical problems that she is currently being worked up for including  her thrombocytopenia it was discussed with her that it would be safer for her to undergo surgical care had a higher level of care. Discussed procedure reasons that she'll be better served had a local University read here. We'll place referral into the hernia center at [facility].   Clayburn Pert, MD FACS General Surgeon dermatitis

## 2015-10-19 ENCOUNTER — Telehealth: Payer: Self-pay | Admitting: General Surgery

## 2015-10-19 ENCOUNTER — Other Ambulatory Visit: Payer: Self-pay

## 2015-10-19 ENCOUNTER — Telehealth: Payer: Self-pay

## 2015-10-19 MED ORDER — KETOROLAC TROMETHAMINE 10 MG PO TABS: 10.0000 mg | ORAL_TABLET | Freq: Four times a day (QID) | ORAL | Status: DC | PRN

## 2015-10-19 NOTE — Telephone Encounter (Signed)
Returned phone call to patient at this time. She states that we are trying to kill her for giving her Tramadol when she is allergic to it. I explained that she had told us at her appointment that it causes her anxiety to become worse but no other reactions, she verifies that this is the case. I explained that I have talked with Dr. Azalee Course who is on call for today and she is suggesting Toradol 10mg  q6h prn to help with pain. Patient states, "I tried this about 10 years ago and this does not help." I told her I would be glad to send in the Toradol to help with this or she may try Ibuprofen or Tylenol. She then states, "I have had an ulcer in the past and can't take NSAIDS and Tylenol will hurt my liver. Arnetha Gula are really trying to kill a girl now." I explained that she was taking Percocet recently and was not concerned and that has Tylenol in it. "Well, I don't think it's much Tylenol. So what are you saying? You are not going to help me with this pain?" I once again explained that the doctor has ordered Toradol and I would be glad to send this in to her pharmacy or she may try Ibuprofen or Tylenol. She then states, "Fine, send it in. I will try it but I know it's not gonna help." and hangs up the phone abruptly.  Toradol was sent to Pharmacy per Dr. Geoffry Paradise orders.

## 2015-10-19 NOTE — Telephone Encounter (Signed)
Patient would like to speak with the nurse, she was here Thursday and saw Dr Adonis Huguenin. He prescribed her Tramadol which she states she is allergic too and that it also states that in her chart. She is also checking on the status of her referral to [facility]. Please call and advise.

## 2015-10-19 NOTE — Telephone Encounter (Signed)
Please refer patient to [facility] for Right Upper Quadrant Hernia that is very painful.

## 2015-10-20 ENCOUNTER — Ambulatory Visit: Payer: Medicaid Other | Admitting: Oncology

## 2015-10-20 ENCOUNTER — Other Ambulatory Visit: Payer: Medicaid Other

## 2015-10-20 NOTE — Telephone Encounter (Signed)
I have called [facility] hernia center for referral. No answer. I have left a message on the referral line @ [phone]. If a call back is not received by the end of the business day I will follow back up with [facility] hernia center 10/21/15.

## 2015-10-21 NOTE — Telephone Encounter (Signed)
I have faxed a referral form with clinic notes to [facility] hernia center @ [phone]. I have advised patient that [facility] will contact her within 72 hours with an appointment date and time. I have also given her the phone number to the [facility] [phone]) to call if she does not receive a call back. I will follow up with the patient at the end of the week to make sure she has been contacted. Patient was ok with this information.

## 2015-10-22 ENCOUNTER — Inpatient Hospital Stay: Payer: Medicaid Other

## 2015-10-22 ENCOUNTER — Inpatient Hospital Stay: Payer: Medicaid Other | Admitting: Oncology

## 2015-10-22 NOTE — Telephone Encounter (Signed)
Patient states she called [facility] and they don't have any information on her. She is requesting you to call them to make sure they have received the referral and to call and let her know.

## 2015-10-23 NOTE — Telephone Encounter (Signed)
i have called patient back. She was at the [facility] hernia center at this time for her appointment.

## 2015-10-25 ENCOUNTER — Emergency Department: Payer: Medicaid Other

## 2015-10-25 ENCOUNTER — Encounter: Payer: Self-pay | Admitting: Emergency Medicine

## 2015-10-25 ENCOUNTER — Emergency Department
Admission: EM | Admit: 2015-10-25 | Discharge: 2015-10-25 | Disposition: A | Discharge status: Home / Self Care | Payer: Medicaid Other | Attending: Student | Admitting: Student

## 2015-10-25 DIAGNOSIS — R1011 Right upper quadrant pain: Secondary | ICD-10-CM | POA: Insufficient documentation

## 2015-10-25 DIAGNOSIS — F1510 Other stimulant abuse, uncomplicated: Secondary | ICD-10-CM | POA: Diagnosis not present

## 2015-10-25 DIAGNOSIS — G8929 Other chronic pain: Secondary | ICD-10-CM | POA: Insufficient documentation

## 2015-10-25 DIAGNOSIS — Z87891 Personal history of nicotine dependence: Secondary | ICD-10-CM | POA: Insufficient documentation

## 2015-10-25 DIAGNOSIS — Z79899 Other long term (current) drug therapy: Secondary | ICD-10-CM | POA: Insufficient documentation

## 2015-10-25 DIAGNOSIS — F1310 Sedative, hypnotic or anxiolytic abuse, uncomplicated: Secondary | ICD-10-CM | POA: Diagnosis not present

## 2015-10-25 DIAGNOSIS — Z3202 Encounter for pregnancy test, result negative: Secondary | ICD-10-CM | POA: Insufficient documentation

## 2015-10-25 DIAGNOSIS — R4781 Slurred speech: Secondary | ICD-10-CM | POA: Diagnosis not present

## 2015-10-25 DIAGNOSIS — R112 Nausea with vomiting, unspecified: Secondary | ICD-10-CM | POA: Insufficient documentation

## 2015-10-25 DIAGNOSIS — Z7951 Long term (current) use of inhaled steroids: Secondary | ICD-10-CM | POA: Insufficient documentation

## 2015-10-25 DIAGNOSIS — R109 Unspecified abdominal pain: Secondary | ICD-10-CM

## 2015-10-25 DIAGNOSIS — F1110 Opioid abuse, uncomplicated: Secondary | ICD-10-CM | POA: Insufficient documentation

## 2015-10-25 DIAGNOSIS — F1410 Cocaine abuse, uncomplicated: Secondary | ICD-10-CM | POA: Insufficient documentation

## 2015-10-25 LAB — URINALYSIS COMPLETE WITH MICROSCOPIC (ARMC ONLY)
BILIRUBIN URINE: NEGATIVE
Bacteria, UA: NONE SEEN
GLUCOSE, UA: NEGATIVE mg/dL
Hgb urine dipstick: NEGATIVE
KETONES UR: NEGATIVE mg/dL
Leukocytes, UA: NEGATIVE
NITRITE: NEGATIVE
PH: 6 (ref 5.0–8.0)
Protein, ur: NEGATIVE mg/dL
Specific Gravity, Urine: 1.021 (ref 1.005–1.030)

## 2015-10-25 LAB — URINE DRUG SCREEN, QUALITATIVE (ARMC ONLY)
AMPHETAMINES, UR SCREEN: POSITIVE — AB
BARBITURATES, UR SCREEN: NOT DETECTED
Benzodiazepine, Ur Scrn: POSITIVE — AB
COCAINE METABOLITE, UR ~~LOC~~: POSITIVE — AB
Cannabinoid 50 Ng, Ur ~~LOC~~: NOT DETECTED
MDMA (ECSTASY) UR SCREEN: NOT DETECTED
METHADONE SCREEN, URINE: NOT DETECTED
OPIATE, UR SCREEN: POSITIVE — AB
Phencyclidine (PCP) Ur S: NOT DETECTED
TRICYCLIC, UR SCREEN: POSITIVE — AB

## 2015-10-25 LAB — COMPREHENSIVE METABOLIC PANEL
ALK PHOS: 126 U/L (ref 38–126)
ALT: 42 U/L (ref 14–54)
AST: 44 U/L — AB (ref 15–41)
Albumin: 4.1 g/dL (ref 3.5–5.0)
Anion gap: 5 (ref 5–15)
BUN: 18 mg/dL (ref 6–20)
CALCIUM: 9.2 mg/dL (ref 8.9–10.3)
CO2: 24 mmol/L (ref 22–32)
CREATININE: 0.75 mg/dL (ref 0.44–1.00)
Chloride: 109 mmol/L (ref 101–111)
GFR calc non Af Amer: >60 mL/min (ref >60)
Glucose, Bld: 99 mg/dL (ref 65–99)
Potassium: 3.7 mmol/L (ref 3.5–5.1)
SODIUM: 138 mmol/L (ref 135–145)
Total Bilirubin: 1.7 mg/dL — ABNORMAL HIGH (ref 0.3–1.2)
Total Protein: 7.2 g/dL (ref 6.5–8.1)

## 2015-10-25 LAB — CBC WITH DIFFERENTIAL/PLATELET
Basophils Absolute: 0 10*3/uL (ref 0–0.1)
Basophils Relative: 0 %
EOS ABS: 0.1 10*3/uL (ref 0–0.7)
Eosinophils Relative: 2 %
HCT: 36 % (ref 35.0–47.0)
HEMOGLOBIN: 12.2 g/dL (ref 12.0–16.0)
LYMPHS ABS: 1.2 10*3/uL (ref 1.0–3.6)
LYMPHS PCT: 20 %
MCH: 31.3 pg (ref 26.0–34.0)
MCHC: 33.8 g/dL (ref 32.0–36.0)
MCV: 92.5 fL (ref 80.0–100.0)
Monocytes Absolute: 0.6 10*3/uL (ref 0.2–0.9)
Monocytes Relative: 10 %
NEUTROS PCT: 68 %
Neutro Abs: 4.2 10*3/uL (ref 1.4–6.5)
Platelets: 110 10*3/uL — ABNORMAL LOW (ref 150–440)
RBC: 3.89 MIL/uL (ref 3.80–5.20)
RDW: 15 % — ABNORMAL HIGH (ref 11.5–14.5)
WBC: 6.1 10*3/uL (ref 3.6–11.0)

## 2015-10-25 LAB — PREGNANCY, URINE: Preg Test, Ur: NEGATIVE

## 2015-10-25 LAB — LIPASE, BLOOD: Lipase: 21 U/L (ref 11–51)

## 2015-10-25 MED ORDER — SODIUM CHLORIDE 0.9 % IV BOLUS (SEPSIS)
500.0000 mL | Freq: Once | INTRAVENOUS | Status: AC
Start: 2015-10-25 — End: 2015-10-25
  Administered 2015-10-25: 500 mL via INTRAVENOUS

## 2015-10-25 MED ORDER — ONDANSETRON HCL 4 MG/2ML IJ SOLN
INTRAMUSCULAR | Status: AC
  Administered 2015-10-25: 4 mg via INTRAVENOUS
  Filled 2015-10-25: qty 2

## 2015-10-25 MED ORDER — ONDANSETRON HCL 4 MG/2ML IJ SOLN
4.0000 mg | Freq: Once | INTRAMUSCULAR | Status: AC
  Administered 2015-10-25: 4 mg via INTRAVENOUS

## 2015-10-25 MED ORDER — IOHEXOL 300 MG/ML  SOLN
125.0000 mL | Freq: Once | INTRAMUSCULAR | Status: AC | PRN
  Administered 2015-10-25: 125 mL via INTRAVENOUS

## 2015-10-25 MED ORDER — IOHEXOL 240 MG/ML SOLN
25.0000 mL | Freq: Once | INTRAMUSCULAR | Status: AC | PRN
  Administered 2015-10-25: 25 mL via ORAL

## 2015-10-25 NOTE — ED Notes (Signed)
Ems pt to rm 25 from home with report of pain to her hernia in her abd and states she wants to have it removed tonight. Pt reports she was here last week and told her platelets were to low to do surgery.

## 2015-10-25 NOTE — ED Notes (Addendum)
This RN went to DC pt.  Pt and pts husband state that reason pt was not getting pain medication was because "he's black".  Husband stated that "you are lying, cause last time I was here I got pain meds".  This RN directed pt to policy posted on wall.  Pt told this RN that "this will cost you your job".  Pt and pt husband stated that they had been recording conversations w/o consent.  Pt left w/o signing ED DC
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
## 2015-10-25 NOTE — ED Notes (Signed)
Pt lying in bed on stomach, asleep.  Repositioned pt to draw blood. Rearranged blankets on pt.  MD Edd Fabian at bedside.

## 2015-10-25 NOTE — ED Notes (Addendum)
Pt sitting up in bed, able to speak w/o difficulty, more lucid and comprehensive. Explained to pt that MD Edd Fabian had been in an emergency traffic and would see pt as soon as she was able.  MD Edd Fabian notified of pts desire to see her.

## 2015-10-25 NOTE — ED Notes (Addendum)
This RN in room w/ MD Edd Fabian.  Pt alert, oriented, able to clothe self w/o assistance. Pt expressing dissatisfaction at idea of D/C.  MD Edd Fabian explained that pt was appropriate to DC and needed to follow up w/ PCP.  Pt expressed dissatisfaction at being DC w/o pain medication.  MD Edd Fabian explained hospital policy as to why pt would be DC w/o pain medication.  Pt gave conflicting information stating that [facility] surgeon rx pain medication when she was DC from [facility] ED and that she was allergic to acetaminophen despite taking percocet.   Pt breathing w/o difficulty, NAD and to ambulate freely.

## 2015-10-25 NOTE — ED Notes (Signed)
Pt assisted to toilet 

## 2015-10-25 NOTE — ED Notes (Signed)
Husband at bedside; pt asking for something to eat while she's waiting for MD; pt understands nothing to eat or drink until seen and cleared by MD

## 2015-10-25 NOTE — ED Notes (Signed)
Pt called out to complain that she had not been seen by MD Edd Fabian yet, stating that it had been 45 minutes since I had said MD Edd Fabian would come to pts room.  Informed pt that it had been 15 minutes and that MD Edd Fabian would see her at earliest convenience.

## 2015-10-25 NOTE — ED Notes (Signed)
Pt called out d/t needing to be cleaned up. Assisted pt with cleanup.

## 2015-10-25 NOTE — ED Notes (Signed)
Yellow arm band and yellow nonskid socks already in place to indicate fall risk

## 2015-10-30 ENCOUNTER — Telehealth: Payer: Self-pay | Admitting: General Surgery

## 2015-10-30 NOTE — Telephone Encounter (Signed)
Patient left a message on voicemail requesting to speak with nurse. Please call at your convenience.

## 2015-10-30 NOTE — Telephone Encounter (Signed)
Returned phone call to patient at this time. Went straight to recording asking me to leave a voicemail but voicemail was full. Will be glad to speak with patient if she indeed calls back.

## 2015-11-02 ENCOUNTER — Telehealth: Payer: Self-pay | Admitting: General Surgery

## 2015-11-02 NOTE — Telephone Encounter (Signed)
Returned phone call to patient at this time. Spoke to patient briefly and then husband abruptly took the phone and said, "I am extremely worried about my wife because no one is doing anything to help her." I explained that we had sent the patient to [facility] due to the work-up on her Lymphoma and Thrombocytopenia and the lack of blood products for the OR at [facility]. He said that he has been told so many different things that I just want Dr. Adonis Huguenin to call me himself. I explained that I could get a message to him, however, he would need to follow-up with [facility] as stated in their last note, the patient was supposed to follow-up there on 10/27/15 but patient states that she knew nothing about this appointment. Re-iterated multiple times that the patient would need to follow-up at [facility]. Patient's spouse states, "We have been recording all of our conversations at our appointments and in the Emergency Rooms because we are really concerned about this hernia and we keep getting the run around."   I explained once again, that he needs to call [facility] in regards to follow-up appointment and find out how they would like the patient to proceed from here.  He then states, "Well, I will call them but if Dr. Adonis Huguenin does not call me back then I will come up there myself." and hung up the phone.

## 2015-11-02 NOTE — Telephone Encounter (Signed)
Patients husband left a message over the weekend, he would like to speak with someone concerning his wife, [name]. She saw the surgeon at the [facility] and they understand him to be saying she does not need surgery. She is still in a lot of pain and now wanting to know what to do. He states this is an emergency to them because she is in so much pain from the hernia. Please call and advise. Thanks.

## 2015-11-05 ENCOUNTER — Inpatient Hospital Stay: Payer: Medicaid Other | Attending: Oncology | Admitting: *Deleted

## 2015-11-05 ENCOUNTER — Inpatient Hospital Stay (HOSPITAL_BASED_OUTPATIENT_CLINIC_OR_DEPARTMENT_OTHER): Payer: Medicaid Other | Admitting: Oncology

## 2015-11-05 VITALS — BP 137/98 | HR 90 | Temp 98.1°F | Resp 16 | Wt 203.3 lb

## 2015-11-05 DIAGNOSIS — Z87891 Personal history of nicotine dependence: Secondary | ICD-10-CM | POA: Insufficient documentation

## 2015-11-05 DIAGNOSIS — Z79899 Other long term (current) drug therapy: Secondary | ICD-10-CM | POA: Diagnosis not present

## 2015-11-05 DIAGNOSIS — K219 Gastro-esophageal reflux disease without esophagitis: Secondary | ICD-10-CM | POA: Diagnosis not present

## 2015-11-05 DIAGNOSIS — F319 Bipolar disorder, unspecified: Secondary | ICD-10-CM | POA: Insufficient documentation

## 2015-11-05 DIAGNOSIS — D696 Thrombocytopenia, unspecified: Secondary | ICD-10-CM

## 2015-11-05 DIAGNOSIS — M419 Scoliosis, unspecified: Secondary | ICD-10-CM | POA: Diagnosis not present

## 2015-11-05 DIAGNOSIS — M797 Fibromyalgia: Secondary | ICD-10-CM | POA: Diagnosis not present

## 2015-11-05 DIAGNOSIS — F4310 Post-traumatic stress disorder, unspecified: Secondary | ICD-10-CM | POA: Insufficient documentation

## 2015-11-05 DIAGNOSIS — J45909 Unspecified asthma, uncomplicated: Secondary | ICD-10-CM | POA: Diagnosis not present

## 2015-11-05 DIAGNOSIS — J449 Chronic obstructive pulmonary disease, unspecified: Secondary | ICD-10-CM | POA: Insufficient documentation

## 2015-11-05 LAB — CBC WITH DIFFERENTIAL/PLATELET
BASOS ABS: 0 10*3/uL (ref 0–0.1)
BASOS PCT: 0 %
Eosinophils Absolute: 0.1 10*3/uL (ref 0–0.7)
Eosinophils Relative: 2 %
HEMATOCRIT: 36.3 % (ref 35.0–47.0)
HEMOGLOBIN: 12.6 g/dL (ref 12.0–16.0)
LYMPHS PCT: 27 %
Lymphs Abs: 1.5 10*3/uL (ref 1.0–3.6)
MCH: 31.4 pg (ref 26.0–34.0)
MCHC: 34.6 g/dL (ref 32.0–36.0)
MCV: 90.7 fL (ref 80.0–100.0)
MONO ABS: 0.3 10*3/uL (ref 0.2–0.9)
MONOS PCT: 5 %
NEUTROS ABS: 3.9 10*3/uL (ref 1.4–6.5)
NEUTROS PCT: 66 %
Platelets: 78 10*3/uL — ABNORMAL LOW (ref 150–440)
RBC: 4.01 MIL/uL (ref 3.80–5.20)
RDW: 14.4 % (ref 11.5–14.5)
WBC: 5.8 10*3/uL (ref 3.6–11.0)

## 2015-11-05 NOTE — Progress Notes (Signed)
[place]  [phone] [phone]  ID: [name] OB: Aug 25, 1971  MR#: [id number]  [id number]  Patient Care Team: Vista Mink, FNP as PCP - General (Family Medicine)  CHIEF COMPLAINT:  Chief Complaint  Patient presents with  . thrombocytopenia    INTERVAL HISTORY: Patient returns to clinic today for repeat laboratory work and further evaluation. She continues to have multiple medical complaints which are all chronic and unchanged in nature. She denies any easy bleeding or bruising. She has no neurologic complaints. She denies any fevers.  She has a good appetite and denies weight loss.  She denies any chest pain or shortness of breath. She denies any nausea, vomiting, constipation, or diarrhea. She has no urinary complaints. Patient offers no further specific complaints.  REVIEW OF SYSTEMS:   Review of Systems  Constitutional: Negative.   Respiratory: Negative.   Cardiovascular: Negative.   Gastrointestinal: Negative.   Genitourinary: Negative.   Endo/Heme/Allergies: Negative.   Psychiatric/Behavioral: Negative.     As per HPI. Otherwise, a complete review of systems is negatve.  PAST MEDICAL HISTORY: Past Medical History  Diagnosis Date  . Bipolar 1 disorder ([place])   . Other abdominal hernia     disk disease  . PTSD (post-traumatic stress disorder)   . Leg fracture   . Asthma   . GERD (gastroesophageal reflux disease)   . Scoliosis   . Fibromyalgia   . Agoraphobia with panic disorder   . COPD (chronic obstructive pulmonary disease) ([place])   . Depression     PAST SURGICAL HISTORY: Past Surgical History  Procedure Laterality Date  . Other surgical history  07/2014    left tib fib repair  . Other surgical history  07/2014    right tib fib repair  . Fracture surgery    . Appendectomy  1999  . Cholecystectomy  1999  . Tubal ligation  1999    FAMILY HISTORY: CVA, CAD, depression,  bipolar.     ADVANCED DIRECTIVES:    HEALTH MAINTENANCE: Social History  Substance Use Topics  . Smoking status: Former Smoker -- 0.50 packs/day for 12 years    Quit date: 10/12/2015  . Smokeless tobacco: Never Used  . Alcohol Use: No     Colonoscopy:  PAP:  Bone density:  Lipid panel:  Allergies  Allergen Reactions  . Sulfa Antibiotics Shortness Of Breath  . Coconut Oil Swelling  . Nsaids Other (See Comments)    restless  . Other     Steroids, causes hallunications  . Tramadol Anxiety    Current Outpatient Prescriptions  Medication Sig Dispense Refill  . ADVAIR DISKUS 250-50 MCG/DOSE AEPB Inhale 1 puff into the lungs daily.   0  . ALPRAZolam (XANAX) 0.5 MG tablet Take 1 mg by mouth 3 (three) times daily as needed.     . AMITIZA 24 MCG capsule Take 24 mcg by mouth daily with breakfast.   0  . amphetamine-dextroamphetamine (ADDERALL) 20 MG tablet Take 40 mg by mouth every morning.  0  . cetirizine (ZYRTEC) 10 MG tablet Take 10 mg by mouth daily.   0  . cyclobenzaprine (FLEXERIL) 10 MG tablet Take 10 mg by mouth 3 (three) times daily as needed.   0  . EPINEPHrine (EPIPEN 2-PAK) 0.3 mg/0.3 mL IJ SOAJ injection Inject 0.3 mg into the muscle as needed.     . furosemide (LASIX) 40 MG tablet Take 40 mg by mouth daily.   0  . gabapentin (NEURONTIN) 400  MG capsule Take 400 mg by mouth 3 (three) times daily.   0  . hydrOXYzine (VISTARIL) 50 MG capsule Take 1 capsule by mouth every 6 (six) hours as needed.   0  . ketoconazole (NIZORAL) 2 % cream Apply 1 application topically 2 (two) times daily.  0  . ketorolac (TORADOL) 10 MG tablet Take 1 tablet (10 mg total) by mouth every 6 (six) hours as needed. 20 tablet 0  . mometasone (NASONEX) 50 MCG/ACT nasal spray Place 2 sprays into the nose daily.  0  . NEXIUM 40 MG capsule Take 40 mg by mouth daily.  0  . Olopatadine HCl (PATADAY) 0.2 % SOLN Place 1 drop into both eyes daily.    . ondansetron (ZOFRAN) 8 MG tablet Take 8 mg by mouth  every 8 (eight) hours as needed.   0  . oxyCODONE-acetaminophen (ROXICET) 5-325 MG tablet Take 1-2 tablets by mouth every 4 (four) hours as needed for severe pain. 15 tablet 0  . PROAIR HFA 108 (90 BASE) MCG/ACT inhaler Inhale 2 puffs into the lungs every 4 (four) hours as needed.   0  . QUEtiapine (SEROQUEL) 300 MG tablet Take 600 mg by mouth at bedtime.  0  . rOPINIRole (REQUIP) 0.5 MG tablet Take 1 tablet by mouth 1 day or 1 dose.  0  . SPIRIVA HANDIHALER 18 MCG inhalation capsule Place 2 puffs into inhaler and inhale.  0  . TIVORBEX 20 MG CAPS Take 1 capsule by mouth 2 (two) times daily.   0  . traMADol (ULTRAM) 50 MG tablet Take 1 tablet by mouth every 6 (six) hours as needed.  0  . Vitamin D, Ergocalciferol, (DRISDOL) 50000 UNITS CAPS capsule Take 50,000 Units by mouth once a week.  0  . benzonatate (TESSALON) 200 MG capsule Take 1 capsule by mouth 3 (three) times daily as needed. Reported on 11/05/2015  0  . cefdinir (OMNICEF) 300 MG capsule Take 1 capsule by mouth 2 (two) times daily. Reported on 11/05/2015  0   No current facility-administered medications for this visit.    OBJECTIVE: Filed Vitals:   11/05/15 1220  BP: 137/98  Pulse: 90  Temp: 98.1 F (36.7 C)  Resp: 16     Body mass index is 36.02 kg/(m^2).    ECOG FS:0 - Asymptomatic  General: Well-developed, well-nourished, no acute distress. Eyes: Pink conjunctiva, anicteric sclera. Lungs: Clear to auscultation bilaterally. Heart: Regular rate and rhythm. No rubs, murmurs, or gallops. Abdomen: Soft, nontender, nondistended. No organomegaly noted, normoactive bowel sounds. Musculoskeletal: No edema, cyanosis, or clubbing. Neuro: Alert, answering all questions appropriately. Cranial nerves grossly intact. Skin: No rashes or petechiae noted. Psych: Normal affect.  LAB RESULTS:  Lab Results  Component Value Date   NA 138 10/25/2015   K 3.7 10/25/2015   CL 109 10/25/2015   CO2 24 10/25/2015   GLUCOSE 99 10/25/2015    BUN 18 10/25/2015   CREATININE 0.75 10/25/2015   CALCIUM 9.2 10/25/2015   PROT 7.2 10/25/2015   ALBUMIN 4.1 10/25/2015   AST 44* 10/25/2015   ALT 42 10/25/2015   ALKPHOS 126 10/25/2015   BILITOT 1.7* 10/25/2015   GFRNONAA >60 10/25/2015   GFRAA >60 10/25/2015    Lab Results  Component Value Date   WBC 5.8 11/05/2015   NEUTROABS 3.9 11/05/2015   HGB 12.6 11/05/2015   HCT 36.3 11/05/2015   MCV 90.7 11/05/2015   PLT 78* 11/05/2015     STUDIES: Ct Head Wo Contrast  10/25/2015  CLINICAL DATA:  Altered mental status EXAM: CT HEAD WITHOUT CONTRAST TECHNIQUE: Contiguous axial images were obtained from the base of the skull through the vertex without intravenous contrast. COMPARISON:  02/18/2015 FINDINGS: The bony calvarium is intact. The ventricles are of normal size and configuration. No findings to suggest acute hemorrhage, acute infarction or space-occupying mass lesion are noted. IMPRESSION: No acute intracranial abnormality noted. Electronically Signed   By: Inez Catalina M.D.   On: 10/25/2015 09:46   Ct Abdomen Pelvis W Contrast  10/25/2015  CLINICAL DATA:  Abdominal and pelvic pain.  History of hernia. EXAM: CT ABDOMEN AND PELVIS WITH CONTRAST TECHNIQUE: Multidetector CT imaging of the abdomen and pelvis was performed using the standard protocol following bolus administration of intravenous contrast. CONTRAST:  11mL OMNIPAQUE IOHEXOL 300 MG/ML  SOLN COMPARISON:  CT abdomen pelvis 11/26/2013 FINDINGS: Lower chest:  No acute findings. Hepatobiliary: No masses or other significant abnormality. Post cholecystectomy. Pancreas: No mass, inflammatory changes, or other significant abnormality. Spleen: Within normal limits in size and appearance. Adrenals/Urinary Tract: No masses identified. No evidence of hydronephrosis. Stomach/Bowel: No evidence of obstruction, inflammatory process, or abnormal fluid collections. The appendix is surgically absent. Vascular/Lymphatic: No pathologically enlarged  lymph nodes. No evidence of abdominal aortic aneurysm. Shotty retroperitoneal lymph nodes are noted. Reproductive: The ovaries have normal appearance. Fallopian tube closure devices are noted. The uterus is retroverted. Other: There is a supraumbilical midline anterior abdominal wall hernia containing omental fat, which demonstrates mild stranding. The hernia neck measures 22 mm. Musculoskeletal: No suspicious bone lesions identified. Osteoarthritic changes are present with Schmorl's nodes within the superior endplates of QA348G, 624THL and T12 vertebral bodies. IMPRESSION: No evidence of acute abnormalities within the abdomen or pelvis. Wide neck fat containing supraumbilical midline anterior abdominal wall hernia with mild inflammatory changes of the contained omental fat. Electronically Signed   By: Fidela Salisbury M.D.   On: 10/25/2015 09:39   Dg Chest Portable 1 View  10/25/2015  CLINICAL DATA:  Pain at level of abdominal hernia and mental status changes. EXAM: PORTABLE CHEST 1 VIEW COMPARISON:  03/24/2015 FINDINGS: The heart size is at the upper limits of normal. Previously seen bilateral infiltrates have cleared. There is some residual scarring and/or atelectasis in the left lower lung. No edema or pleural fluid. No pneumothorax. IMPRESSION: Resolution of bilateral infiltrates seen on the prior chest x-ray. Some residual scarring/ atelectasis present in the left lower lung. Electronically Signed   By: Aletta Edouard M.D.   On: 10/25/2015 08:38   Dg Knee Complete 4 Views Left  10/13/2015  CLINICAL DATA:  Left knee weakness and pain, initial encounter, no current injury EXAM: LEFT KNEE - COMPLETE 4+ VIEW COMPARISON:  10/07/2015 FINDINGS: Medullary rod is again noted in the left tibia. Healed proximal left fibular fracture is noted. No joint effusion is seen. No acute fracture or dislocation is noted. IMPRESSION: Changes consistent with prior trauma and surgical fixation. No acute abnormality noted.  Electronically Signed   By: Inez Catalina M.D.   On: 10/13/2015 15:26   Dg Knee Complete 4 Views Left  10/07/2015  CLINICAL DATA:  Was standing on a chair which gave way from under her, fell onto LEFT knee, anterior pain infrapatellar with bruising, prior tibial nailing due to previous injury. EXAM: LEFT KNEE - COMPLETE 4+ VIEW COMPARISON:  08/12/2015 FINDINGS: Diffuse osseous demineralization. IM nail with 2 proximal locking screws within visualized tibia. Minimal degenerative changes at LEFT knee joint with joint space narrowing. Old  healed oblique proximal fibular diaphyseal fracture. No acute fracture, dislocation, or bone destruction. No knee joint effusion. IMPRESSION: Prior LEFT tibial nailing. Osseous demineralization with minimal degenerative changes LEFT knee. Old healed proximal LEFT fibular diaphyseal fracture. No acute abnormalities. Electronically Signed   By: Lavonia Dana M.D.   On: 10/07/2015 14:56    ASSESSMENT: Thrombocytopenia.  PLAN:    1. Thrombocytopenia: Decreased, but stable.  Possibly multifactorial. Patient was previously was noted to have positive platelet antibodies, repeat testing today reveals they are now negative. ANA continues to be positive therefore patient will require a referral to rheumatology. Previous history, the remainder of her laboratory work was either negative or within normal limits. Ultrasound of the spleen did not reveal splenomegaly. No intervention is needed at this time. Patient will return to clinic in 3 months with repeat laboratory work and then in 6 months with repeat laboratory work and further evaluation.  Patient expressed understanding and was in agreement with this plan. She also understands that She can call clinic at any time with any questions, concerns, or complaints.    Mayra Reel, NP   11/05/2015 1:30 PM   Patient was seen and evaluated independently and I agree with the assessment and plan as dictated above.  Lloyd Huger, MD  11/08/2015 7:49 AM

## 2015-11-05 NOTE — Progress Notes (Signed)
Patient reports that she is needing hernia surgery but the local surgeon here referred her to [facility] surgery due to thrombocytopenia.

## 2015-11-06 LAB — PLATELET ANTIBODY PROFILE, SERUM
HLA Ab Ser Ql EIA: NEGATIVE
IA/IIA ANTIBODY: NEGATIVE
IB/IX Antibody: NEGATIVE
IIB/IIIA Antibody: NEGATIVE

## 2015-11-06 LAB — ENA+DNA/DS+SJORGEN'S
DS DNA AB: 42 [IU]/mL — AB (ref 0–9)
RIBONUCLEIC PROTEIN: 0.2 AI (ref 0.0–0.9)
SSA (Ro) (ENA) Antibody, IgG: <0.2 AI (ref 0.0–0.9)
SSB (La) (ENA) Antibody, IgG: <0.2 AI (ref 0.0–0.9)

## 2015-11-06 LAB — ANA W/REFLEX: ANA: POSITIVE — AB

## 2015-11-08 ENCOUNTER — Emergency Department: Payer: Medicaid Other

## 2015-11-08 ENCOUNTER — Emergency Department
Admission: EM | Admit: 2015-11-08 | Discharge: 2015-11-08 | Disposition: A | Discharge status: Home / Self Care | Payer: Medicaid Other | Attending: Student | Admitting: Student

## 2015-11-08 ENCOUNTER — Encounter: Payer: Self-pay | Admitting: Emergency Medicine

## 2015-11-08 DIAGNOSIS — G8929 Other chronic pain: Secondary | ICD-10-CM | POA: Insufficient documentation

## 2015-11-08 DIAGNOSIS — F17200 Nicotine dependence, unspecified, uncomplicated: Secondary | ICD-10-CM | POA: Insufficient documentation

## 2015-11-08 DIAGNOSIS — Z7951 Long term (current) use of inhaled steroids: Secondary | ICD-10-CM | POA: Insufficient documentation

## 2015-11-08 DIAGNOSIS — Z79899 Other long term (current) drug therapy: Secondary | ICD-10-CM | POA: Insufficient documentation

## 2015-11-08 DIAGNOSIS — R509 Fever, unspecified: Secondary | ICD-10-CM | POA: Diagnosis not present

## 2015-11-08 DIAGNOSIS — K469 Unspecified abdominal hernia without obstruction or gangrene: Secondary | ICD-10-CM | POA: Insufficient documentation

## 2015-11-08 DIAGNOSIS — K5900 Constipation, unspecified: Secondary | ICD-10-CM | POA: Diagnosis present

## 2015-11-08 DIAGNOSIS — R112 Nausea with vomiting, unspecified: Secondary | ICD-10-CM | POA: Diagnosis not present

## 2015-11-08 DIAGNOSIS — K5909 Other constipation: Secondary | ICD-10-CM

## 2015-11-08 LAB — COMPREHENSIVE METABOLIC PANEL
ALT: 38 U/L (ref 14–54)
ANION GAP: 6 (ref 5–15)
AST: 54 U/L — ABNORMAL HIGH (ref 15–41)
Albumin: 3.7 g/dL (ref 3.5–5.0)
Alkaline Phosphatase: 125 U/L (ref 38–126)
BUN: 12 mg/dL (ref 6–20)
CHLORIDE: 97 mmol/L — AB (ref 101–111)
CO2: 31 mmol/L (ref 22–32)
CREATININE: 0.93 mg/dL (ref 0.44–1.00)
Calcium: 8.3 mg/dL — ABNORMAL LOW (ref 8.9–10.3)
GFR calc non Af Amer: >60 mL/min (ref >60)
Glucose, Bld: 112 mg/dL — ABNORMAL HIGH (ref 65–99)
Potassium: 3.1 mmol/L — ABNORMAL LOW (ref 3.5–5.1)
SODIUM: 134 mmol/L — AB (ref 135–145)
Total Bilirubin: 1 mg/dL (ref 0.3–1.2)
Total Protein: 6.5 g/dL (ref 6.5–8.1)

## 2015-11-08 LAB — CBC
HCT: 35.4 % (ref 35.0–47.0)
HEMOGLOBIN: 12 g/dL (ref 12.0–16.0)
MCH: 30.9 pg (ref 26.0–34.0)
MCHC: 33.8 g/dL (ref 32.0–36.0)
MCV: 91.6 fL (ref 80.0–100.0)
Platelets: 102 10*3/uL — ABNORMAL LOW (ref 150–440)
RBC: 3.87 MIL/uL (ref 3.80–5.20)
RDW: 14.3 % (ref 11.5–14.5)
WBC: 8.5 10*3/uL (ref 3.6–11.0)

## 2015-11-08 LAB — LIPASE, BLOOD: LIPASE: 17 U/L (ref 11–51)

## 2015-11-08 MED ORDER — POLYETHYLENE GLYCOL 3350 17 GM/SCOOP PO POWD: ORAL | Status: DC
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
## 2015-11-08 NOTE — ED Notes (Signed)
Per patient's husband pt has not had a BM x 14 days. Pt's husband states that the fever started yesterday. Pt's husband states that he has been giving her enemas and laxatives without relief at this time. Pt's husband states that she has trouble sleeping due to a hernia, which he also attributes to her not being able to have a bowel movement. Pt's husband states that she has been "talking out of her head" due to lack of sleep. Pt is alert and oriented in triage at this time, NAD Noted.

## 2015-11-08 NOTE — ED Notes (Signed)
Pt left her husband in the room to sign her paperwork. Paperwork given to husband and he signed discharge

## 2015-11-10 ENCOUNTER — Other Ambulatory Visit: Payer: Self-pay | Admitting: *Deleted

## 2015-11-10 ENCOUNTER — Encounter: Payer: Self-pay | Admitting: *Deleted

## 2015-11-10 DIAGNOSIS — R768 Other specified abnormal immunological findings in serum: Secondary | ICD-10-CM

## 2015-11-10 NOTE — Progress Notes (Signed)
Faxed referral to rheumatology.

## 2015-11-17 ENCOUNTER — Encounter: Payer: Self-pay | Admitting: *Deleted

## 2015-11-26 ENCOUNTER — Encounter: Payer: Self-pay | Admitting: General Surgery

## 2015-11-26 ENCOUNTER — Ambulatory Visit (INDEPENDENT_AMBULATORY_CARE_PROVIDER_SITE_OTHER): Payer: Medicaid Other | Admitting: General Surgery

## 2015-11-26 VITALS — BP 160/90 | HR 70 | Resp 16 | Ht 63.0 in | Wt 200.0 lb

## 2015-11-26 DIAGNOSIS — K439 Ventral hernia without obstruction or gangrene: Secondary | ICD-10-CM | POA: Diagnosis not present

## 2015-11-26 NOTE — Progress Notes (Signed)
[name] ID: [name], female   DOB: 1971/07/17, 45 y.o.   MRN: [id number]  Chief Complaint  [name] presents with  . Hernia    HPI [name] is a 45 y.o. female.  Here today for evaluation of hernia as a second opinion. [name] has seen Dr. Adonis Huguenin. [name] states [name] has a knot upper right abdomen.  CT scan was at [facility] on 10-25-15. [name] did go to the ED on 11-08-15 sue to abdominal pain with constipation. [name] admits to chronic nausea, intermittent vomiting that has not increased from February. [name] feels [name] has chronic abdominal pain due to her hernia.  Her bowels move about every 3 days.   [name] noticed the hernia 10 years ago but it has gotten larger and more painful over the past 6 months.  [name] does go to the pain clinic.  [name] is here today with her husband.  I have reviewed the history of present illness with the [name].   HPI  Past Medical History  Diagnosis Date  . Bipolar 1 disorder ([place])   . Other abdominal hernia     disk disease  . PTSD (post-traumatic stress disorder)   . Leg fracture   . Asthma   . GERD (gastroesophageal reflux disease)   . Scoliosis   . Fibromyalgia   . Agoraphobia with panic disorder   . COPD (chronic obstructive pulmonary disease) ([place])   . Depression   . Hepatitis B     Past Surgical History  Procedure Laterality Date  . Other surgical history  07/2014    left tib fib repair  . Other surgical history  07/2014    right tib fib repair  . Fracture surgery    . Appendectomy  1999  . Cholecystectomy  1999  . Tubal ligation  1999    Family History  Problem Relation Age of Onset  . COPD Mother   . Heart disease Mother   . Parkinson's disease Father     Social History Social History  Substance Use Topics  . Smoking status: Current Every Day Smoker -- 0.50 packs/day for 12 years    Last Attempt to Quit: 10/12/2015  . Smokeless tobacco: Never Used  . Alcohol Use: No    Allergies  Allergen Reactions  . Sulfa  Antibiotics Shortness Of Breath  . Coconut Oil Swelling  . Nsaids Other (See Comments)    restless  . Other     Steroids, causes hallunications  . Tramadol Anxiety    Current Outpatient Prescriptions  Medication Sig Dispense Refill  . ADVAIR DISKUS 250-50 MCG/DOSE AEPB Inhale 1 puff into the lungs daily.   0  . ALPRAZolam (XANAX) 0.5 MG tablet Take 1 mg by mouth 3 (three) times daily as needed.     . AMITIZA 24 MCG capsule Take 24 mcg by mouth daily with breakfast.   0  . amphetamine-dextroamphetamine (ADDERALL) 20 MG tablet Take 40 mg by mouth every morning.  0  . benzonatate (TESSALON) 200 MG capsule Take 1 capsule by mouth 3 (three) times daily as needed. Reported on 11/05/2015  0  . cefdinir (OMNICEF) 300 MG capsule Take 1 capsule by mouth 2 (two) times daily. Reported on 11/05/2015  0  . cetirizine (ZYRTEC) 10 MG tablet Take 10 mg by mouth daily.   0  . cyclobenzaprine (FLEXERIL) 10 MG tablet Take 10 mg by mouth 3 (three) times daily as needed.   0  . EPINEPHrine (EPIPEN 2-PAK) 0.3 mg/0.3 mL IJ SOAJ  injection Inject 0.3 mg into the muscle as needed.     . furosemide (LASIX) 40 MG tablet Take 40 mg by mouth daily.   0  . gabapentin (NEURONTIN) 400 MG capsule Take 400 mg by mouth 3 (three) times daily.   0  . hydrOXYzine (VISTARIL) 50 MG capsule Take 1 capsule by mouth every 6 (six) hours as needed.   0  . ketoconazole (NIZORAL) 2 % cream Apply 1 application topically 2 (two) times daily.  0  . mometasone (NASONEX) 50 MCG/ACT nasal spray Place 2 sprays into the nose daily.  0  . NEXIUM 40 MG capsule Take 40 mg by mouth daily.  0  . Olopatadine HCl (PATADAY) 0.2 % SOLN Place 1 drop into both eyes daily.    . ondansetron (ZOFRAN) 8 MG tablet Take 8 mg by mouth every 8 (eight) hours as needed.   0  . polyethylene glycol powder (GLYCOLAX/MIRALAX) powder Dissolve one heaping tablespoon in 4-8 ounces of juice or water. Dink this mixture twice daily until having one solid bowel movement daily  (then only take it once a day). 255 g 0  . PROAIR HFA 108 (90 BASE) MCG/ACT inhaler Inhale 2 puffs into the lungs every 4 (four) hours as needed.   0  . QUEtiapine (SEROQUEL) 300 MG tablet Take 600 mg by mouth at bedtime.  0  . rOPINIRole (REQUIP) 0.5 MG tablet Take 1 tablet by mouth 1 day or 1 dose.  0  . SPIRIVA HANDIHALER 18 MCG inhalation capsule Place 2 puffs into inhaler and inhale.  0  . TIVORBEX 20 MG CAPS Take 1 capsule by mouth 2 (two) times daily.   0  . Vitamin D, Ergocalciferol, (DRISDOL) 50000 UNITS CAPS capsule Take 50,000 Units by mouth once a week.  0  . oxyCODONE-acetaminophen (ROXICET) 5-325 MG tablet Take 1-2 tablets by mouth every 4 (four) hours as needed for severe pain. ([name] not taking: Reported on 11/26/2015) 15 tablet 0   No current facility-administered medications for this visit.    Review of Systems Review of Systems  Constitutional: Negative.   Respiratory: Negative.   Cardiovascular: Negative.   Gastrointestinal: Positive for nausea, vomiting, abdominal pain and constipation.    Blood pressure 160/90, pulse 70, resp. rate 16, height 5\' 3"  (1.6 m), weight 200 lb (90.719 kg), last menstrual period 11/08/2015.  Physical Exam Physical Exam  Constitutional: [name] is oriented to person, place, and time. [name] appears well-developed and well-nourished.  HENT:  Mouth/Throat: Oropharynx is clear and moist.  Eyes: Conjunctivae are normal. No scleral icterus.  Neck: Neck supple.  Cardiovascular: Normal rate, regular rhythm and normal heart sounds.   Pulmonary/Chest: Effort normal and breath sounds normal.  Abdominal: Soft. Normal appearance and bowel sounds are normal. A hernia is present.  sizeable ventral hernia with extension toward right upper quadrant.  Lymphadenopathy:    [name] has no cervical adenopathy.  Neurological: [name] is alert and oriented to person, place, and time.  Skin: Skin is warm and dry.  Psychiatric: Her behavior is normal.    Data  Reviewed Progress notes and CT scan reviewed.  Assessment    Ventral hernia. CT shows a fat containing moderate size mid epigastric hernia with a 2-3cm fascial defect.  Pt reportedly has thrombocytopenia. At present I have no details on this.   Plan    Discuss with Dr. Grayland Ormond from hematology who has been following this pt.. Surgery pending. Liver specialist is in [facility]  Hernia precautions and incarceration  were discussed with the [name]. If they develop symptoms of an incarcerated hernia, they were encouraged to seek prompt medical attention.  I have recommended repair of the hernia using mesh on an outpatient basis in the near future. The risk of infection was reviewed. The role of prosthetic mesh to minimize the risk of recurrence was reviewed.      PCP:  Lorelee Market This information has been scribed by Karie Fetch [place].   Brynleigh Sequeira G 11/28/2015, 8:48 AM

## 2015-11-26 NOTE — Patient Instructions (Addendum)
The patient is aware to call back for any questions or concerns. Hernia, Adult A hernia is the bulging of an organ or tissue through a weak spot in the muscles of the abdomen (abdominal wall). Hernias develop most often near the navel or groin. There are many kinds of hernias. Common kinds include:  Femoral hernia. This kind of hernia develops under the groin in the upper thigh area.  Inguinal hernia. This kind of hernia develops in the groin or scrotum.  Umbilical hernia. This kind of hernia develops near the navel.  Hiatal hernia. This kind of hernia causes part of the stomach to be pushed up into the chest.  Incisional hernia. This kind of hernia bulges through a scar from an abdominal surgery. CAUSES This condition may be caused by:  Heavy lifting.  Coughing over a long period of time.  Straining to have a bowel movement.  An incision made during an abdominal surgery.  A birth defect (congenital defect).  Excess weight or obesity.  Smoking.  Poor nutrition.  Cystic fibrosis.  Excess fluid in the abdomen.  Undescended testicles. SYMPTOMS Symptoms of a hernia include:  A lump on the abdomen. This is the first sign of a hernia. The lump may become more obvious with standing, straining, or coughing. It may get bigger over time if it is not treated or if the condition causing it is not treated.  Pain. A hernia is usually painless, but it may become painful over time if treatment is delayed. The pain is usually dull and may get worse with standing or lifting heavy objects. Sometimes a hernia gets tightly squeezed in the weak spot (strangulated) or stuck there (incarcerated) and causes additional symptoms. These symptoms may include:  Vomiting.  Nausea.  Constipation.  Irritability. DIAGNOSIS A hernia may be diagnosed with:  A physical exam. During the exam your health care provider may ask you to cough or to make a specific movement, because a hernia is usually  more visible when you move.  Imaging tests. These can include:  X-rays.  Ultrasound.  CT scan. TREATMENT A hernia that is small and painless may not need to be treated. A hernia that is large or painful may be treated with surgery. Inguinal hernias may be treated with surgery to prevent incarceration or strangulation. Strangulated hernias are always treated with surgery, because lack of blood to the trapped organ or tissue can cause it to die. Surgery to treat a hernia involves pushing the bulge back into place and repairing the weak part of the abdomen. HOME CARE INSTRUCTIONS  Avoid straining.  Do not lift anything heavier than 10 lb (4.5 kg).  Lift with your leg muscles, not your back muscles. This helps avoid strain.  When coughing, try to cough gently.  Prevent constipation. Constipation leads to straining with bowel movements, which can make a hernia worse or cause a hernia repair to break down. You can prevent constipation by:  Eating a high-fiber diet that includes plenty of fruits and vegetables.  Drinking enough fluids to keep your urine clear or pale yellow. Aim to drink 6-8 glasses of water per day.  Using a stool softener as directed by your health care provider.  Lose weight, if you are overweight.  Do not use any tobacco products, including cigarettes, chewing tobacco, or electronic cigarettes. If you need help quitting, ask your health care provider.  Keep all follow-up visits as directed by your health care provider. This is important. Your health care provider may   need to monitor your condition. SEEK MEDICAL CARE IF:  You have swelling, redness, and pain in the affected area.  Your bowel habits change. SEEK IMMEDIATE MEDICAL CARE IF:  You have a fever.  You have abdominal pain that is getting worse.  You feel nauseous or you vomit.  You cannot push the hernia back in place by gently pressing on it while you are lying down.  The hernia:  Changes in  shape or size.  Is stuck outside the abdomen.  Becomes discolored.  Feels hard or tender.   This information is not intended to replace advice given to you by your health care provider. Make sure you discuss any questions you have with your health care provider.   Document Released: 09/19/2005 Document Revised: 10/10/2014 Document Reviewed: 07/30/2014 Elsevier Interactive Patient Education 2016 [employer] Inc[employer]  

## 2015-11-28 ENCOUNTER — Encounter: Payer: Self-pay | Admitting: General Surgery

## 2015-12-01 ENCOUNTER — Other Ambulatory Visit: Payer: Self-pay | Admitting: Student

## 2015-12-01 DIAGNOSIS — K7460 Unspecified cirrhosis of liver: Secondary | ICD-10-CM

## 2015-12-01 DIAGNOSIS — B181 Chronic viral hepatitis B without delta-agent: Secondary | ICD-10-CM

## 2015-12-02 ENCOUNTER — Ambulatory Visit: Payer: Medicaid Other | Admitting: General Surgery

## 2015-12-04 ENCOUNTER — Ambulatory Visit
Admission: RE | Admit: 2015-12-04 | Discharge: 2015-12-04 | Disposition: A | Discharge status: Home / Self Care | Payer: Medicaid Other | Source: Ambulatory Visit | Attending: Student | Admitting: Student

## 2015-12-04 ENCOUNTER — Inpatient Hospital Stay: Admission: RE | Admit: 2015-12-04 | Payer: Medicaid Other | Source: Ambulatory Visit

## 2015-12-04 DIAGNOSIS — Z9049 Acquired absence of other specified parts of digestive tract: Secondary | ICD-10-CM | POA: Insufficient documentation

## 2015-12-04 DIAGNOSIS — B181 Chronic viral hepatitis B without delta-agent: Secondary | ICD-10-CM | POA: Diagnosis present

## 2015-12-04 DIAGNOSIS — K7460 Unspecified cirrhosis of liver: Secondary | ICD-10-CM | POA: Diagnosis present

## 2015-12-08 ENCOUNTER — Encounter: Payer: Self-pay | Admitting: General Surgery

## 2015-12-08 ENCOUNTER — Ambulatory Visit (INDEPENDENT_AMBULATORY_CARE_PROVIDER_SITE_OTHER): Payer: Medicaid Other | Admitting: General Surgery

## 2015-12-08 VITALS — BP 128/74 | HR 86 | Resp 14 | Ht 63.0 in | Wt 213.0 lb

## 2015-12-08 DIAGNOSIS — K439 Ventral hernia without obstruction or gangrene: Secondary | ICD-10-CM | POA: Diagnosis not present

## 2015-12-08 NOTE — Progress Notes (Signed)
Patient ID: [name], female   DOB: 02/26/1971, 45 y.o.   MRN: [id number]  Chief Complaint  Patient presents with  . Hernia    HPI [name] is a 45 y.o. female.  Here today regarding hernia repair. She states her platelets are better. Dr. Junie Bame is provider prescribing psychiatric medications.  I have reviewed the history of present illness with the patient.  HPI  Past Medical History  Diagnosis Date  . Bipolar 1 disorder ([place])   . Other abdominal hernia     disk disease  . PTSD (post-traumatic stress disorder)   . Leg fracture   . Asthma   . GERD (gastroesophageal reflux disease)   . Scoliosis   . Fibromyalgia   . Agoraphobia with panic disorder   . COPD (chronic obstructive pulmonary disease) ([place])   . Depression   . Hepatitis B     Past Surgical History  Procedure Laterality Date  . Other surgical history  07/2014    left tib fib repair  . Other surgical history  07/2014    right tib fib repair  . Fracture surgery    . Appendectomy  1999  . Cholecystectomy  1999  . Tubal ligation  1999    Family History  Problem Relation Age of Onset  . COPD Mother   . Heart disease Mother   . Parkinson's disease Father     Social History Social History  Substance Use Topics  . Smoking status: Current Every Day Smoker -- 0.50 packs/day for 12 years    Last Attempt to Quit: 10/12/2015  . Smokeless tobacco: Never Used  . Alcohol Use: No    Allergies  Allergen Reactions  . Sulfa Antibiotics Shortness Of Breath  . Coconut Oil Swelling  . Nsaids Other (See Comments)    restless  . Other     Steroids, causes hallunications  . Tramadol Anxiety    Current Outpatient Prescriptions  Medication Sig Dispense Refill  . ADVAIR DISKUS 250-50 MCG/DOSE AEPB Inhale 1 puff into the lungs daily.   0  . ALPRAZolam (XANAX) 0.5 MG tablet Take 1 mg by mouth 3 (three) times daily as needed.     . AMITIZA 24 MCG capsule Take 24 mcg by mouth daily with  breakfast.   0  . amphetamine-dextroamphetamine (ADDERALL) 20 MG tablet Take 40 mg by mouth every morning.  0  . benzonatate (TESSALON) 200 MG capsule Take 1 capsule by mouth 3 (three) times daily as needed. Reported on 11/05/2015  0  . cefdinir (OMNICEF) 300 MG capsule Take 1 capsule by mouth 2 (two) times daily. Reported on 11/05/2015  0  . cetirizine (ZYRTEC) 10 MG tablet Take 10 mg by mouth daily.   0  . cyclobenzaprine (FLEXERIL) 10 MG tablet Take 10 mg by mouth 3 (three) times daily as needed.   0  . EPINEPHrine (EPIPEN 2-PAK) 0.3 mg/0.3 mL IJ SOAJ injection Inject 0.3 mg into the muscle as needed.     . furosemide (LASIX) 40 MG tablet Take 40 mg by mouth daily.   0  . gabapentin (NEURONTIN) 400 MG capsule Take 400 mg by mouth 3 (three) times daily.   0  . hydrOXYzine (VISTARIL) 50 MG capsule Take 1 capsule by mouth every 6 (six) hours as needed.   0  . ketoconazole (NIZORAL) 2 % cream Apply 1 application topically 2 (two) times daily.  0  . mometasone (NASONEX) 50 MCG/ACT nasal spray Place 2 sprays into the nose  daily.  0  . NEXIUM 40 MG capsule Take 40 mg by mouth daily.  0  . Olopatadine HCl (PATADAY) 0.2 % SOLN Place 1 drop into both eyes daily.    . ondansetron (ZOFRAN) 8 MG tablet Take 8 mg by mouth every 8 (eight) hours as needed.   0  . polyethylene glycol powder (GLYCOLAX/MIRALAX) powder Dissolve one heaping tablespoon in 4-8 ounces of juice or water. Dink this mixture twice daily until having one solid bowel movement daily (then only take it once a day). 255 g 0  . PROAIR HFA 108 (90 BASE) MCG/ACT inhaler Inhale 2 puffs into the lungs every 4 (four) hours as needed.   0  . QUEtiapine (SEROQUEL) 300 MG tablet Take 600 mg by mouth at bedtime.  0  . rOPINIRole (REQUIP) 0.5 MG tablet Take 1 tablet by mouth 1 day or 1 dose.  0  . SPIRIVA HANDIHALER 18 MCG inhalation capsule Place 2 puffs into inhaler and inhale.  0  . TIVORBEX 20 MG CAPS Take 1 capsule by mouth 2 (two) times daily.   0  .  Vitamin D, Ergocalciferol, (DRISDOL) 50000 UNITS CAPS capsule Take 50,000 Units by mouth once a week.  0   No current facility-administered medications for this visit.    Review of Systems Review of Systems  Constitutional: Negative.   Respiratory: Negative.   Cardiovascular: Negative.   Gastrointestinal: Positive for nausea and abdominal pain.    Blood pressure 128/74, pulse 86, resp. rate 14, height 5\' 3"  (1.6 m), weight 213 lb (96.616 kg), last menstrual period 11/08/2015.  Physical Exam Physical Exam  Constitutional: She is oriented to person, place, and time. She appears well-developed and well-nourished.  HENT:  Mouth/Throat: Oropharynx is clear and moist.  Eyes: No scleral icterus.  Neck: Neck supple.  Cardiovascular: Normal rate, regular rhythm and normal heart sounds.   Pulmonary/Chest: Effort normal and breath sounds normal.  Abdominal: Soft. Bowel sounds are normal. A hernia is present. Hernia confirmed positive in the ventral area.  Epigastric ventral hernia going towards RUQ. Basically unchanged from last evaluation.   Lymphadenopathy:    She has no cervical adenopathy.  Neurological: She is alert and oriented to person, place, and time.  Skin: Skin is warm and dry.  Psychiatric: Her behavior is normal.    Data Reviewed Notes reviewed.   Assessment    Ventral hernia. Symptomatic. Thrombocytopenia which appears stable. This was discussed with Dr. Grayland Ormond who has been following her. It is ok to proceed with surgery as long as the platelet count is adequate - this will be checked 2 days prior to surgery.   With regard to chronic narcotic use, pt advised that she will be prescribed pain med for 5-7 days period post surgery. Beyond that, she will have to obtain from pain management or PCP. Pt voiced understanding of all of this and is agreeable.   Plan    Hernia precautions and incarceration were discussed with the patient. If they develop symptoms of an  incarcerated hernia, they were encouraged to seek prompt medical attention.  I have recommended repair of the hernia using mesh on an outpatient basis in the near future. The risk of infection was reviewed. The role of prosthetic mesh to minimize the risk of recurrence was reviewed.    PCP:  Brunetta Genera This information has been scribed by Karie Fetch RNBC.    SANKAR,SEEPLAPUTHUR G 12/08/2015, 11:54 AM

## 2015-12-09 ENCOUNTER — Telehealth: Payer: Self-pay | Admitting: *Deleted

## 2015-12-09 NOTE — Telephone Encounter (Signed)
Patient's surgery has been scheduled for 12-17-15 at [facility].

## 2015-12-10 ENCOUNTER — Other Ambulatory Visit: Payer: Self-pay | Admitting: General Surgery

## 2015-12-10 DIAGNOSIS — K439 Ventral hernia without obstruction or gangrene: Secondary | ICD-10-CM

## 2015-12-11 ENCOUNTER — Other Ambulatory Visit: Payer: Medicaid Other

## 2015-12-11 ENCOUNTER — Encounter: Payer: Self-pay | Admitting: *Deleted

## 2015-12-11 NOTE — Patient Instructions (Addendum)
  Your procedure is scheduled on: 12-17-15 (THURSDAY) Report to [facility] To find out your arrival time please call [phone] between 1PM - 3PM on 12-16-15 [facility])  Remember: Instructions that are not followed completely may result in serious medical risk, up to and including death, or upon the discretion of your surgeon and anesthesiologist your surgery may need to be rescheduled.    _X___ 1. Do not eat food or drink liquids after midnight. No gum chewing or hard candies.     _X___ 2. No Alcohol for 24 hours before or after surgery.   ____ 3. Bring all medications with you on the day of surgery if instructed.    _X___ 4. Notify your doctor if there is any change in your medical condition     (cold, fever, infections).     Do not wear jewelry, make-up, hairpins, clips or nail polish.  Do not wear lotions, powders, or perfumes. You may wear deodorant.  Do not shave 48 hours prior to surgery. Men may shave face and neck.  Do not bring valuables to the hospital.    [facility] is not responsible for any belongings or valuables.               Contacts, dentures or bridgework may not be worn into surgery.  Leave your suitcase in the car. After surgery it may be brought to your room.  For patients admitted to the hospital, discharge time is determined by your treatment team.   Patients discharged the day of surgery will not be allowed to drive home.   Please read over the following fact sheets that you were given:     _X___ Take these medicines the morning of surgery with A SIP OF WATER:    1. GABAPENTIN  2. [place]  3. TAKE AN EXTRA NEXIUM ON Wednesday NIGHT (12-16-15) BEFORE BED  4. MAY TAKE XANAX AM OF SURGERY IF NEEDED  5.  6.  ____ Fleet Enema (as directed)   _X___ Use CHG Soap as directed  _X___ Use inhalers on the day of surgery-USE ADVAIR, [place]  ____ Stop metformin 2 days prior to  surgery    ____ Take 1/2 of usual insulin dose the night before surgery and none on the morning of surgery.   ____ Stop Coumadin/Plavix/aspirin-N/A  _X___ Stop Anti-inflammatories-STOP TIVORBEX NOW-TYLENOL OK TO TAKE   ____ Stop supplements until after surgery.    ____ Bring C-Pap to the hospital.

## 2015-12-14 ENCOUNTER — Encounter
Admission: RE | Admit: 2015-12-14 | Discharge: 2015-12-14 | Disposition: A | Discharge status: Home / Self Care | Payer: Medicaid Other | Source: Ambulatory Visit | Attending: General Surgery | Admitting: General Surgery

## 2015-12-14 ENCOUNTER — Telehealth: Payer: Self-pay | Admitting: *Deleted

## 2015-12-14 ENCOUNTER — Other Ambulatory Visit: Payer: Self-pay | Admitting: *Deleted

## 2015-12-14 DIAGNOSIS — Z01812 Encounter for preprocedural laboratory examination: Secondary | ICD-10-CM | POA: Diagnosis present

## 2015-12-14 LAB — CBC WITH DIFFERENTIAL/PLATELET
Basophils Absolute: 0 10*3/uL (ref 0–0.1)
Basophils Relative: 0 %
EOS ABS: 0.1 10*3/uL (ref 0–0.7)
Eosinophils Relative: 2 %
HCT: 36.7 % (ref 35.0–47.0)
HEMOGLOBIN: 12.5 g/dL (ref 12.0–16.0)
LYMPHS ABS: 1 10*3/uL (ref 1.0–3.6)
LYMPHS PCT: 24 %
MCH: 30.8 pg (ref 26.0–34.0)
MCHC: 33.9 g/dL (ref 32.0–36.0)
MCV: 90.9 fL (ref 80.0–100.0)
MONOS PCT: 7 %
Monocytes Absolute: 0.3 10*3/uL (ref 0.2–0.9)
NEUTROS PCT: 67 %
Neutro Abs: 2.7 10*3/uL (ref 1.4–6.5)
Platelets: 113 10*3/uL — ABNORMAL LOW (ref 150–440)
RBC: 4.04 MIL/uL (ref 3.80–5.20)
RDW: 15.5 % — ABNORMAL HIGH (ref 11.5–14.5)
WBC: 4 10*3/uL (ref 3.6–11.0)

## 2015-12-14 LAB — POTASSIUM: Potassium: 3.8 mmol/L (ref 3.5–5.1)

## 2015-12-14 LAB — SURGICAL PCR SCREEN
MRSA, PCR: POSITIVE — AB
Staphylococcus aureus: POSITIVE — AB

## 2015-12-14 NOTE — Progress Notes (Signed)
Per Nira Conn in the Pre-admission Testing Department, patient is positive for MRSA and Staph. Dr. Jamal Collin notified.   A prescription for the following has been called in to [facility] today: mupirocin (bactroban) 2% ointment, one application, nasally, 2 times daily for 5 days, 22 gram tube, no refills.   Patient and her husband are aware of prescription and she was told to pick up and begin today. Instructions were reviewed with patient's husband per her request today.

## 2015-12-14 NOTE — Telephone Encounter (Signed)
       Patient notified of lab findings and that a prescription has been called in for her to [facility] on [address]. She verbalizes understanding.

## 2015-12-14 NOTE — Telephone Encounter (Signed)
-----   Message from Christene Lye, MD sent at 12/14/2015  9:58 AM EDT ----- CBC/platelets are ok. K is low. Send Rx for KCL 28meq bid , # 10

## 2015-12-14 NOTE — Pre-Procedure Instructions (Signed)
FAXED OVER +MRSA/STAPH RESULTS TO MICHELE AT DR Catha Nottingham DID RECEIVE FAX AND WILL LET DR [place]

## 2015-12-14 NOTE — Telephone Encounter (Signed)
Patient's husband notified that potassium level this last time was ok and that Dr. Jamal Collin reviewed labs from one month ago which were low.   This patient will not need to be on a potassium supplement prior to surgery on Thursday, December 17, 2015. Patient's husband will get in touch with his wife and let her know. He verbalizes understanding.

## 2015-12-16 ENCOUNTER — Encounter: Payer: Self-pay | Admitting: *Deleted

## 2015-12-16 NOTE — Pre-Procedure Instructions (Signed)
Notes   [name] (MR# [id number])      Progress Notes Info    Author Note Status Last Update User Last Update Date/Time   Lloyd Huger, MD Signed Lloyd Huger, MD 05/01/2015 10:15 AM    Progress Notes    Expand All Collapse All    [place]  [phone] [phone]) [phone]  ID: Giorgina Gruenewald Schnebly OB: 10/13/1970 MR#: [id number]  Patient Care Team: Lorelee Market, MD as PCP - General (Family Medicine)  CHIEF COMPLAINT:  Chief Complaint  Patient presents with  . Follow-up    thrombocytopenia    INTERVAL HISTORY: Patient returns to clinic today for repeat laboratory work and further evaluation. She continues to have multiple medical complaints which are all chronic and unchanged in nature. She denies any easy bleeding or bruising. She has no neurologic complaints. She denies any fevers. She has a good appetite and denies weight loss. She denies any chest pain or shortness of breath. She denies any nausea, vomiting, constipation, or diarrhea. She has no urinary complaints. Patient offers no further specific complaints.  REVIEW OF SYSTEMS:  Review of Systems  Constitutional: Negative.  Endo/Heme/Allergies: Does not bruise/bleed easily.  Psychiatric/Behavioral: Negative.    As per HPI. Otherwise, a complete review of systems is negatve.  PAST MEDICAL HISTORY: Past Medical History  Diagnosis Date  . Bipolar 1 disorder   . Other abdominal hernia     disk disease  . PTSD (post-traumatic stress disorder)   . Leg fracture   . Asthma   . GERD (gastroesophageal reflux disease)   . Scoliosis   . Fibromyalgia   . Agoraphobia with panic disorder   . COPD (chronic obstructive pulmonary disease)   . Depression     PAST SURGICAL HISTORY: Past Surgical History  Procedure Laterality Date  . Other surgical history      left tib fib repair  .  Other surgical history      left tib fib repair, has boot on  . Fracture surgery    . Appendectomy    . Cholecystectomy    . Tubal ligation      FAMILY HISTORY: CVA, CAD, depression, bipolar.   ADVANCED DIRECTIVES:    HEALTH MAINTENANCE: History  Substance Use Topics  . Smoking status: Current Every Day Smoker -- 0.50 packs/day for 12 years  . Smokeless tobacco: Not on file  . Alcohol Use: No    Colonoscopy: PAP: Bone density: Lipid panel:  Allergies  Allergen Reactions  . Sulfa Antibiotics Shortness Of Breath  . Coconut Oil Swelling  . Other     Steroids, causes hallunications  . Tramadol Anxiety    Current Outpatient Prescriptions  Medication Sig Dispense Refill  . ALPRAZolam (XANAX) 0.5 MG tablet Take 2 mg by mouth 3 (three) times daily.    Marland Kitchen amphetamine-dextroamphetamine (ADDERALL) 10 MG tablet Take 10 mg by mouth daily.    . cetirizine (ZYRTEC) 10 MG tablet Take 10 mg by mouth daily.   0  . cyclobenzaprine (FLEXERIL) 10 MG tablet Take 10 mg by mouth every 12 (twelve) hours.  0  . doxycycline (VIBRAMYCIN) 50 MG capsule Take 2 capsules (100 mg total) by mouth 2 (two) times daily. 28 capsule 0  . EPINEPHrine (EPIPEN 2-PAK) 0.3 mg/0.3 mL IJ SOAJ injection Inject 0.3 mg into the muscle as needed.     . fluconazole (DIFLUCAN) 150 MG tablet Take 1 tablet (150 mg total) by mouth daily. 6 tablet 0  .  furosemide (LASIX) 40 MG tablet Take 40 mg by mouth daily.   0  . gabapentin (NEURONTIN) 400 MG capsule Take 400 mg by mouth 3 (three) times daily.   0  . hydrOXYzine (VISTARIL) 50 MG capsule Take 1 capsule by mouth 4 (four) times daily.  0  . ketoconazole (NIZORAL) 2 % cream Apply 1 application topically 2 (two) times daily.  0  . mometasone (NASONEX) 50 MCG/ACT nasal spray Place 2 sprays  into the nose daily.  0  . mometasone (NASONEX) 50 MCG/ACT nasal spray 2 sprays by Each Nare route daily.    . Olopatadine HCl (PATADAY) 0.2 % SOLN Place 1 drop into both eyes daily.    Marland Kitchen omeprazole (PRILOSEC) 40 MG capsule Take 40 mg by mouth daily.   0  . PREMARIN 0.625 MG tablet Take 0.625 mg by mouth daily.   0  . PROAIR HFA 108 (90 BASE) MCG/ACT inhaler Inhale 2 puffs into the lungs every 4 (four) hours as needed.   0  . QUEtiapine (SEROQUEL) 300 MG tablet Take 600 mg by mouth at bedtime.  0  . Vitamin D, Ergocalciferol, (DRISDOL) 50000 UNITS CAPS capsule Take 50,000 Units by mouth once a week.  0  . HYDROcodone-acetaminophen (NORCO) 5-325 MG per tablet Take 1-2 tablets by mouth every 4 (four) hours as needed for moderate pain. 15 tablet 0  . methocarbamol (ROBAXIN) 500 MG tablet Take 1 tablet (500 mg total) by mouth every 6 (six) hours as needed for muscle spasms. 30 tablet 0  . naproxen (EC NAPROSYN) 500 MG EC tablet Take 1 tablet (500 mg total) by mouth 2 (two) times daily with a meal. 60 tablet 0   No current facility-administered medications for this visit.    OBJECTIVE: Filed Vitals:   04/15/15 1636  BP: 117/87  Pulse: 86  Temp: 97.8 F (36.6 C)  Resp: 20   Body mass index is 32.65 kg/(m^2). ECOG FS:0 - Asymptomatic  General: Well-developed, well-nourished, no acute distress. Eyes: Pink conjunctiva, anicteric sclera. Lungs: Clear to auscultation bilaterally. Heart: Regular rate and rhythm. No rubs, murmurs, or gallops. Abdomen: Soft, nontender, nondistended. No organomegaly noted, normoactive bowel sounds. Musculoskeletal: No edema, cyanosis, or clubbing. Neuro: Alert, answering all questions appropriately. Cranial nerves grossly intact. Skin: No rashes or petechiae noted. Psych: Normal affect.  LAB RESULTS:   Recent Labs    Lab Results  Component Value Date   NA 140 03/26/2015   K  4.4 03/26/2015   CL 109 03/26/2015   CO2 26 03/26/2015   GLUCOSE 187* 03/26/2015   BUN 21* 03/26/2015   CREATININE 0.92 03/26/2015   CALCIUM 8.7* 03/26/2015   PROT 6.3* 03/25/2015   ALBUMIN 2.8* 03/25/2015   AST 75* 03/25/2015   ALT 60* 03/25/2015   ALKPHOS 163* 03/25/2015   BILITOT 0.8 03/25/2015   GFRNONAA >60 03/26/2015   GFRAA >60 03/26/2015       Recent Labs    Lab Results  Component Value Date   WBC 3.5* 04/15/2015   NEUTROABS 1.8 04/15/2015   HGB 13.0 04/15/2015   HCT 40.8 04/15/2015   MCV 94.7 04/15/2015   PLT 76* 04/15/2015       STUDIES:  Imaging Results    Dg Sacrum/coccyx  04/28/2015 CLINICAL DATA: Chronic low back pain. Pain has worsened over the past several days and now radiates into the left leg. No known injury. EXAM: SACRUM AND COCCYX - 2+ VIEW COMPARISON: CT abdomen and pelvis 11/26/2013. FINDINGS: There is no  evidence of fracture or other focal bone lesions. IMPRESSION: Negative exam. Electronically Signed By: Inge Rise M.D. On: 04/28/2015 16:09   Dg Tibia/fibula Left  04/01/2015 CLINICAL DATA: Increasing left lower extremity swelling EXAM: LEFT TIBIA AND FIBULA - 2 VIEW COMPARISON: 02/18/2015 FINDINGS: A medullary rod is noted in the tibia with proximal and distal fixation screws. Healing is noted at the fracture site. Some generalized soft tissue swelling with subcutaneous edema is noted. Proximal healing fibular fracture is noted as well. No acute bony abnormality is seen. IMPRESSION: Healing tibial fracture with evidence of fixation. No acute abnormality is noted. The lucency seen posteriorly on the prior exam demonstrates increased callus formation. Electronically Signed By: Inez Catalina M.D. On: 04/01/2015 22:06   US Venous Img Lower Unilateral Left  04/01/2015 CLINICAL DATA: Left leg pain and swelling EXAM: Left LOWER  EXTREMITY VENOUS DOPPLER ULTRASOUND TECHNIQUE: Gray-scale sonography with graded compression, as well as color Doppler and duplex ultrasound were performed to evaluate the lower extremity deep venous systems from the level of the common femoral vein and including the common femoral, femoral, profunda femoral, popliteal and calf veins including the posterior tibial, peroneal and gastrocnemius veins when visible. The superficial great saphenous vein was also interrogated. Spectral Doppler was utilized to evaluate flow at rest and with distal augmentation maneuvers in the common femoral, femoral and popliteal veins. COMPARISON: None. FINDINGS: Contralateral Common Femoral Vein: Respiratory phasicity is normal and symmetric with the symptomatic side. No evidence of thrombus. Normal compressibility. Common Femoral Vein: No evidence of thrombus. Normal compressibility, respiratory phasicity and response to augmentation. Saphenofemoral Junction: No evidence of thrombus. Normal compressibility and flow on color Doppler imaging. Profunda Femoral Vein: No evidence of thrombus. Normal compressibility and flow on color Doppler imaging. Femoral Vein: No evidence of thrombus. Normal compressibility, respiratory phasicity and response to augmentation. Popliteal Vein: No evidence of thrombus. Normal compressibility, respiratory phasicity and response to augmentation. Calf Veins: No evidence of thrombus. Normal compressibility and flow on color Doppler imaging. Superficial Great Saphenous Vein: No evidence of thrombus. Normal compressibility and flow on color Doppler imaging. Venous Reflux: None. Other Findings: None. IMPRESSION: No evidence of deep venous thrombosis. Electronically Signed By: Inez Catalina M.D. On: 04/01/2015 19:39     ASSESSMENT: Thrombocytopenia.  PLAN:   1. Thrombocytopenia: Decreased, but stable. Possibly multifactorial. Patient noted to have positive platelet antibodies as well  as a positive ANA. Will repeat both with next lab draw. Remainder of her laboratory work is either negative or within normal limits. Ultrasound of the spleen did not reveal splenomegaly. No intervention is needed at this time. Patient will return to clinic in 3 months with repeat laboratory work and then in 6 months with repeat laboratory work and further evaluation.  Patient expressed understanding and was in agreement with this plan. She also understands that She can call clinic at any time with any questions, concerns, or complaints.    Lloyd Huger, MD 05/01/2015 10:11 AM

## 2015-12-16 NOTE — Pre-Procedure Instructions (Signed)
CLINICAL DATA: Hepatitis B, status post cholecystectomy  EXAM: US ABDOMEN LIMITED - RIGHT UPPER QUADRANT  ULTRASOUND HEPATIC ELASTOGRAPHY  TECHNIQUE: Limited right upper quadrant abdominal ultrasound was performed. In addition, ultrasound elastography evaluation of the liver was performed. A region of interest was placed in the right lobe of the liver. Following application of a compressive sonographic pulse, shear waves were detected in the adjacent hepatic tissue and the shear wave velocity was calculated. Multiple assessments were performed at the selected site. Median shear wave velocity is correlated to a Metavir fibrosis score.  COMPARISON: None.  FINDINGS: ULTRASOUND ABDOMEN LIMITED RIGHT UPPER QUADRANT  Gallbladder: Surgically absent.  Common bile duct: Diameter: 6 mm  Liver: Coarse hepatic echotexture with nodular hepatic contour. No focal hepatic lesion is seen.  ULTRASOUND HEPATIC ELASTOGRAPHY  Device: Siemens Helix VQ  Patient position: Supine  Transducer: 6C1  Number of measurements: 10  Hepatic Segment: 8  Median velocity: 4.37 m/sec  IQR: 0.33  IQR/Median velocity ratio: 0.07  Corresponding Metavir fibrosis score: Some F3 + F4  Risk of fibrosis: High  Limitations of exam: Difficulty pausing respirations.  Pertinent findings noted on other imaging exams: None  Please note that abnormal shear wave velocities may also be identified in clinical settings other than with hepatic fibrosis, such as: acute hepatitis, elevated right heart and central venous pressures including use of beta blockers, veno-occlusive disease (Budd-Chiari), infiltrative processes such as mastocytosis/amyloidosis/infiltrative tumor, extrahepatic cholestasis, in the post-prandial state, and liver transplantation. Correlation with patient history, laboratory data, and clinical condition recommended.  IMPRESSION: Coarse hepatic  echotexture with nodular hepatic contour, reflecting cirrhosis. No focal hepatic lesion is seen.  Status post cholecystectomy.  Median hepatic shear wave velocity is calculated at 4.37 m/sec. Corresponding Metavir fibrosis score is Some F3 + F4. Risk of fibrosis is high.  Follow-up: Follow up advised.   Electronically Signed  By: Julian Hy M.D.  On: 12/04/2015 10:39           Vitals     Height Weight BMI (Calculated)    5\' 3"  (1.6 m) 213 lb (96.616 kg) 37.8      External Result Report     External Result Report     Imaging     Imaging Information     Signed by     Signed Date/Time   Phone Pager    Julian Hy 12/04/2015 10:39 AM [phone]       Exam Information     Status Exam Begun   Exam Ended      Final [99] 12/04/2015 9:03 AM 12/04/2015 10:21 AM        Signed     Electronically signed by Julian Hy, MD on 12/04/15 at 1039 EST     Original Order     Ordered On Ordered By      12/01/2015 9:15 AM Amanda Cockayne, Rad Tech             PACS Images     Show images for US Abdomen Limited RUQ    US Abdomen Limited RUQ (Order [id number])  Imaging  Order: [id number]   Released By: Elberta Leatherwood  Authorizing: Lavera Guise, PA-C   Date: 12/04/2015  Department: [facility]       Order Information     Order Date/Time Release Date/Time Start Date/Time End Date/Time    12/04/15 08:43 AM 12/04/15 08:43 AM 12/04/15 08:43 AM 12/04/2015      Order Details  Frequency Duration Priority Order Class    As needed 1 occurrence Routine Ancillary Performed      Order Questions     Question Answer Comment    Reason for Exam (SYMPTOM OR DIAGNOSIS REQUIRED) hep B     Note:  Appropriate reason for exam on pre-op X-rays should indicate the reason for the surgery. Examples include: Coronary Artery disease, osteoartiritis of the knee, brain  tumor, etc.    Preferred imaging location? [facility]       Order History  Inpatient    Date/Time Action Taken User Additional Information    0000 Result Rowan Blase In process    12/04/15 [id number] Release Elberta Leatherwood [id number]    12/04/15 1039 Result Rad Results In Interface Final      Verbal Order Info     Action Created on Order Mode Entered by Responsible Provider Signed by Signed on    Ordering 12/01/15 1451 Verbal with Read Back: Cosign Required Amanda Cockayne, Rad Tech [address], PA-C Lavera Guise, [place] 12/01/15 1554      Cosign Order Info     Action Created on Responsible Provider Signed by Signed on    Ordering 12/01/15 [place], PA-C Lavera Guise, [place] 12/01/15 1554      Imaging CC Recipients       Associated Diagnoses       ICD-9-CM ICD-10-CM    Chronic hepatitis B virus infection ([place])    070.32 B18.1    Cirrhosis of liver without ascites, unspecified hepatic cirrhosis type ([place])    571.5 K74.60      Collection Information     Resulting Agency: [facility]       Order Provider Info       Office phone Pager/beeper E-mail    Ordering User Amanda Cockayne, [place] [phone] -- --    Authorizing Provider Lavera Guise, [place] [phone] -- --    Billing Provider Julian Hy, MD [phone] -- --      Reprint Requisition     US Abdomen Limited RUQ (Order [id number]) on 12/04/15     Original Order     Ordered On Ordered By      12/01/2015 9:15 AM Amanda Cockayne, Rad Tech

## 2015-12-17 ENCOUNTER — Ambulatory Visit: Payer: Medicaid Other | Admitting: Anesthesiology

## 2015-12-17 ENCOUNTER — Encounter: Payer: Self-pay | Admitting: Anesthesiology

## 2015-12-17 ENCOUNTER — Ambulatory Visit
Admission: RE | Admit: 2015-12-17 | Discharge: 2015-12-17 | Disposition: A | Discharge status: Home / Self Care | Payer: Medicaid Other | Source: Ambulatory Visit | Attending: General Surgery | Admitting: General Surgery

## 2015-12-17 ENCOUNTER — Encounter
Admission: RE | Disposition: A | Discharge status: Home / Self Care | Payer: Self-pay | Source: Ambulatory Visit | Attending: General Surgery

## 2015-12-17 DIAGNOSIS — N189 Chronic kidney disease, unspecified: Secondary | ICD-10-CM | POA: Diagnosis not present

## 2015-12-17 DIAGNOSIS — F17210 Nicotine dependence, cigarettes, uncomplicated: Secondary | ICD-10-CM | POA: Diagnosis not present

## 2015-12-17 DIAGNOSIS — G709 Myoneural disorder, unspecified: Secondary | ICD-10-CM | POA: Diagnosis not present

## 2015-12-17 DIAGNOSIS — J45909 Unspecified asthma, uncomplicated: Secondary | ICD-10-CM | POA: Diagnosis not present

## 2015-12-17 DIAGNOSIS — Z882 Allergy status to sulfonamides status: Secondary | ICD-10-CM | POA: Insufficient documentation

## 2015-12-17 DIAGNOSIS — M419 Scoliosis, unspecified: Secondary | ICD-10-CM | POA: Insufficient documentation

## 2015-12-17 DIAGNOSIS — Z79899 Other long term (current) drug therapy: Secondary | ICD-10-CM | POA: Diagnosis not present

## 2015-12-17 DIAGNOSIS — F4001 Agoraphobia with panic disorder: Secondary | ICD-10-CM | POA: Insufficient documentation

## 2015-12-17 DIAGNOSIS — K7460 Unspecified cirrhosis of liver: Secondary | ICD-10-CM | POA: Diagnosis not present

## 2015-12-17 DIAGNOSIS — K219 Gastro-esophageal reflux disease without esophagitis: Secondary | ICD-10-CM | POA: Insufficient documentation

## 2015-12-17 DIAGNOSIS — K439 Ventral hernia without obstruction or gangrene: Secondary | ICD-10-CM

## 2015-12-17 DIAGNOSIS — E039 Hypothyroidism, unspecified: Secondary | ICD-10-CM | POA: Diagnosis not present

## 2015-12-17 DIAGNOSIS — F4310 Post-traumatic stress disorder, unspecified: Secondary | ICD-10-CM | POA: Diagnosis not present

## 2015-12-17 DIAGNOSIS — J449 Chronic obstructive pulmonary disease, unspecified: Secondary | ICD-10-CM | POA: Diagnosis not present

## 2015-12-17 DIAGNOSIS — Z886 Allergy status to analgesic agent status: Secondary | ICD-10-CM | POA: Insufficient documentation

## 2015-12-17 DIAGNOSIS — Z8614 Personal history of Methicillin resistant Staphylococcus aureus infection: Secondary | ICD-10-CM | POA: Diagnosis not present

## 2015-12-17 DIAGNOSIS — M797 Fibromyalgia: Secondary | ICD-10-CM | POA: Diagnosis not present

## 2015-12-17 DIAGNOSIS — B181 Chronic viral hepatitis B without delta-agent: Secondary | ICD-10-CM | POA: Diagnosis not present

## 2015-12-17 DIAGNOSIS — F319 Bipolar disorder, unspecified: Secondary | ICD-10-CM | POA: Diagnosis not present

## 2015-12-17 HISTORY — DX: Cardiac murmur, unspecified: R01.1

## 2015-12-17 HISTORY — DX: Unspecified cirrhosis of liver: K74.60

## 2015-12-17 HISTORY — PX: VENTRAL HERNIA REPAIR: SHX424

## 2015-12-17 HISTORY — DX: Hypothyroidism, unspecified: E03.9

## 2015-12-17 HISTORY — DX: Chronic kidney disease, unspecified: N18.9

## 2015-12-17 HISTORY — DX: Pneumonia, unspecified organism: J18.9

## 2015-12-17 HISTORY — DX: Personal history of Methicillin resistant Staphylococcus aureus infection: Z86.14

## 2015-12-17 LAB — URINE DRUG SCREEN, QUALITATIVE (ARMC ONLY)
Amphetamines, Ur Screen: NOT DETECTED
BARBITURATES, UR SCREEN: NOT DETECTED
Benzodiazepine, Ur Scrn: NOT DETECTED
CANNABINOID 50 NG, UR ~~LOC~~: POSITIVE — AB
COCAINE METABOLITE, UR ~~LOC~~: NOT DETECTED
MDMA (ECSTASY) UR SCREEN: NOT DETECTED
Methadone Scn, Ur: NOT DETECTED
OPIATE, UR SCREEN: POSITIVE — AB
PHENCYCLIDINE (PCP) UR S: NOT DETECTED
TRICYCLIC, UR SCREEN: POSITIVE — AB

## 2015-12-17 SURGERY — REPAIR, HERNIA, VENTRAL
Anesthesia: General | Wound class: Clean

## 2015-12-17 MED ORDER — BUPIVACAINE HCL (PF) 0.5 % IJ SOLN
INTRAMUSCULAR | Status: DC | PRN
  Administered 2015-12-17: 20 mL

## 2015-12-17 MED ORDER — IPRATROPIUM-ALBUTEROL 0.5-2.5 (3) MG/3ML IN SOLN
3.0000 mL | Freq: Once | RESPIRATORY_TRACT | Status: AC
  Administered 2015-12-17: 3 mL via RESPIRATORY_TRACT

## 2015-12-17 MED ORDER — FENTANYL CITRATE (PF) 100 MCG/2ML IJ SOLN
INTRAMUSCULAR | Status: AC
  Administered 2015-12-17: 50 ug via INTRAVENOUS
  Filled 2015-12-17: qty 2

## 2015-12-17 MED ORDER — LIDOCAINE HCL (CARDIAC) 20 MG/ML IV SOLN
INTRAVENOUS | Status: DC | PRN
  Administered 2015-12-17: 50 mg via INTRAVENOUS

## 2015-12-17 MED ORDER — OXYCODONE HCL 5 MG PO TABS
ORAL_TABLET | ORAL | Status: AC
  Filled 2015-12-17: qty 1

## 2015-12-17 MED ORDER — CHLORHEXIDINE GLUCONATE 4 % EX LIQD: 1.0000 "application " | Freq: Once | CUTANEOUS | Status: DC

## 2015-12-17 MED ORDER — HYDROMORPHONE HCL 1 MG/ML IJ SOLN
0.2500 mg | INTRAMUSCULAR | Status: DC | PRN
  Administered 2015-12-17 (×2): 0.5 mg via INTRAVENOUS

## 2015-12-17 MED ORDER — KETAMINE HCL 50 MG/ML IJ SOLN
INTRAMUSCULAR | Status: DC | PRN
  Administered 2015-12-17: 25 mg via INTRAMUSCULAR
  Administered 2015-12-17: 25 mg via INTRAVENOUS

## 2015-12-17 MED ORDER — FENTANYL CITRATE (PF) 100 MCG/2ML IJ SOLN
INTRAMUSCULAR | Status: AC
  Filled 2015-12-17: qty 2

## 2015-12-17 MED ORDER — FENTANYL CITRATE (PF) 100 MCG/2ML IJ SOLN
INTRAMUSCULAR | Status: DC | PRN
  Administered 2015-12-17: 100 ug via INTRAVENOUS

## 2015-12-17 MED ORDER — VANCOMYCIN HCL IN DEXTROSE 1-5 GM/200ML-% IV SOLN
1000.0000 mg | Freq: Once | INTRAVENOUS | Status: AC
Start: 2015-12-17 — End: 2015-12-17
  Administered 2015-12-17: 1000 mg via INTRAVENOUS

## 2015-12-17 MED ORDER — DEXMEDETOMIDINE HCL IN NACL 200 MCG/50ML IV SOLN
INTRAVENOUS | Status: DC | PRN
  Administered 2015-12-17: 8 ug via INTRAVENOUS

## 2015-12-17 MED ORDER — ONDANSETRON HCL 4 MG/2ML IJ SOLN
INTRAMUSCULAR | Status: DC | PRN
  Administered 2015-12-17: 4 mg via INTRAVENOUS

## 2015-12-17 MED ORDER — VANCOMYCIN HCL IN DEXTROSE 1-5 GM/200ML-% IV SOLN
INTRAVENOUS | Status: AC
  Filled 2015-12-17: qty 200

## 2015-12-17 MED ORDER — SUCCINYLCHOLINE CHLORIDE 20 MG/ML IJ SOLN
INTRAMUSCULAR | Status: DC | PRN
  Administered 2015-12-17: 100 mg via INTRAVENOUS

## 2015-12-17 MED ORDER — FENTANYL CITRATE (PF) 100 MCG/2ML IJ SOLN
25.0000 ug | INTRAMUSCULAR | Status: DC | PRN
  Administered 2015-12-17 (×3): 50 ug via INTRAVENOUS

## 2015-12-17 MED ORDER — LIDOCAINE HCL (PF) 1 % IJ SOLN
INTRAMUSCULAR | Status: AC
  Filled 2015-12-17: qty 30

## 2015-12-17 MED ORDER — ACETAMINOPHEN 10 MG/ML IV SOLN
INTRAVENOUS | Status: AC
  Filled 2015-12-17: qty 100

## 2015-12-17 MED ORDER — LACTATED RINGERS IV SOLN
INTRAVENOUS | Status: DC
  Administered 2015-12-17: 09:00:00 via INTRAVENOUS

## 2015-12-17 MED ORDER — OXYCODONE HCL 5 MG PO TABS: 5.0000 mg | ORAL_TABLET | Freq: Once | ORAL | Status: DC | PRN

## 2015-12-17 MED ORDER — BUPIVACAINE HCL (PF) 0.5 % IJ SOLN
INTRAMUSCULAR | Status: AC
  Filled 2015-12-17: qty 30

## 2015-12-17 MED ORDER — OXYCODONE HCL 5 MG/5ML PO SOLN: 5.0000 mg | Freq: Once | ORAL | Status: DC | PRN

## 2015-12-17 MED ORDER — ROCURONIUM BROMIDE 100 MG/10ML IV SOLN
INTRAVENOUS | Status: DC | PRN
  Administered 2015-12-17 (×2): 10 mg via INTRAVENOUS
  Administered 2015-12-17: 20 mg via INTRAVENOUS

## 2015-12-17 MED ORDER — PROPOFOL 10 MG/ML IV BOLUS
INTRAVENOUS | Status: DC | PRN
  Administered 2015-12-17: 200 mg via INTRAVENOUS

## 2015-12-17 MED ORDER — HYDROMORPHONE HCL 1 MG/ML IJ SOLN
INTRAMUSCULAR | Status: AC
  Administered 2015-12-17: 0.5 mg via INTRAVENOUS
  Filled 2015-12-17: qty 1

## 2015-12-17 MED ORDER — MIDAZOLAM HCL 2 MG/2ML IJ SOLN
INTRAMUSCULAR | Status: DC | PRN
  Administered 2015-12-17: 2 mg via INTRAVENOUS

## 2015-12-17 MED ORDER — SUGAMMADEX SODIUM 200 MG/2ML IV SOLN
INTRAVENOUS | Status: DC | PRN
  Administered 2015-12-17: 200 mg via INTRAVENOUS

## 2015-12-17 MED ORDER — OXYCODONE-ACETAMINOPHEN 5-325 MG PO TABS: 1.0000 | ORAL_TABLET | Freq: Four times a day (QID) | ORAL | Status: DC | PRN

## 2015-12-17 MED ORDER — IPRATROPIUM-ALBUTEROL 0.5-2.5 (3) MG/3ML IN SOLN
RESPIRATORY_TRACT | Status: AC
  Filled 2015-12-17: qty 3

## 2015-12-17 MED ORDER — GLYCOPYRROLATE 0.2 MG/ML IJ SOLN
INTRAMUSCULAR | Status: DC | PRN
  Administered 2015-12-17: 0.2 mg via INTRAVENOUS

## 2015-12-17 SURGICAL SUPPLY — 34 items
BLADE SURG 15 STRL SS SAFETY (BLADE) ×3 IMPLANT
CANISTER SUCT 1200ML W/VALVE (MISCELLANEOUS) ×3 IMPLANT
CHLORAPREP W/TINT 26ML (MISCELLANEOUS) ×3 IMPLANT
CLOSURE WOUND 1/2 X4 (GAUZE/BANDAGES/DRESSINGS) ×1
DECANTER SPIKE VIAL GLASS SM (MISCELLANEOUS) ×6 IMPLANT
DRAPE LAPAROTOMY 100X77 ABD (DRAPES) ×3 IMPLANT
DRESSING TELFA 4X3 1S ST N-ADH (GAUZE/BANDAGES/DRESSINGS) ×3 IMPLANT
DRSG TEGADERM 4X4.75 (GAUZE/BANDAGES/DRESSINGS) ×3 IMPLANT
ELECT REM PT RETURN 9FT ADLT (ELECTROSURGICAL) ×3
ELECTRODE REM PT RTRN 9FT ADLT (ELECTROSURGICAL) ×1 IMPLANT
GLOVE BIO SURGEON STRL SZ7 (GLOVE) ×3 IMPLANT
GOWN STRL REUS W/ TWL LRG LVL3 (GOWN DISPOSABLE) ×2 IMPLANT
GOWN STRL REUS W/TWL LRG LVL3 (GOWN DISPOSABLE) ×4
KIT RM TURNOVER STRD PROC AR (KITS) ×3 IMPLANT
LABEL OR SOLS (LABEL) ×3 IMPLANT
MESH VENTRALEX ST 8CM LRG (Mesh General) ×3 IMPLANT
NDL HPO THNWL 1X22GA REG BVL (NEEDLE) ×1 IMPLANT
NEEDLE HYPO 25X1 1.5 SAFETY (NEEDLE) ×6 IMPLANT
NEEDLE SAFETY 22GX1 (NEEDLE) ×2
NS IRRIG 500ML POUR BTL (IV SOLUTION) ×3 IMPLANT
PACK BASIN MINOR ARMC (MISCELLANEOUS) ×3 IMPLANT
SPONGE LAP 18X18 5 PK (GAUZE/BANDAGES/DRESSINGS) ×3 IMPLANT
STRIP CLOSURE SKIN 1/2X4 (GAUZE/BANDAGES/DRESSINGS) ×2 IMPLANT
SUT PROLENE 0 CT 1 30 (SUTURE) ×9 IMPLANT
SUT PROLENE 0 CT 2 (SUTURE) IMPLANT
SUT VIC AB 2-0 CT1 27 (SUTURE) ×2
SUT VIC AB 2-0 CT1 36 (SUTURE) ×3 IMPLANT
SUT VIC AB 2-0 CT1 TAPERPNT 27 (SUTURE) ×1 IMPLANT
SUT VIC AB 3-0 SH 27 (SUTURE) ×2
SUT VIC AB 3-0 SH 27X BRD (SUTURE) ×1 IMPLANT
SUT VIC AB 4-0 FS2 27 (SUTURE) ×3 IMPLANT
SUT VICRYL+ 3-0 144IN (SUTURE) ×3 IMPLANT
SWABSTK COMLB BENZOIN TINCTURE (MISCELLANEOUS) ×3 IMPLANT
SYR CONTROL 10ML (SYRINGE) ×3 IMPLANT

## 2015-12-17 NOTE — Anesthesia Preprocedure Evaluation (Signed)
Anesthesia Evaluation  Patient identified by MRN, date of birth, ID band Patient awake    Reviewed: Allergy & Precautions, H&P , NPO status , Patient's Chart, lab work & pertinent test results  History of Anesthesia Complications Negative for: history of anesthetic complications  Airway Mallampati: III  TM Distance: >3 FB Neck ROM: limited    Dental  (+) Poor Dentition, Chipped, Missing   Pulmonary neg shortness of breath, asthma , pneumonia, resolved, COPD, Current Smoker,    Pulmonary exam normal breath sounds clear to auscultation       Cardiovascular Exercise Tolerance: Good (-) angina(-) Past MI negative cardio ROS Normal cardiovascular exam Rhythm:regular Rate:Normal     Neuro/Psych PSYCHIATRIC DISORDERS Anxiety Depression Bipolar Disorder  Neuromuscular disease    GI/Hepatic GERD  Controlled,(+) Cirrhosis       , Hepatitis -, B  Endo/Other  Hypothyroidism   Renal/GU Renal disease  negative genitourinary   Musculoskeletal  (+) Arthritis , Fibromyalgia -  Abdominal   Peds  Hematology negative hematology ROS (+)   Anesthesia Other Findings Past Medical History:   Bipolar 1 disorder ([place])                                     Other abdominal hernia                                         Comment:disk disease   PTSD (post-traumatic stress disorder)                        Leg fracture                                                 Asthma                                                       GERD (gastroesophageal reflux disease)                       Scoliosis                                                    Fibromyalgia                                                 Agoraphobia with panic disorder                              COPD (chronic obstructive pulmonary disease) (*              Depression  Pneumonia                                       2016          Heart murmur                                                 Hypothyroidism                                               Cirrhosis of liver (H[facility])                                       Comment:recently diagnosed (12-2015)   Chronic kidney disease                                         Comment:h/o kidney stones   Hx MRSA infection                                              Comment:LAST HAD 2015   Hepatitis B                                                    Comment:recently dx per pt (12-14-15) but Epic states               chronic Hep B  Past Surgical History:   OTHER SURGICAL HISTORY                           07/2014        Comment:left tib fib repair   OTHER SURGICAL HISTORY                           07/2014        Comment:right tib fib repair   [place]  Comment:leg  BMI    Body Mass Index   37.74 kg/m 2      Reproductive/Obstetrics negative OB ROS                             Anesthesia Physical Anesthesia Plan  ASA: III  Anesthesia Plan: General ETT   Post-op Pain Management:    Induction:   Airway Management Planned:   Additional Equipment:   Intra-op Plan:   Post-operative Plan:   Informed Consent: I have reviewed the patients History and Physical, chart, labs and discussed the procedure including the risks, benefits and alternatives for the proposed anesthesia with the patient or authorized representative who has indicated his/her understanding and acceptance.   Dental Advisory Given  Plan Discussed with: Anesthesiologist, CRNA and Surgeon  Anesthesia Plan Comments:         Anesthesia Quick Evaluation

## 2015-12-17 NOTE — Progress Notes (Signed)
Duoneb given as ordered by Dr. Jeanice Lim, he is also aware re UDS results, will proceed with case.  Jamie in [place] room called re when to start Vancomycin, advises she will can when time to start it.

## 2015-12-17 NOTE — Transfer of Care (Signed)
Immediate Anesthesia Transfer of Care Note  Patient: [name]  Procedure(s) Performed: Procedure(s): HERNIA REPAIR VENTRAL ADULT (N/A)  Patient Location: PACU  Anesthesia Type:General  Level of Consciousness: awake and alert   Airway & Oxygen Therapy: Patient Spontanous Breathing and Patient connected to face mask oxygen  Post-op Assessment: Report given to RN  Post vital signs: Reviewed  Last Vitals:  Filed Vitals:   12/17/15 0859 12/17/15 1228  BP: 112/73 157/74  Pulse: 81 76  Temp: 36.8 C 36.3 C  Resp: 16 18    Complications: No apparent anesthesia complications

## 2015-12-17 NOTE — Interval H&P Note (Signed)
History and Physical Interval Note:  12/17/2015 10:33 AM  [name]  has presented today for surgery, with the diagnosis of VENTRAL HERNIA  The various methods of treatment have been discussed with the patient and family. After consideration of risks, benefits and other options for treatment, the patient has consented to  Procedure(s): HERNIA REPAIR VENTRAL ADULT (N/A) as a surgical intervention .  The patient's history has been reviewed, patient examined, no change in status, stable for surgery.  I have reviewed the patient's chart and labs.  Questions were answered to the patient's satisfaction.     SANKAR,SEEPLAPUTHUR G

## 2015-12-17 NOTE — Op Note (Signed)
Preop diagnosis: Ventral hernia  Post op diagnosis: Same  Operation: Repair of ventral hernia with mesh  Surgeon: S.G.Sankar  Assistant:     Anesthesia: Gen.  Complications: None  EBL: Less than 20 mL  Drains: None  Description: This patient was put to sleep with an endotracheal tube. The abdomen was prepped and draped as sterile field and timeout was performed. Patient had a fascial defect a little over 2 cm size in the midepigastric region with the hernia protrusion consisting primarily fat extending along to the right upper quadrant. A vertical midline incision approximately 8 cm was made. The incision was deepened through the subcutaneous tissue through the level of the fascia and the hernial protrusion was identified in the deep subcutaneous plane. The hernia was then freed by finger dissection going towards the right upper quadrant. The hernia protrusion consisted predominantly of preperitoneal fat and a small peritoneal sac with no other contents. The hernia measured approximately 10 cm in total length. This was not easily reducible. Accordingly the sac was opened and closed the fascial edge the large amount of preperitoneal fat and part of the sac was excised out. Peritoneal opening was closed with a running 2-0 Vicryl stitch and this was then easily pushed back in through the fascial defect. The fascial defect measured 2.2 cm in diameter and was transversely oriented. The fascial edges were lifted up and the preperitoneal space was dissected off in all directions to allow for placement of a circular mesh in the preperitoneal space. An 8 cm ventral ex ST patch was brought to the field and was positioned in the preperitoneal space with the central straps sorry pulled up through the fascial defect. Therefore of transfixing sutures of 0 Prolene were placed in the upper lower and the 2 lateral edges of the mesh incorporated into the fascia and tied down. The fascial defect was then closed with  3 interrupted figure-of-eight stitches of 0 proline incorporating the central strap and the axis of the strap was trimmed. Repair was adequate and the wound was irrigated. 20 mL of 4% Marcaine was instilled into the skin and subcutaneous tissue area for postop analgesia. The deeper layers were closed with the running sutures of 2-0 Vicryl and 3-0 Vicryl. Skin was approximated with a running subcuticular stitch of 3-0 Monocryl reinforced with liqui  Ban. Patient subsequently was extubated and returned recovery room stable condition

## 2015-12-17 NOTE — Discharge Instructions (Signed)

## 2015-12-17 NOTE — Anesthesia Procedure Notes (Signed)
Procedure Name: Intubation Performed by: Harmony Sandell Pre-anesthesia Checklist: Patient identified, Patient being monitored, Timeout performed, Emergency Drugs available and Suction available Patient Re-evaluated:Patient Re-evaluated prior to inductionOxygen Delivery Method: Circle system utilized Preoxygenation: Pre-oxygenation with 100% oxygen Intubation Type: IV induction and Rapid sequence Ventilation: Mask ventilation without difficulty Laryngoscope Size: Miller and 2 Grade View: Grade I Tube type: Oral Tube size: 7.0 mm Number of attempts: 1 Airway Equipment and Method: Stylet Placement Confirmation: ETT inserted through vocal cords under direct vision,  positive ETCO2 and breath sounds checked- equal and bilateral Secured at: 21 cm Tube secured with: Tape Dental Injury: Teeth and Oropharynx as per pre-operative assessment      

## 2015-12-17 NOTE — Progress Notes (Signed)
Ok to start Vancomycin per OR via [facility] 1003 pm - started via pump 1007 am @250cc /hr - OR aware it is a one hour infusion

## 2015-12-17 NOTE — H&P (View-Only) (Signed)
Patient ID: [name], female   DOB: 08-01-1971, 45 y.o.   MRN: [id number]  Chief Complaint  Patient presents with  . Hernia    HPI [name] is a 45 y.o. female.  Here today regarding hernia repair. She states her platelets are better. Dr. Junie Bame is provider prescribing psychiatric medications.  I have reviewed the history of present illness with the patient.  HPI  Past Medical History  Diagnosis Date  . Bipolar 1 disorder ([place])   . Other abdominal hernia     disk disease  . PTSD (post-traumatic stress disorder)   . Leg fracture   . Asthma   . GERD (gastroesophageal reflux disease)   . Scoliosis   . Fibromyalgia   . Agoraphobia with panic disorder   . COPD (chronic obstructive pulmonary disease) ([place])   . Depression   . Hepatitis B     Past Surgical History  Procedure Laterality Date  . Other surgical history  07/2014    left tib fib repair  . Other surgical history  07/2014    right tib fib repair  . Fracture surgery    . Appendectomy  1999  . Cholecystectomy  1999  . Tubal ligation  1999    Family History  Problem Relation Age of Onset  . COPD Mother   . Heart disease Mother   . Parkinson's disease Father     Social History Social History  Substance Use Topics  . Smoking status: Current Every Day Smoker -- 0.50 packs/day for 12 years    Last Attempt to Quit: 10/12/2015  . Smokeless tobacco: Never Used  . Alcohol Use: No    Allergies  Allergen Reactions  . Sulfa Antibiotics Shortness Of Breath  . Coconut Oil Swelling  . Nsaids Other (See Comments)    restless  . Other     Steroids, causes hallunications  . Tramadol Anxiety    Current Outpatient Prescriptions  Medication Sig Dispense Refill  . ADVAIR DISKUS 250-50 MCG/DOSE AEPB Inhale 1 puff into the lungs daily.   0  . ALPRAZolam (XANAX) 0.5 MG tablet Take 1 mg by mouth 3 (three) times daily as needed.     . AMITIZA 24 MCG capsule Take 24 mcg by mouth daily with  breakfast.   0  . amphetamine-dextroamphetamine (ADDERALL) 20 MG tablet Take 40 mg by mouth every morning.  0  . benzonatate (TESSALON) 200 MG capsule Take 1 capsule by mouth 3 (three) times daily as needed. Reported on 11/05/2015  0  . cefdinir (OMNICEF) 300 MG capsule Take 1 capsule by mouth 2 (two) times daily. Reported on 11/05/2015  0  . cetirizine (ZYRTEC) 10 MG tablet Take 10 mg by mouth daily.   0  . cyclobenzaprine (FLEXERIL) 10 MG tablet Take 10 mg by mouth 3 (three) times daily as needed.   0  . EPINEPHrine (EPIPEN 2-PAK) 0.3 mg/0.3 mL IJ SOAJ injection Inject 0.3 mg into the muscle as needed.     . furosemide (LASIX) 40 MG tablet Take 40 mg by mouth daily.   0  . gabapentin (NEURONTIN) 400 MG capsule Take 400 mg by mouth 3 (three) times daily.   0  . hydrOXYzine (VISTARIL) 50 MG capsule Take 1 capsule by mouth every 6 (six) hours as needed.   0  . ketoconazole (NIZORAL) 2 % cream Apply 1 application topically 2 (two) times daily.  0  . mometasone (NASONEX) 50 MCG/ACT nasal spray Place 2 sprays into the nose  daily.  0  . NEXIUM 40 MG capsule Take 40 mg by mouth daily.  0  . Olopatadine HCl (PATADAY) 0.2 % SOLN Place 1 drop into both eyes daily.    . ondansetron (ZOFRAN) 8 MG tablet Take 8 mg by mouth every 8 (eight) hours as needed.   0  . polyethylene glycol powder (GLYCOLAX/MIRALAX) powder Dissolve one heaping tablespoon in 4-8 ounces of juice or water. Dink this mixture twice daily until having one solid bowel movement daily (then only take it once a day). 255 g 0  . PROAIR HFA 108 (90 BASE) MCG/ACT inhaler Inhale 2 puffs into the lungs every 4 (four) hours as needed.   0  . QUEtiapine (SEROQUEL) 300 MG tablet Take 600 mg by mouth at bedtime.  0  . rOPINIRole (REQUIP) 0.5 MG tablet Take 1 tablet by mouth 1 day or 1 dose.  0  . SPIRIVA HANDIHALER 18 MCG inhalation capsule Place 2 puffs into inhaler and inhale.  0  . TIVORBEX 20 MG CAPS Take 1 capsule by mouth 2 (two) times daily.   0  .  Vitamin D, Ergocalciferol, (DRISDOL) 50000 UNITS CAPS capsule Take 50,000 Units by mouth once a week.  0   No current facility-administered medications for this visit.    Review of Systems Review of Systems  Constitutional: Negative.   Respiratory: Negative.   Cardiovascular: Negative.   Gastrointestinal: Positive for nausea and abdominal pain.    Blood pressure 128/74, pulse 86, resp. rate 14, height 5\' 3"  (1.6 m), weight 213 lb (96.616 kg), last menstrual period 11/08/2015.  Physical Exam Physical Exam  Constitutional: She is oriented to person, place, and time. She appears well-developed and well-nourished.  HENT:  Mouth/Throat: Oropharynx is clear and moist.  Eyes: No scleral icterus.  Neck: Neck supple.  Cardiovascular: Normal rate, regular rhythm and normal heart sounds.   Pulmonary/Chest: Effort normal and breath sounds normal.  Abdominal: Soft. Bowel sounds are normal. A hernia is present. Hernia confirmed positive in the ventral area.  Epigastric ventral hernia going towards RUQ. Basically unchanged from last evaluation.   Lymphadenopathy:    She has no cervical adenopathy.  Neurological: She is alert and oriented to person, place, and time.  Skin: Skin is warm and dry.  Psychiatric: Her behavior is normal.    Data Reviewed Notes reviewed.   Assessment    Ventral hernia. Symptomatic. Thrombocytopenia which appears stable. This was discussed with Dr. Grayland Ormond who has been following her. It is ok to proceed with surgery as long as the platelet count is adequate - this will be checked 2 days prior to surgery.   With regard to chronic narcotic use, pt advised that she will be prescribed pain med for 5-7 days period post surgery. Beyond that, she will have to obtain from pain management or PCP. Pt voiced understanding of all of this and is agreeable.   Plan    Hernia precautions and incarceration were discussed with the patient. If they develop symptoms of an  incarcerated hernia, they were encouraged to seek prompt medical attention.  I have recommended repair of the hernia using mesh on an outpatient basis in the near future. The risk of infection was reviewed. The role of prosthetic mesh to minimize the risk of recurrence was reviewed.    PCP:  Brunetta Genera This information has been scribed by Karie Fetch RNBC.    Bram Hottel G 12/08/2015, 11:54 AM

## 2015-12-17 NOTE — Anesthesia Postprocedure Evaluation (Signed)
Anesthesia Post Note  Patient: [name]  Procedure(s) Performed: Procedure(s) (LRB): HERNIA REPAIR VENTRAL ADULT (N/A)  Patient location during evaluation: PACU Anesthesia Type: General Level of consciousness: awake and alert Pain management: pain level controlled Vital Signs Assessment: post-procedure vital signs reviewed and stable Respiratory status: spontaneous breathing, nonlabored ventilation, respiratory function stable and patient connected to nasal cannula oxygen Cardiovascular status: blood pressure returned to baseline and stable Postop Assessment: no signs of nausea or vomiting Anesthetic complications: no    Last Vitals:  Filed Vitals:   12/17/15 1318 12/17/15 1335  BP: 114/82 120/81  Pulse: 73 73  Temp: 36.7 C 36.8 C  Resp: 24 18    Last Pain:  Filed Vitals:   12/17/15 1344  PainSc: 3                  Broadus John K Cerrone Debold

## 2015-12-21 ENCOUNTER — Emergency Department
Admission: EM | Admit: 2015-12-21 | Discharge: 2015-12-21 | Disposition: A | Discharge status: Home / Self Care | Payer: Medicaid Other | Attending: Emergency Medicine | Admitting: Emergency Medicine

## 2015-12-21 ENCOUNTER — Encounter: Payer: Self-pay | Admitting: Emergency Medicine

## 2015-12-21 DIAGNOSIS — T391X5A Adverse effect of 4-Aminophenol derivatives, initial encounter: Secondary | ICD-10-CM | POA: Insufficient documentation

## 2015-12-21 DIAGNOSIS — Z79899 Other long term (current) drug therapy: Secondary | ICD-10-CM | POA: Insufficient documentation

## 2015-12-21 DIAGNOSIS — Z7951 Long term (current) use of inhaled steroids: Secondary | ICD-10-CM | POA: Diagnosis not present

## 2015-12-21 DIAGNOSIS — T402X5A Adverse effect of other opioids, initial encounter: Secondary | ICD-10-CM | POA: Diagnosis not present

## 2015-12-21 DIAGNOSIS — R109 Unspecified abdominal pain: Secondary | ICD-10-CM | POA: Insufficient documentation

## 2015-12-21 DIAGNOSIS — G8918 Other acute postprocedural pain: Secondary | ICD-10-CM | POA: Insufficient documentation

## 2015-12-21 DIAGNOSIS — T50905A Adverse effect of unspecified drugs, medicaments and biological substances, initial encounter: Secondary | ICD-10-CM

## 2015-12-21 DIAGNOSIS — F17200 Nicotine dependence, unspecified, uncomplicated: Secondary | ICD-10-CM | POA: Insufficient documentation

## 2015-12-21 DIAGNOSIS — R112 Nausea with vomiting, unspecified: Secondary | ICD-10-CM | POA: Insufficient documentation

## 2015-12-21 DIAGNOSIS — Z9889 Other specified postprocedural states: Secondary | ICD-10-CM | POA: Diagnosis not present

## 2015-12-21 MED ORDER — OXYCODONE HCL 5 MG PO TABS: 5.0000 mg | ORAL_TABLET | Freq: Three times a day (TID) | ORAL | Status: DC | PRN

## 2015-12-21 NOTE — ED Notes (Signed)
Patient states having abdominal pain since had surgery her for hernia. Midline suture line noted from upper abdomen to umbilicus. Was discharged from same day procedure 4 days ago. No fevers. States only here for pain control.

## 2015-12-21 NOTE — ED Notes (Signed)
Patient states she has cirrhosis and the pain medicine she was prescribed as part of her post-op pain mgt is causing her to have pain that is different from her incisional pain.  She is not getting any pain relief.

## 2015-12-21 NOTE — ED Provider Notes (Signed)
CSN: [id number]     Arrival date & time 12/21/15  1828 History   First MD Initiated Contact with Patient 12/21/15 2028     Chief Complaint  Patient presents with  . Incisional Pain     (Consider location/radiation/quality/duration/timing/severity/associated sxs/prior Treatment) HPI  45 year old female presents to the emergency department for evaluation of abdominal pain. Patient is 4 days out from abdominal hernia repair performed by Dr. Jamal Collin. Patient has been doing well overall. Her pain is moderate and controlled with Percocet 5-325 one tab by mouth every 6 hours, but unfortunately she states she has cirrhosis and is unable to tolerate the Tylenol. Tylenol also causes her nausea/vomiting. She has been unable to get in touch with her surgeon or PCP to rewrite a prescription. Patient denies any fevers, increased abdominal pain, chest pain or shortness of breath.  Past Medical History  Diagnosis Date  . Bipolar 1 disorder ([place])   . Other abdominal hernia     disk disease  . PTSD (post-traumatic stress disorder)   . Leg fracture   . Asthma   . GERD (gastroesophageal reflux disease)   . Scoliosis   . Fibromyalgia   . Agoraphobia with panic disorder   . COPD (chronic obstructive pulmonary disease) ([place])   . Depression   . Pneumonia 2016  . Heart murmur   . Hypothyroidism   . Cirrhosis of liver ([place])     recently diagnosed (12-2015)  . Chronic kidney disease     h/o kidney stones  . Hx MRSA infection     LAST HAD 2015  . Hepatitis B     recently dx per pt (12-14-15) but Epic states chronic Hep B   Past Surgical History  Procedure Laterality Date  . Other surgical history  07/2014    left tib fib repair  . Other surgical history  07/2014    right tib fib repair  . Appendectomy  1999  . Cholecystectomy  1999  . Tubal ligation  1999  . Fracture surgery      leg  . Ventral hernia repair N/A 12/17/2015    Procedure: HERNIA REPAIR VENTRAL ADULT;  Surgeon: Christene Lye,  MD;  Location: [facility] ORS;  Service: General;  Laterality: N/A;   Family History  Problem Relation Age of Onset  . COPD Mother   . Heart disease Mother   . Parkinson's disease Father    Social History  Substance Use Topics  . Smoking status: Current Every Day Smoker -- 0.50 packs/day for 12 years    Last Attempt to Quit: 10/12/2015  . Smokeless tobacco: Never Used  . Alcohol Use: No   OB History    Gravida Para Term Preterm AB TAB SAB Ectopic Multiple Living   4    1  1          Obstetric Comments   1st Menstrual Cycle:  15  1st Pregnancy:  18      Review of Systems  Constitutional: Negative for fever, chills, activity change and fatigue.  HENT: Negative for congestion, sinus pressure and sore throat.   Eyes: Negative for visual disturbance.  Respiratory: Negative for cough, chest tightness and shortness of breath.   Cardiovascular: Negative for chest pain and leg swelling.  Gastrointestinal: Positive for abdominal pain (Postoperative pain gradually improving.). Negative for nausea, vomiting and diarrhea.  Genitourinary: Negative for dysuria.  Musculoskeletal: Negative for arthralgias and gait problem.  Skin: Negative for rash.  Neurological: Negative for weakness, numbness and headaches.  Hematological:  Negative for adenopathy.  Psychiatric/Behavioral: Negative for behavioral problems, confusion and agitation.      Allergies  Sulfa antibiotics; Coconut oil; Nsaids; Other; and Tramadol  Home Medications   Prior to Admission medications   Medication Sig Start Date End Date Taking? Authorizing Provider  ADVAIR DISKUS 250-50 MCG/DOSE AEPB Inhale 1 puff into the lungs every morning.  09/18/15   Historical Provider, MD  ALPRAZolam Duanne Moron) 0.5 MG tablet Take 1 mg by mouth 3 (three) times daily as needed.  03/24/14   Historical Provider, MD  AMITIZA 24 MCG capsule Take 24 mcg by mouth daily with breakfast.  09/21/15   Historical Provider, MD  amphetamine-dextroamphetamine  (ADDERALL) 20 MG tablet Take 40 mg by mouth every morning. 09/21/15   Historical Provider, MD  cetirizine (ZYRTEC) 10 MG tablet Take 10 mg by mouth daily.  03/12/15   Historical Provider, MD  cyclobenzaprine (FLEXERIL) 10 MG tablet Take 10 mg by mouth 3 (three) times daily as needed.  03/16/15   Historical Provider, MD  EPINEPHrine (EPIPEN 2-PAK) 0.3 mg/0.3 mL IJ SOAJ injection Inject 0.3 mg into the muscle as needed.  11/19/14   Historical Provider, MD  furosemide (LASIX) 40 MG tablet Take 40 mg by mouth daily.  03/12/15   Historical Provider, MD  gabapentin (NEURONTIN) 400 MG capsule Take 400 mg by mouth 2 (two) times daily.  03/12/15   Historical Provider, MD  hydrOXYzine (VISTARIL) 50 MG capsule Take 1 capsule by mouth every 6 (six) hours as needed.  03/12/15   Historical Provider, MD  ketoconazole (NIZORAL) 2 % cream Apply 1 application topically 2 (two) times daily. 03/12/15   Historical Provider, MD  mometasone (NASONEX) 50 MCG/ACT nasal spray Place 2 sprays into the nose daily. 03/12/15   Historical Provider, MD  NEXIUM 40 MG capsule Take 40 mg by mouth every morning.  09/15/15   Historical Provider, MD  Olopatadine HCl (PATADAY) 0.2 % SOLN Place 1 drop into both eyes daily. 10/15/14   Historical Provider, MD  ondansetron (ZOFRAN) 8 MG tablet Take 8 mg by mouth every 8 (eight) hours as needed.  10/06/15   Historical Provider, MD  oxyCODONE (ROXICODONE) 5 MG immediate release tablet Take 1 tablet (5 mg total) by mouth every 8 (eight) hours as needed. 12/21/15 12/20/16  Duanne Guess, PA-C  oxyCODONE-acetaminophen (ROXICET) 5-325 MG tablet Take 1 tablet by mouth every 6 (six) hours as needed. 12/17/15   Seeplaputhur Robinette Haines, MD  polyethylene glycol powder (GLYCOLAX/MIRALAX) powder Dissolve one heaping tablespoon in 4-8 ounces of juice or water. Dink this mixture twice daily until having one solid bowel movement daily (then only take it once a day). 11/08/15   Joanne Gavel, MD  PROAIR HFA 108 (90 BASE) MCG/ACT  inhaler Inhale 2 puffs into the lungs every 4 (four) hours as needed.  03/12/15   Historical Provider, MD  QUEtiapine (SEROQUEL) 300 MG tablet Take 600 mg by mouth at bedtime. 04/09/15   Historical Provider, MD  rOPINIRole (REQUIP) 0.5 MG tablet Take 1 tablet by mouth at bedtime.  09/17/15   Historical Provider, MD  SPIRIVA HANDIHALER 18 MCG inhalation capsule Place 2 puffs into inhaler and inhale every morning.  09/18/15   Historical Provider, MD  TIVORBEX 20 MG CAPS Take 1 capsule by mouth 2 (two) times daily.  10/06/15   Historical Provider, MD  Vitamin D, Ergocalciferol, (DRISDOL) 50000 UNITS CAPS capsule Take 50,000 Units by mouth once a week. 03/12/15   Historical Provider, MD  BP 113/73 mmHg  Pulse 94  Temp(Src) 98.3 F (36.8 C) (Oral)  Ht 5\' 3"  (1.6 m)  Wt 97.977 kg  BMI 38.27 kg/m2  SpO2 99%  LMP 12/15/2015 Physical Exam  Constitutional: She is oriented to person, place, and time. She appears well-developed and well-nourished. No distress.  HENT:  Head: Normocephalic and atraumatic.  Mouth/Throat: Oropharynx is clear and moist.  Eyes: EOM are normal. Pupils are equal, round, and reactive to light. Right eye exhibits no discharge. Left eye exhibits no discharge.  Neck: Normal range of motion. Neck supple.  Cardiovascular: Normal rate, regular rhythm and intact distal pulses.   Pulmonary/Chest: Effort normal and breath sounds normal. No respiratory distress. She exhibits no tenderness.  Abdominal: Soft. She exhibits no distension and no mass. There is no tenderness. There is no rebound and no guarding.  Patient has a linear incision along the midline of the abdomen just above the umbilicus. Dermabond is intact. There is mild ecchymosis with no warmth or erythema. Patient spent was tender to palpation. No distention.  Musculoskeletal: Normal range of motion. She exhibits no edema.  Neurological: She is alert and oriented to person, place, and time. She has normal reflexes.  Skin: Skin is  warm and dry.  Psychiatric: She has a normal mood and affect. Her behavior is normal. Thought content normal.    ED Course  Procedures (including critical care time) Labs Review Labs Reviewed - No data to display  Imaging Review No results found. I have personally reviewed and evaluated these images and lab results as part of my medical decision-making.   EKG Interpretation None      MDM   Final diagnoses:  Adverse drug reaction, initial encounter  Patient 4 days status post abdominal hernia repair. Continues to have pain, states she cannot tolerate oxycodone with tylenol due to cirrhosis and nausea/vomiting. Patient given 1 time rx for oxycodone 5 mg, 1 tab po q 8 hrs #6 with 0 refills. Call surgeon or PCP to discuss further pain management.    Duanne Guess, PA-C 12/21/15 2051  Delman Kitten, MD 12/22/15 0040

## 2015-12-21 NOTE — Discharge Instructions (Signed)
Drug Allergy °Allergic reactions to medicines are common. Some allergic reactions are mild. A delayed type of drug allergy that occurs 1 week or more after exposure to a medicine or vaccine is called serum sickness. A life-threatening, sudden (acute) allergic reaction that involves the whole body is called anaphylaxis. °CAUSES  °"True" drug allergies occur when there is an allergic reaction to a medicine. This is caused by overactivity of the immune system. First, the body becomes sensitized. The immune system is triggered by your first exposure to the medicine. Following this first exposure, future exposure to the same medicine may be life-threatening. °Almost any medicine can cause an allergic reaction. Common ones are: °· Penicillin. °· Sulfonamides (sulfa drugs). °· Local anesthetics. °· X-ray dyes that contain iodine. °SYMPTOMS  °Common symptoms of a minor allergic reaction are: °· Swelling around the mouth. °· An itchy red rash or hives. °· Vomiting or diarrhea. °Anaphylaxis can cause swelling of the mouth and throat. This makes it difficult to breathe and swallow. Severe reactions can be fatal within seconds, even after exposure to only a trace amount of the drug that causes the reaction. °HOME CARE INSTRUCTIONS °· If you are unsure of what caused your reaction, write down: °¨ The names of the medicines you took. °¨ How much medicine you took. °¨ How you took the medicine, such as whether you took a pill, injected the medicine, or applied it to your skin. °¨ All of the things you ate and drank. °¨ The date and time of your reaction. °¨ The symptoms of the reaction. °· You may want to follow up with an allergy specialist after the reaction has cleared in order to be tested to confirm the allergy. It is important to confirm that your reaction is an allergy, not just a side effect to the medicine. If you have a true allergy to a medicine, this may prevent that medicine and related medicines from being given to  you when you are very ill. °· If you have hives or a rash: °¨ Take medicines as directed by your caregiver. °¨ You may use an over-the-counter antihistamine (diphenhydramine) as needed. °¨ Apply cold compresses to the skin or take baths in cool water. Avoid hot baths or showers. °· If you are severely allergic: °¨ Continuous observation after a severe reaction may be needed. Hospitalization is often required. °¨ Wear a medical alert bracelet or necklace stating your allergy. °¨ You and your family must learn how to use an anaphylaxis kit or give an epinephrine injection to temporarily treat an emergency allergic reaction. If you have had a severe reaction, always carry your epinephrine injection or anaphylaxis kit with you. This can be lifesaving if you have a severe reaction. °· Do not drive or perform tasks after treatment until the medicines used to treat your reaction have worn off, or until your caregiver says it is okay. °· If you have a drug allergy that was confirmed by your health care provider: °¨ Carry information about the drug allergy with you at all times. °¨ Always check with a pharmacist before taking any over-the-counter medicine. °SEEK MEDICAL CARE IF:  °· You think you had an allergic reaction. Symptoms usually start within 30 minutes after exposure. °· Symptoms are getting worse rather than better. °· You develop new symptoms. °· The symptoms that brought you to your caregiver return. °SEEK IMMEDIATE MEDICAL CARE IF:  °· You have swelling of the mouth, difficulty breathing, or wheezing. °· You have a tight   feeling in your chest or throat.  You develop hives, swelling, or itching all over your body.  You develop severe vomiting or diarrhea.  You feel faint or pass out. This is an emergency. Use your epinephrine injection or anaphylaxis kit as you have been instructed. Call for emergency medical help. Even if you improve after the injection, you need to be examined at a hospital emergency  department. MAKE SURE YOU:   Understand these instructions.  Will watch your condition.  Will get help right away if you are not doing well or get worse.   This information is not intended to replace advice given to you by your health care provider. Make sure you discuss any questions you have with your health care provider.   Document Released: 09/19/2005 Document Revised: 10/10/2014 Document Reviewed: 04/21/2015 Elsevier Interactive Patient Education [employer].

## 2015-12-23 ENCOUNTER — Ambulatory Visit (INDEPENDENT_AMBULATORY_CARE_PROVIDER_SITE_OTHER): Payer: Medicaid Other | Admitting: General Surgery

## 2015-12-23 ENCOUNTER — Encounter: Payer: Self-pay | Admitting: General Surgery

## 2015-12-23 VITALS — BP 110/78 | HR 80 | Resp 14 | Ht 62.0 in | Wt 209.0 lb

## 2015-12-23 DIAGNOSIS — K439 Ventral hernia without obstruction or gangrene: Secondary | ICD-10-CM

## 2015-12-23 NOTE — Patient Instructions (Signed)
Follow up in 1 month   

## 2015-12-23 NOTE — Progress Notes (Signed)
This is a 45 year old female here today for her post op ventral hernia repair done on 12/17/15. Patient states she is still very sore. Bowels move daily. No nausea or vomiting. Pt is using a wheelchair today saying it hurts to move. She went to the emergency room yesterday and was prescribed oxycodone 5mg , 6 tablets.  I have reviewed the history of present illness with the patient.  Incision is clean and healing well. Abdominal is soft. Repair is intact. No signs of infection or seroma. No hematoma. Minimal ecchymois. Recovery is as expected at this stage. Her pain level is out of proportion from what would be expected and is likely from chronic use of narcotics. As discussed with the patient preoperatively, she needs to return to her pain management source for any continued use of narcotic pain medication. In the interim she was prescribed 10 tablets of oxycodone 5mg  q6h prn. This was discussed fully with the patient.   Patient to return in one month. Increase activity slowly as tolerated.         PCP:  Brunetta Genera This information has been scribed by Gaspar Cola CMA.

## 2015-12-28 ENCOUNTER — Emergency Department
Admission: EM | Admit: 2015-12-28 | Discharge: 2015-12-28 | Disposition: A | Discharge status: Home / Self Care | Payer: Medicaid Other | Attending: Emergency Medicine | Admitting: Emergency Medicine

## 2015-12-28 ENCOUNTER — Encounter: Payer: Self-pay | Admitting: Emergency Medicine

## 2015-12-28 DIAGNOSIS — J9600 Acute respiratory failure, unspecified whether with hypoxia or hypercapnia: Secondary | ICD-10-CM | POA: Insufficient documentation

## 2015-12-28 DIAGNOSIS — N189 Chronic kidney disease, unspecified: Secondary | ICD-10-CM | POA: Insufficient documentation

## 2015-12-28 DIAGNOSIS — E039 Hypothyroidism, unspecified: Secondary | ICD-10-CM | POA: Insufficient documentation

## 2015-12-28 DIAGNOSIS — D696 Thrombocytopenia, unspecified: Secondary | ICD-10-CM | POA: Insufficient documentation

## 2015-12-28 DIAGNOSIS — G894 Chronic pain syndrome: Secondary | ICD-10-CM | POA: Diagnosis not present

## 2015-12-28 DIAGNOSIS — F1320 Sedative, hypnotic or anxiolytic dependence, uncomplicated: Secondary | ICD-10-CM | POA: Diagnosis not present

## 2015-12-28 DIAGNOSIS — R011 Cardiac murmur, unspecified: Secondary | ICD-10-CM | POA: Diagnosis not present

## 2015-12-28 DIAGNOSIS — K7460 Unspecified cirrhosis of liver: Secondary | ICD-10-CM | POA: Diagnosis not present

## 2015-12-28 DIAGNOSIS — J189 Pneumonia, unspecified organism: Secondary | ICD-10-CM | POA: Insufficient documentation

## 2015-12-28 DIAGNOSIS — F909 Attention-deficit hyperactivity disorder, unspecified type: Secondary | ICD-10-CM | POA: Diagnosis not present

## 2015-12-28 DIAGNOSIS — J449 Chronic obstructive pulmonary disease, unspecified: Secondary | ICD-10-CM | POA: Diagnosis not present

## 2015-12-28 DIAGNOSIS — G8918 Other acute postprocedural pain: Secondary | ICD-10-CM | POA: Insufficient documentation

## 2015-12-28 DIAGNOSIS — F17210 Nicotine dependence, cigarettes, uncomplicated: Secondary | ICD-10-CM | POA: Insufficient documentation

## 2015-12-28 DIAGNOSIS — F1110 Opioid abuse, uncomplicated: Secondary | ICD-10-CM | POA: Insufficient documentation

## 2015-12-28 DIAGNOSIS — R109 Unspecified abdominal pain: Secondary | ICD-10-CM | POA: Diagnosis present

## 2015-12-28 DIAGNOSIS — Z79899 Other long term (current) drug therapy: Secondary | ICD-10-CM | POA: Insufficient documentation

## 2015-12-28 DIAGNOSIS — M419 Scoliosis, unspecified: Secondary | ICD-10-CM | POA: Diagnosis not present

## 2015-12-28 DIAGNOSIS — M797 Fibromyalgia: Secondary | ICD-10-CM | POA: Insufficient documentation

## 2015-12-28 DIAGNOSIS — F315 Bipolar disorder, current episode depressed, severe, with psychotic features: Secondary | ICD-10-CM | POA: Insufficient documentation

## 2015-12-28 DIAGNOSIS — J45909 Unspecified asthma, uncomplicated: Secondary | ICD-10-CM | POA: Diagnosis not present

## 2015-12-28 DIAGNOSIS — F4310 Post-traumatic stress disorder, unspecified: Secondary | ICD-10-CM | POA: Insufficient documentation

## 2015-12-28 DIAGNOSIS — M5136 Other intervertebral disc degeneration, lumbar region: Secondary | ICD-10-CM | POA: Diagnosis not present

## 2015-12-28 LAB — COMPREHENSIVE METABOLIC PANEL
ALBUMIN: 4.2 g/dL (ref 3.5–5.0)
ALT: 36 U/L (ref 14–54)
ANION GAP: 7 (ref 5–15)
AST: 45 U/L — AB (ref 15–41)
Alkaline Phosphatase: 169 U/L — ABNORMAL HIGH (ref 38–126)
BUN: 11 mg/dL (ref 6–20)
CHLORIDE: 103 mmol/L (ref 101–111)
CO2: 26 mmol/L (ref 22–32)
Calcium: 9.5 mg/dL (ref 8.9–10.3)
Creatinine, Ser: 0.74 mg/dL (ref 0.44–1.00)
GFR calc Af Amer: >60 mL/min (ref >60)
GFR calc non Af Amer: >60 mL/min (ref >60)
GLUCOSE: 118 mg/dL — AB (ref 65–99)
POTASSIUM: 3.2 mmol/L — AB (ref 3.5–5.1)
SODIUM: 136 mmol/L (ref 135–145)
Total Bilirubin: 0.7 mg/dL (ref 0.3–1.2)
Total Protein: 7.7 g/dL (ref 6.5–8.1)

## 2015-12-28 LAB — LIPASE, BLOOD: LIPASE: 16 U/L (ref 11–51)

## 2015-12-28 LAB — CBC
HEMATOCRIT: 39.8 % (ref 35.0–47.0)
HEMOGLOBIN: 13.5 g/dL (ref 12.0–16.0)
MCH: 30.4 pg (ref 26.0–34.0)
MCHC: 34 g/dL (ref 32.0–36.0)
MCV: 89.5 fL (ref 80.0–100.0)
Platelets: 126 10*3/uL — ABNORMAL LOW (ref 150–440)
RBC: 4.45 MIL/uL (ref 3.80–5.20)
RDW: 15.4 % — ABNORMAL HIGH (ref 11.5–14.5)
WBC: 5.8 10*3/uL (ref 3.6–11.0)

## 2015-12-28 MED ORDER — OXYCODONE HCL 5 MG PO TABS
5.0000 mg | ORAL_TABLET | Freq: Once | ORAL | Status: AC
  Administered 2015-12-28: 5 mg via ORAL

## 2015-12-28 MED ORDER — OXYCODONE HCL 5 MG PO TABS
ORAL_TABLET | ORAL | Status: AC
  Filled 2015-12-28: qty 1

## 2015-12-28 NOTE — Discharge Instructions (Signed)
Pain Relief Preoperatively and Postoperatively  If you have questions, problems, or concerns about the pain that you may feel after surgery, let your health care provider know. Patients have the right to assessment and management of pain. Severe pain after surgery--and the fear or anxiety associated with that pain--may cause extreme discomfort that:  · Prevents sleep.  · Decreases the ability to breathe deeply and to cough. This can result in pneumonia or other upper airway infections.  · Causes the heart to beat more quickly and the blood pressure to be higher.  · Increases the risk for constipation and bloating.  · Decreases the ability of wounds to heal.  · May result in depression, increased anxiety, and feelings of helplessness.  Relieving pain before surgery (preoperatively) is also important because it lessens pain that you have after surgery (postoperatively). Patients who receive pain relief both before and after surgery experience greater pain relief than those who receive pain relief only after surgery. Let your health care provider know if you are having uncontrolled pain. This is very important. Pain after surgery is more difficult to manage if it is severe, so receiving prompt and adequate treatment of acute pain is necessary. If you become constipated after taking pain medicine, drink more liquids if you can. Your health care provider may have you take a mild laxative.  PAIN CONTROL METHODS  Your health care providers follow policies and procedures about the management of your pain. These guidelines should be explained to you before surgery. Plans for pain control after surgery must be decided upon by you and your health care provider and put into use with your full understanding and agreement. Do not be afraid to ask questions about the care that you are receiving.  Your health care providers will attempt to control your pain in various ways, and these methods may be used together (multimodal  analgesia). Using this approach has many benefits for you, including being able to eat, move around, and leave the hospital sooner.  As-Needed Pain Control  · You may be given pain medicine through an IV tube or as a pill or liquid that you can swallow. Let your health care provider know when you are having pain, and he or she will give you the pain medicine that is ordered for you.  IV Patient-Controlled Analgesia (PCA) Pump  · You can receive your pain medicine through an IV tube that goes into one of your veins. You can control the amount of pain medicine that you get. The pain medicine is controlled by a pump. When you push the button that is hooked up to this pump, you receive a specific amount of pain medicine. This button should be pushed only by you or by someone who is specifically assigned by you to do so. It is set up to keep you from accidentally giving yourself too much pain medicine. You will be able to start using your pain pump in the recovery room after your surgery. This method can be helpful for most types of surgery.  · Tell your health care provider:    If you are having too much pain.    If you are feeling too sleepy or nauseous.  Continuous Epidural Pain Control  · A thin, soft tube (catheter) is put into your back, outside the outer layer of your spinal cord. Pain medicine flows through the catheter to lessen pain in areas of your body that are below the level of catheter placement. Continuous epidural pain control may work best for you   if you are having surgery on your abdomen, hip area, or legs. The epidural catheter is usually put into your back shortly before surgery. It is left in until you can eat, take medicine by mouth, pass urine, and have a bowel movement.  · Giving pain medicine through the epidural catheter may help you to heal more quickly because you can do these things sooner:    Regain normal bowel and bladder function.    Return to eating.    Get up and walk.  Medicine That  Numbs the Area (Local Anesthetic)  You may be given pain medicine:  · As an injection near the area of the pain (local infiltration).  · As an injection near the nerve that controls the sensation to a specific part of your body (peripheral nerve block).  · In your spine to block pain (spinal block).  · Through a local anesthetic reservoir pump. If your surgeon or anesthesiologist selects this option as a part of your pain control, one or more thin, soft tubes will be inserted into your incision site(s) at the end of surgery. These tubes will be connected to a device that is filled with a non-narcotic pain medicine. This medicine gradually empties into your incision site over the next several days. Usually, after all of the medicine is used, your health care provider will remove the tubes and throw away the device.  Opioids  · Moderate to moderately severe acute pain after surgery may respond to opioids. Opioids are narcotic pain medicine. Opioids are often combined with non-narcotic medicines to improve pain relief, lower the risk of side effects, and reduce the chance of addiction.  · If you follow your health care provider's directions about taking opioids and you do not have a history of substance abuse, your risk of becoming addicted is very small. To prevent addiction, opioids are given for short periods of time in careful doses.  Other Methods of Pain Control  · Steroids.  · Physical therapy.  · Heat and cold therapy.  · Compression, such as wrapping an elastic bandage around the area of the pain.  · Massage.     This information is not intended to replace advice given to you by your health care provider. Make sure you discuss any questions you have with your health care provider.     Document Released: 12/10/2002 Document Revised: 10/10/2014 Document Reviewed: 12/14/2010  Elsevier Interactive Patient Education ©2016 [employer]

## 2015-12-28 NOTE — ED Notes (Signed)
Pt informed to return if any life threatening symptoms occur.  

## 2015-12-28 NOTE — ED Notes (Signed)
Pt to ed with c/o left side abd pain since surgery for hernia 1 week ago.

## 2015-12-29 ENCOUNTER — Telehealth: Payer: Self-pay | Admitting: General Surgery

## 2015-12-29 MED ORDER — PROMETHAZINE HCL 25 MG PO TABS: 25.0000 mg | ORAL_TABLET | Freq: Four times a day (QID) | ORAL | Status: AC | PRN

## 2015-12-29 NOTE — Telephone Encounter (Signed)
TONY Urton(PT'S SPOUSE)CALLED TO ASK FOR ADVICE.PT IS UNABLE TO KEEP ANYTHING DOWN & IS IN PAIN.HERNIA SX 12-17-15 BY DR Jamal Collin.HE ASKED TO BE CALLED @ [phone].HOWEVER,NO ONE IS  [place] TO SPEAK TO.

## 2015-12-29 NOTE — Telephone Encounter (Signed)
I talked with [name] with permission from pt, Advised to continue clear liquids and that a RX would be sent in for Zofran. He states he had her at the ED yesterday and they stated we needed to follow up with her. [name] called back and stated that she was already using Zofran and it was not helping.

## 2015-12-29 NOTE — Telephone Encounter (Signed)
Phenergan called in #10, Nicole Kindred aware. He is to call back tomorrow with a status update.
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
## 2015-12-31 ENCOUNTER — Ambulatory Visit (INDEPENDENT_AMBULATORY_CARE_PROVIDER_SITE_OTHER): Payer: Medicaid Other | Admitting: General Surgery

## 2015-12-31 ENCOUNTER — Encounter: Payer: Self-pay | Admitting: General Surgery

## 2015-12-31 VITALS — BP 136/74 | HR 84 | Temp 97.6°F | Resp 14 | Ht 64.0 in | Wt 204.0 lb

## 2015-12-31 DIAGNOSIS — K439 Ventral hernia without obstruction or gangrene: Secondary | ICD-10-CM

## 2015-12-31 NOTE — Progress Notes (Signed)
Here today for postoperaritve visit with complaints of nausea, vomiting and fevers. The last vomiting episode was last night. Last Bm was this morning. She is able to tolerate popsicle and jello today.She states she can see a stitch at the upper incision. States her husband removed it. I have reviewed the history of present illness with the patient. Her biggest complaint is not being able to sleep. Repair intact, incision clean and intact, no drainage.  Healing as expected. Expect full recovery. The patient has symptoms of pain far more than what would be expected at this stage after the hernia repair. This is likely is not related to any apparent problems associated with the repair or the use of mesh.  Patient states she has an appointment at the [facility] (Phone: [phone]) in [place] for April 11 but has not been seen there in the past. Message has been left for Pain Clinic to call our office and make sure that she has an appointment and that they can assume care for pain management. Follow up as scheduled on 01-26-16. RX for oxycodone #12 qhs prn sleep, advised no further RX after this.   PCP:  Lorelee Market This information has been scribed by Karie Fetch RN, BSN,BC.

## 2015-12-31 NOTE — Patient Instructions (Signed)
The patient is aware to call back for any questions or concerns.  

## 2016-01-06 ENCOUNTER — Encounter: Payer: Self-pay | Admitting: General Surgery

## 2016-01-11 ENCOUNTER — Telehealth: Payer: Self-pay | Admitting: *Deleted

## 2016-01-11 ENCOUNTER — Ambulatory Visit: Payer: Medicaid Other | Admitting: General Surgery

## 2016-01-11 NOTE — Telephone Encounter (Signed)
Patient called today and states she noticed that the ventral hernia incision is open and draining out a little blood and some yellow discharge. We scheduled patient to come in and see the doctor at 3:00 pm.

## 2016-01-11 NOTE — Telephone Encounter (Signed)
Message left for patient returning her call that she left with the answering service on Saturday, 01-09-16 at 6:31 pm.  Patient stated to answering service that she was having post op complications and pus around incision. Just wanted to make sure that Dr. Rochel Brome had contacted the patient and that she is improving.   This patient is status post ventral hernia repair done 12-17-15. She is currently scheduled for follow up with Dr. Jamal Collin on 01-26-16 at 11 am.

## 2016-01-11 NOTE — Telephone Encounter (Signed)
Patient called back and yes she did speak with Dr.Smith over the weekend.

## 2016-01-19 ENCOUNTER — Telehealth: Payer: Self-pay | Admitting: *Deleted

## 2016-01-19 NOTE — Telephone Encounter (Signed)
Patients incision is open and draining. Wants to know what she can do

## 2016-01-19 NOTE — Telephone Encounter (Signed)
Patient stated that she can not come today and she will just call back when she can

## 2016-01-19 NOTE — Telephone Encounter (Signed)
She needs a nurse appt.

## 2016-01-20 ENCOUNTER — Telehealth: Payer: Self-pay | Admitting: General Surgery

## 2016-01-20 NOTE — Telephone Encounter (Signed)
Patient has called and wanted to make an appointment with Dr Adonis Huguenin for hernia. After reviewing her chart, dr Adonis Huguenin had referred the patient to [facility] hernia center and she was seen by Dr Jamal Collin 12/17/15 that preformed a ventral hernia repair with mesh. She states that her incision is draining and has puss. I have advised her that she will need to contact Dr Angie Fava office. Per the notes she has contacted his office on 01/19/16 however she stated that she did not want to see him. I have asked her if she would like to talk to our nurse and she stated, "never mind" and hung up.

## 2016-01-26 ENCOUNTER — Ambulatory Visit: Payer: Medicaid Other | Admitting: General Surgery

## 2016-02-01 ENCOUNTER — Other Ambulatory Visit: Payer: Self-pay | Admitting: *Deleted

## 2016-02-01 DIAGNOSIS — D696 Thrombocytopenia, unspecified: Secondary | ICD-10-CM

## 2016-02-02 ENCOUNTER — Inpatient Hospital Stay: Payer: Medicaid Other | Attending: Oncology

## 2016-02-02 DIAGNOSIS — Z79899 Other long term (current) drug therapy: Secondary | ICD-10-CM | POA: Insufficient documentation

## 2016-02-02 DIAGNOSIS — D696 Thrombocytopenia, unspecified: Secondary | ICD-10-CM | POA: Insufficient documentation

## 2016-02-09 ENCOUNTER — Emergency Department: Payer: Medicaid Other

## 2016-02-09 ENCOUNTER — Emergency Department
Admission: EM | Admit: 2016-02-09 | Discharge: 2016-02-10 | Disposition: A | Discharge status: Home / Self Care | Payer: Medicaid Other | Attending: Emergency Medicine | Admitting: Emergency Medicine

## 2016-02-09 DIAGNOSIS — J40 Bronchitis, not specified as acute or chronic: Secondary | ICD-10-CM | POA: Insufficient documentation

## 2016-02-09 DIAGNOSIS — J449 Chronic obstructive pulmonary disease, unspecified: Secondary | ICD-10-CM | POA: Diagnosis not present

## 2016-02-09 DIAGNOSIS — Z79899 Other long term (current) drug therapy: Secondary | ICD-10-CM | POA: Diagnosis not present

## 2016-02-09 DIAGNOSIS — R109 Unspecified abdominal pain: Secondary | ICD-10-CM | POA: Diagnosis not present

## 2016-02-09 DIAGNOSIS — N189 Chronic kidney disease, unspecified: Secondary | ICD-10-CM | POA: Insufficient documentation

## 2016-02-09 DIAGNOSIS — F319 Bipolar disorder, unspecified: Secondary | ICD-10-CM | POA: Insufficient documentation

## 2016-02-09 DIAGNOSIS — F1910 Other psychoactive substance abuse, uncomplicated: Secondary | ICD-10-CM | POA: Diagnosis not present

## 2016-02-09 DIAGNOSIS — C801 Malignant (primary) neoplasm, unspecified: Secondary | ICD-10-CM | POA: Diagnosis not present

## 2016-02-09 DIAGNOSIS — G8929 Other chronic pain: Secondary | ICD-10-CM

## 2016-02-09 DIAGNOSIS — J4520 Mild intermittent asthma, uncomplicated: Secondary | ICD-10-CM | POA: Insufficient documentation

## 2016-02-09 DIAGNOSIS — R509 Fever, unspecified: Secondary | ICD-10-CM | POA: Diagnosis present

## 2016-02-09 DIAGNOSIS — E039 Hypothyroidism, unspecified: Secondary | ICD-10-CM | POA: Insufficient documentation

## 2016-02-09 DIAGNOSIS — F17200 Nicotine dependence, unspecified, uncomplicated: Secondary | ICD-10-CM | POA: Insufficient documentation

## 2016-02-09 HISTORY — DX: Malignant (primary) neoplasm, unspecified: C80.1

## 2016-02-09 LAB — COMPREHENSIVE METABOLIC PANEL
ALT: 33 U/L (ref 14–54)
ANION GAP: 10 (ref 5–15)
AST: 43 U/L — AB (ref 15–41)
Albumin: 4 g/dL (ref 3.5–5.0)
Alkaline Phosphatase: 130 U/L — ABNORMAL HIGH (ref 38–126)
BILIRUBIN TOTAL: 1.2 mg/dL (ref 0.3–1.2)
BUN: 23 mg/dL — AB (ref 6–20)
CHLORIDE: 105 mmol/L (ref 101–111)
CO2: 18 mmol/L — AB (ref 22–32)
Calcium: 8.6 mg/dL — ABNORMAL LOW (ref 8.9–10.3)
Creatinine, Ser: 1 mg/dL (ref 0.44–1.00)
GFR calc Af Amer: >60 mL/min (ref >60)
Glucose, Bld: 171 mg/dL — ABNORMAL HIGH (ref 65–99)
POTASSIUM: 3.6 mmol/L (ref 3.5–5.1)
Sodium: 133 mmol/L — ABNORMAL LOW (ref 135–145)
TOTAL PROTEIN: 7.2 g/dL (ref 6.5–8.1)

## 2016-02-09 LAB — URINALYSIS COMPLETE WITH MICROSCOPIC (ARMC ONLY)
BACTERIA UA: NONE SEEN
Bilirubin Urine: NEGATIVE
GLUCOSE, UA: NEGATIVE mg/dL
HGB URINE DIPSTICK: NEGATIVE
Ketones, ur: NEGATIVE mg/dL
LEUKOCYTES UA: NEGATIVE
NITRITE: NEGATIVE
PH: 6 (ref 5.0–8.0)
PROTEIN: NEGATIVE mg/dL
SPECIFIC GRAVITY, URINE: 1.023 (ref 1.005–1.030)

## 2016-02-09 LAB — CBC
HEMATOCRIT: 43.8 % (ref 35.0–47.0)
Hemoglobin: 14.8 g/dL (ref 12.0–16.0)
MCH: 29.8 pg (ref 26.0–34.0)
MCHC: 33.8 g/dL (ref 32.0–36.0)
MCV: 88.2 fL (ref 80.0–100.0)
Platelets: 87 10*3/uL — ABNORMAL LOW (ref 150–440)
RBC: 4.97 MIL/uL (ref 3.80–5.20)
RDW: 15 % — ABNORMAL HIGH (ref 11.5–14.5)
WBC: 5.8 10*3/uL (ref 3.6–11.0)

## 2016-02-09 LAB — LIPASE, BLOOD: Lipase: 22 U/L (ref 11–51)

## 2016-02-09 LAB — POCT PREGNANCY, URINE: PREG TEST UR: NEGATIVE

## 2016-02-09 NOTE — ED Notes (Signed)
Pt in with co n.v.d and bodyaches, also co productive cough states hx of pneumonia. Pt co generalized abd pain more to upper abd pain and recently dx with cirrhosis.

## 2016-02-10 ENCOUNTER — Emergency Department: Payer: Medicaid Other

## 2016-02-10 ENCOUNTER — Encounter: Payer: Self-pay | Admitting: Radiology

## 2016-02-10 LAB — URINE DRUG SCREEN, QUALITATIVE (ARMC ONLY)
AMPHETAMINES, UR SCREEN: NOT DETECTED
Barbiturates, Ur Screen: NOT DETECTED
Benzodiazepine, Ur Scrn: NOT DETECTED
COCAINE METABOLITE, UR ~~LOC~~: NOT DETECTED
Cannabinoid 50 Ng, Ur ~~LOC~~: NOT DETECTED
MDMA (ECSTASY) UR SCREEN: NOT DETECTED
Methadone Scn, Ur: NOT DETECTED
Opiate, Ur Screen: POSITIVE — AB
PHENCYCLIDINE (PCP) UR S: NOT DETECTED
Tricyclic, Ur Screen: POSITIVE — AB

## 2016-02-10 LAB — RAPID INFLUENZA A&B ANTIGENS
Influenza A (ARMC): NEGATIVE
Influenza B (ARMC): NEGATIVE

## 2016-02-10 LAB — CK: CK TOTAL: 55 U/L (ref 38–234)

## 2016-02-10 MED ORDER — IOPAMIDOL (ISOVUE-300) INJECTION 61%
100.0000 mL | Freq: Once | INTRAVENOUS | Status: AC | PRN
  Administered 2016-02-10: 100 mL via INTRAVENOUS

## 2016-02-10 MED ORDER — PROMETHAZINE HCL 25 MG RE SUPP: 25.0000 mg | Freq: Four times a day (QID) | RECTAL | Status: DC | PRN

## 2016-02-10 MED ORDER — AZITHROMYCIN 500 MG PO TABS
500.0000 mg | ORAL_TABLET | Freq: Once | ORAL | Status: AC
  Administered 2016-02-10: 500 mg via ORAL
  Filled 2016-02-10: qty 1

## 2016-02-10 MED ORDER — AZITHROMYCIN 500 MG PO TABS: 500.0000 mg | ORAL_TABLET | Freq: Every day | ORAL | Status: AC

## 2016-02-10 MED ORDER — SODIUM CHLORIDE 0.9 % IV BOLUS (SEPSIS)
1000.0000 mL | Freq: Once | INTRAVENOUS | Status: AC
  Administered 2016-02-10: 1000 mL via INTRAVENOUS

## 2016-02-10 MED ORDER — DIATRIZOATE MEGLUMINE & SODIUM 66-10 % PO SOLN
15.0000 mL | Freq: Once | ORAL | Status: AC
  Administered 2016-02-10: 15 mL via ORAL

## 2016-02-10 MED ORDER — ONDANSETRON HCL 4 MG/2ML IJ SOLN
4.0000 mg | Freq: Once | INTRAMUSCULAR | Status: AC
  Administered 2016-02-10: 4 mg via INTRAVENOUS
  Filled 2016-02-10: qty 2

## 2016-02-10 NOTE — ED Notes (Signed)
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
## 2016-02-10 NOTE — ED Notes (Signed)
Patient transported to CT 

## 2016-02-10 NOTE — Discharge Instructions (Signed)

## 2016-02-18 ENCOUNTER — Inpatient Hospital Stay: Payer: Medicaid Other | Admitting: *Deleted

## 2016-02-18 DIAGNOSIS — Z79899 Other long term (current) drug therapy: Secondary | ICD-10-CM | POA: Diagnosis not present

## 2016-02-18 DIAGNOSIS — M542 Cervicalgia: Secondary | ICD-10-CM

## 2016-02-18 DIAGNOSIS — D696 Thrombocytopenia, unspecified: Secondary | ICD-10-CM

## 2016-02-18 LAB — CBC WITH DIFFERENTIAL/PLATELET
BASOS ABS: 0 10*3/uL (ref 0–0.1)
Basophils Relative: 0 %
EOS ABS: 0.1 10*3/uL (ref 0–0.7)
EOS PCT: 2 %
HCT: 33.4 % — ABNORMAL LOW (ref 35.0–47.0)
HEMOGLOBIN: 11.5 g/dL — AB (ref 12.0–16.0)
LYMPHS ABS: 0.7 10*3/uL — AB (ref 1.0–3.6)
LYMPHS PCT: 17 %
MCH: 30.4 pg (ref 26.0–34.0)
MCHC: 34.4 g/dL (ref 32.0–36.0)
MCV: 88.4 fL (ref 80.0–100.0)
Monocytes Absolute: 0.3 10*3/uL (ref 0.2–0.9)
Monocytes Relative: 6 %
NEUTROS PCT: 75 %
Neutro Abs: 3 10*3/uL (ref 1.4–6.5)
PLATELETS: 94 10*3/uL — AB (ref 150–440)
RBC: 3.78 MIL/uL — AB (ref 3.80–5.20)
RDW: 15.1 % — ABNORMAL HIGH (ref 11.5–14.5)
WBC: 4.1 10*3/uL (ref 3.6–11.0)

## 2016-02-19 LAB — ENA+DNA/DS+SJORGEN'S
SSB (La) (ENA) Antibody, IgG: <0.2 AI (ref 0.0–0.9)
ds DNA Ab: 42 IU/mL — ABNORMAL HIGH (ref 0–9)

## 2016-02-19 LAB — ANA W/REFLEX: Anti Nuclear Antibody(ANA): POSITIVE — AB

## 2016-02-26 ENCOUNTER — Encounter: Payer: Self-pay | Admitting: Urgent Care

## 2016-02-26 ENCOUNTER — Emergency Department
Admission: EM | Admit: 2016-02-26 | Discharge: 2016-02-27 | Disposition: A | Discharge status: Home / Self Care | Payer: Medicaid Other | Attending: Emergency Medicine | Admitting: Emergency Medicine

## 2016-02-26 DIAGNOSIS — K7460 Unspecified cirrhosis of liver: Secondary | ICD-10-CM | POA: Diagnosis not present

## 2016-02-26 DIAGNOSIS — E039 Hypothyroidism, unspecified: Secondary | ICD-10-CM | POA: Insufficient documentation

## 2016-02-26 DIAGNOSIS — F17200 Nicotine dependence, unspecified, uncomplicated: Secondary | ICD-10-CM | POA: Insufficient documentation

## 2016-02-26 DIAGNOSIS — E876 Hypokalemia: Secondary | ICD-10-CM | POA: Diagnosis not present

## 2016-02-26 DIAGNOSIS — J4520 Mild intermittent asthma, uncomplicated: Secondary | ICD-10-CM | POA: Diagnosis not present

## 2016-02-26 DIAGNOSIS — R531 Weakness: Secondary | ICD-10-CM | POA: Insufficient documentation

## 2016-02-26 DIAGNOSIS — R55 Syncope and collapse: Secondary | ICD-10-CM

## 2016-02-26 DIAGNOSIS — E7220 Disorder of urea cycle metabolism, unspecified: Secondary | ICD-10-CM

## 2016-02-26 DIAGNOSIS — Z7951 Long term (current) use of inhaled steroids: Secondary | ICD-10-CM | POA: Diagnosis not present

## 2016-02-26 DIAGNOSIS — Z79899 Other long term (current) drug therapy: Secondary | ICD-10-CM | POA: Insufficient documentation

## 2016-02-26 DIAGNOSIS — I959 Hypotension, unspecified: Secondary | ICD-10-CM | POA: Diagnosis present

## 2016-02-26 DIAGNOSIS — F319 Bipolar disorder, unspecified: Secondary | ICD-10-CM | POA: Insufficient documentation

## 2016-02-26 DIAGNOSIS — C801 Malignant (primary) neoplasm, unspecified: Secondary | ICD-10-CM | POA: Diagnosis not present

## 2016-02-26 NOTE — ED Notes (Signed)
Patient presents to ED via EMS from home. Patient reported to have had a syncopal episode tonight because she was so weak. She is on ABX for bronchitis. Significant other reported to EMS that patient had an angioedema reaction yesterday and he had to use her Epipen. Presents lethargic. EMS reports that patient was 90/30s en route to hospital.

## 2016-02-27 ENCOUNTER — Emergency Department: Payer: Medicaid Other

## 2016-02-27 LAB — COMPREHENSIVE METABOLIC PANEL
ALBUMIN: 3.4 g/dL — AB (ref 3.5–5.0)
ALK PHOS: 148 U/L — AB (ref 38–126)
ALT: 25 U/L (ref 14–54)
AST: 38 U/L (ref 15–41)
Anion gap: 7 (ref 5–15)
BUN: 15 mg/dL (ref 6–20)
CALCIUM: 8.9 mg/dL (ref 8.9–10.3)
CHLORIDE: 109 mmol/L (ref 101–111)
CO2: 24 mmol/L (ref 22–32)
CREATININE: 0.9 mg/dL (ref 0.44–1.00)
GFR calc non Af Amer: >60 mL/min (ref >60)
GLUCOSE: 120 mg/dL — AB (ref 65–99)
Potassium: 2.9 mmol/L — CL (ref 3.5–5.1)
SODIUM: 140 mmol/L (ref 135–145)
Total Bilirubin: 0.6 mg/dL (ref 0.3–1.2)
Total Protein: 6.6 g/dL (ref 6.5–8.1)

## 2016-02-27 LAB — URINALYSIS COMPLETE WITH MICROSCOPIC (ARMC ONLY)
BILIRUBIN URINE: NEGATIVE
Glucose, UA: NEGATIVE mg/dL
HGB URINE DIPSTICK: NEGATIVE
Ketones, ur: NEGATIVE mg/dL
Leukocytes, UA: NEGATIVE
Nitrite: NEGATIVE
PH: 7 (ref 5.0–8.0)
PROTEIN: NEGATIVE mg/dL
RBC / HPF: NONE SEEN RBC/hpf (ref 0–5)
Specific Gravity, Urine: 1.021 (ref 1.005–1.030)

## 2016-02-27 LAB — BLOOD GAS, VENOUS
Acid-Base Excess: 1.4 mmol/L (ref 0.0–3.0)
BICARBONATE: 24.7 meq/L (ref 21.0–28.0)
PH VEN: 7.47 — AB (ref 7.320–7.430)
Patient temperature: 37
pCO2, Ven: 34 mmHg — ABNORMAL LOW (ref 44.0–60.0)
pO2, Ven: <31 mmHg — ABNORMAL LOW (ref 31.0–45.0)

## 2016-02-27 LAB — CBC
HCT: 36.6 % (ref 35.0–47.0)
HEMOGLOBIN: 12.3 g/dL (ref 12.0–16.0)
MCH: 30 pg (ref 26.0–34.0)
MCHC: 33.7 g/dL (ref 32.0–36.0)
MCV: 89.1 fL (ref 80.0–100.0)
PLATELETS: 122 10*3/uL — AB (ref 150–440)
RBC: 4.1 MIL/uL (ref 3.80–5.20)
RDW: 15.3 % — ABNORMAL HIGH (ref 11.5–14.5)
WBC: 5.4 10*3/uL (ref 3.6–11.0)

## 2016-02-27 LAB — TROPONIN I: Troponin I: <0.03 ng/mL (ref <0.031)

## 2016-02-27 LAB — AMMONIA: AMMONIA: 44 umol/L — AB (ref 9–35)

## 2016-02-27 LAB — LIPASE, BLOOD: Lipase: 29 U/L (ref 11–51)

## 2016-02-27 MED ORDER — LACTULOSE 10 GM/15ML PO SOLN: 10.0000 g | Freq: Three times a day (TID) | ORAL | Status: DC

## 2016-02-27 MED ORDER — MAGNESIUM SULFATE 2 GM/50ML IV SOLN
2.0000 g | Freq: Once | INTRAVENOUS | Status: AC
  Administered 2016-02-27: 2 g via INTRAVENOUS
  Filled 2016-02-27: qty 50

## 2016-02-27 MED ORDER — LORAZEPAM 2 MG/ML IJ SOLN
1.0000 mg | Freq: Once | INTRAMUSCULAR | Status: AC
  Administered 2016-02-27: 1 mg via INTRAVENOUS
  Filled 2016-02-27: qty 1

## 2016-02-27 MED ORDER — SODIUM CHLORIDE 0.9 % IV BOLUS (SEPSIS)
1000.0000 mL | Freq: Once | INTRAVENOUS | Status: AC
  Administered 2016-02-27: 1000 mL via INTRAVENOUS

## 2016-02-27 MED ORDER — LACTULOSE 10 GM/15ML PO SOLN
10.0000 g | Freq: Once | ORAL | Status: AC
  Administered 2016-02-27: 10 g via ORAL
  Filled 2016-02-27: qty 30

## 2016-02-27 MED ORDER — POTASSIUM CHLORIDE ER 10 MEQ PO TBCR: 20.0000 meq | EXTENDED_RELEASE_TABLET | Freq: Every day | ORAL | Status: AC

## 2016-02-27 MED ORDER — POTASSIUM CHLORIDE 20 MEQ PO PACK
40.0000 meq | PACK | Freq: Once | ORAL | Status: AC
  Administered 2016-02-27: 40 meq via ORAL
  Filled 2016-02-27: qty 2

## 2016-02-27 NOTE — ED Notes (Addendum)
Patient reporting that she is a Dr. Linton Ham patient and she is going to need Dilaudid for pain. Patient reports that she has not had pain medication since 1400. MD made aware, but not willing to provide pain medication. Patient reports that she is anxious and MD did place order for Ativan 1mg  IVP - order carried by this RN. Patient upset. States, "I hate this fucking hospital. That woman (doctor) told me that she wasn't going to admit me. Dr. Linton Ham said I needed to be admitted if I wasn't better on Friday". RN explained to patient that she has been here less than an hour, and is already on ABX, therefore there may be no indication for her to be admitted. RN goes on to tell patient that we have no results at this time to base an admission decision off of. Patient asked to remain patient and give [place] time to determine what was going on. Patient states, "See I like the nurses. The doctors are mean assess. Again, that is why I hate this fucking hospital". MD made aware of conversation with patient.

## 2016-02-27 NOTE — ED Notes (Signed)
Patient observed sleeping at this time. NAD noted. No anticipated needs identified by nursing staff. Will continue to monitor.

## 2016-02-27 NOTE — ED Notes (Signed)
Patient resting in room with NAD noted. No needs identified at this time. Patient has been self removing monitoring equipment despite being asked to leave it in place. Will continue monitor.

## 2016-02-27 NOTE — Discharge Instructions (Signed)
Hypokalemia Hypokalemia means that the amount of potassium in the blood is lower than normal.Potassium is a chemical, called an electrolyte, that helps regulate the amount of fluid in the body. It also stimulates muscle contraction and helps nerves function properly.Most of the body's potassium is inside of cells, and only a very small amount is in the blood. Because the amount in the blood is so small, minor changes can be life-threatening. CAUSES  Antibiotics.  Diarrhea or vomiting.  Using laxatives too much, which can cause diarrhea.  Chronic kidney disease.  Water pills (diuretics).  Eating disorders (bulimia).  Low magnesium level.  Sweating a lot. SIGNS AND SYMPTOMS  Weakness.  Constipation.  Fatigue.  Muscle cramps.  Mental confusion.  Skipped heartbeats or irregular heartbeat (palpitations).  Tingling or numbness. DIAGNOSIS  Your health care provider can diagnose hypokalemia with blood tests. In addition to checking your potassium level, your health care provider may also check other lab tests. TREATMENT Hypokalemia can be treated with potassium supplements taken by mouth or adjustments in your current medicines. If your potassium level is very low, you may need to get potassium through a vein (IV) and be monitored in the hospital. A diet high in potassium is also helpful. Foods high in potassium are:  Nuts, such as peanuts and pistachios.  Seeds, such as sunflower seeds and pumpkin seeds.  Peas, lentils, and lima beans.  Whole grain and bran cereals and breads.  Fresh fruit and vegetables, such as apricots, avocado, bananas, cantaloupe, kiwi, oranges, tomatoes, asparagus, and potatoes.  Orange and tomato juices.  Red meats.  Fruit yogurt. HOME CARE INSTRUCTIONS  Take all medicines as prescribed by your health care provider.  Maintain a healthy diet by including nutritious food, such as fruits, vegetables, nuts, whole grains, and lean meats.  If  you are taking a laxative, be sure to follow the directions on the label. SEEK MEDICAL CARE IF:  Your weakness gets worse.  You feel your heart pounding or racing.  You are vomiting or having diarrhea.  You are diabetic and having trouble keeping your blood glucose in the normal range. SEEK IMMEDIATE MEDICAL CARE IF:  You have chest pain, shortness of breath, or dizziness.  You are vomiting or having diarrhea for more than 2 days.  You faint. MAKE SURE YOU:   Understand these instructions.  Will watch your condition.  Will get help right away if you are not doing well or get worse.   This information is not intended to replace advice given to you by your health care provider. Make sure you discuss any questions you have with your health care provider.   Document Released: 09/19/2005 Document Revised: 10/10/2014 Document Reviewed: 03/22/2013 Elsevier Interactive Patient Education 2016 [employer]  Weakness Weakness is a lack of strength. It may be felt all over the body (generalized) or in one specific part of the body (focal). Some causes of weakness can be serious. You may need further medical evaluation, especially if you are elderly or you have a history of immunosuppression (such as chemotherapy or HIV), kidney disease, heart disease, or diabetes. CAUSES  Weakness can be caused by many different things, including:  Infection.  Physical exhaustion.  Internal bleeding or other blood loss that results in a lack of red blood cells (anemia).  Dehydration. This cause is more common in elderly people.  Side effects or electrolyte abnormalities from medicines, such as pain medicines or sedatives.  Emotional distress, anxiety, or depression.  Circulation problems, especially  severe peripheral arterial disease.  Heart disease, such as rapid atrial fibrillation, bradycardia, or heart failure.  Nervous system disorders, such as Guillain-Barr syndrome, multiple  sclerosis, or stroke. DIAGNOSIS  To find the cause of your weakness, your caregiver will take your history and perform a physical exam. Lab tests or X-rays may also be ordered, if needed. TREATMENT  Treatment of weakness depends on the cause of your symptoms and can vary greatly. HOME CARE INSTRUCTIONS   Rest as needed.  Eat a well-balanced diet.  Try to get some exercise every day.  Only take over-the-counter or prescription medicines as directed by your caregiver. SEEK MEDICAL CARE IF:   Your weakness seems to be getting worse or spreads to other parts of your body.  You develop new aches or pains. SEEK IMMEDIATE MEDICAL CARE IF:   You cannot perform your normal daily activities, such as getting dressed and feeding yourself.  You cannot walk up and down stairs, or you feel exhausted when you do so.  You have shortness of breath or chest pain.  You have difficulty moving parts of your body.  You have weakness in only one area of the body or on only one side of the body.  You have a fever.  You have trouble speaking or swallowing.  You cannot control your bladder or bowel movements.  You have black or bloody vomit or stools. MAKE SURE YOU:  Understand these instructions.  Will watch your condition.  Will get help right away if you are not doing well or get worse.   This information is not intended to replace advice given to you by your health care provider. Make sure you discuss any questions you have with your health care provider.   Document Released: 09/19/2005 Document Revised: 03/20/2012 Document Reviewed: 11/18/2011 Elsevier Interactive Patient Education [employer].
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
## 2016-02-27 NOTE — ED Notes (Signed)
Discharge discussed with patient by Dr. Dahlia Client.

## 2016-02-27 NOTE — ED Notes (Signed)
Dr Dahlia Client notified of potasium of 2.9

## 2016-03-29 ENCOUNTER — Encounter: Payer: Self-pay | Admitting: *Deleted

## 2016-03-30 ENCOUNTER — Ambulatory Visit: Payer: Medicaid Other | Admitting: Anesthesiology

## 2016-03-30 ENCOUNTER — Ambulatory Visit
Admission: RE | Admit: 2016-03-30 | Discharge: 2016-03-30 | Disposition: A | Discharge status: Home Health | Payer: Medicaid Other | Source: Ambulatory Visit | Attending: Gastroenterology | Admitting: Gastroenterology

## 2016-03-30 ENCOUNTER — Encounter
Admission: RE | Disposition: A | Discharge status: Home Health | Payer: Self-pay | Source: Ambulatory Visit | Attending: Gastroenterology

## 2016-03-30 ENCOUNTER — Encounter: Payer: Self-pay | Admitting: *Deleted

## 2016-03-30 DIAGNOSIS — F17200 Nicotine dependence, unspecified, uncomplicated: Secondary | ICD-10-CM | POA: Diagnosis not present

## 2016-03-30 DIAGNOSIS — M797 Fibromyalgia: Secondary | ICD-10-CM | POA: Diagnosis not present

## 2016-03-30 DIAGNOSIS — K210 Gastro-esophageal reflux disease with esophagitis: Secondary | ICD-10-CM | POA: Diagnosis not present

## 2016-03-30 DIAGNOSIS — K7460 Unspecified cirrhosis of liver: Secondary | ICD-10-CM | POA: Insufficient documentation

## 2016-03-30 DIAGNOSIS — E039 Hypothyroidism, unspecified: Secondary | ICD-10-CM | POA: Insufficient documentation

## 2016-03-30 DIAGNOSIS — F4310 Post-traumatic stress disorder, unspecified: Secondary | ICD-10-CM | POA: Insufficient documentation

## 2016-03-30 DIAGNOSIS — B181 Chronic viral hepatitis B without delta-agent: Secondary | ICD-10-CM | POA: Insufficient documentation

## 2016-03-30 DIAGNOSIS — K2950 Unspecified chronic gastritis without bleeding: Secondary | ICD-10-CM | POA: Insufficient documentation

## 2016-03-30 DIAGNOSIS — J449 Chronic obstructive pulmonary disease, unspecified: Secondary | ICD-10-CM | POA: Insufficient documentation

## 2016-03-30 DIAGNOSIS — Z79899 Other long term (current) drug therapy: Secondary | ICD-10-CM | POA: Insufficient documentation

## 2016-03-30 DIAGNOSIS — Z856 Personal history of leukemia: Secondary | ICD-10-CM | POA: Diagnosis not present

## 2016-03-30 DIAGNOSIS — N189 Chronic kidney disease, unspecified: Secondary | ICD-10-CM | POA: Diagnosis not present

## 2016-03-30 DIAGNOSIS — Z8614 Personal history of Methicillin resistant Staphylococcus aureus infection: Secondary | ICD-10-CM | POA: Diagnosis not present

## 2016-03-30 DIAGNOSIS — F319 Bipolar disorder, unspecified: Secondary | ICD-10-CM | POA: Insufficient documentation

## 2016-03-30 HISTORY — PX: ESOPHAGOGASTRODUODENOSCOPY (EGD) WITH PROPOFOL: SHX5813

## 2016-03-30 LAB — URINE DRUG SCREEN, QUALITATIVE (ARMC ONLY)
Amphetamines, Ur Screen: NOT DETECTED
BARBITURATES, UR SCREEN: NOT DETECTED
BENZODIAZEPINE, UR SCRN: NOT DETECTED
CANNABINOID 50 NG, UR ~~LOC~~: NOT DETECTED
COCAINE METABOLITE, UR ~~LOC~~: NOT DETECTED
MDMA (Ecstasy)Ur Screen: NOT DETECTED
METHADONE SCREEN, URINE: NOT DETECTED
OPIATE, UR SCREEN: NOT DETECTED
PHENCYCLIDINE (PCP) UR S: NOT DETECTED
Tricyclic, Ur Screen: POSITIVE — AB

## 2016-03-30 LAB — POCT PREGNANCY, URINE: Preg Test, Ur: NEGATIVE

## 2016-03-30 SURGERY — ESOPHAGOGASTRODUODENOSCOPY (EGD) WITH PROPOFOL
Anesthesia: General

## 2016-03-30 MED ORDER — PROPOFOL 10 MG/ML IV BOLUS
INTRAVENOUS | Status: DC | PRN
  Administered 2016-03-30: 100 mg via INTRAVENOUS

## 2016-03-30 MED ORDER — MIDAZOLAM HCL 5 MG/5ML IJ SOLN
INTRAMUSCULAR | Status: DC | PRN
  Administered 2016-03-30: 1 mg via INTRAVENOUS

## 2016-03-30 MED ORDER — SODIUM CHLORIDE 0.9 % IV SOLN
INTRAVENOUS | Status: DC
  Administered 2016-03-30: 1000 mL via INTRAVENOUS
  Administered 2016-03-30: 11:00:00 via INTRAVENOUS

## 2016-03-30 MED ORDER — PROPOFOL 500 MG/50ML IV EMUL
INTRAVENOUS | Status: DC | PRN
  Administered 2016-03-30: 160 ug/kg/min via INTRAVENOUS

## 2016-03-30 MED ORDER — LIDOCAINE 2% (20 MG/ML) 5 ML SYRINGE
INTRAMUSCULAR | Status: DC | PRN
  Administered 2016-03-30: 40 mg via INTRAVENOUS

## 2016-03-30 MED ORDER — SODIUM CHLORIDE 0.9 % IV SOLN: INTRAVENOUS | Status: DC

## 2016-03-30 MED ORDER — FENTANYL CITRATE (PF) 100 MCG/2ML IJ SOLN
INTRAMUSCULAR | Status: DC | PRN
  Administered 2016-03-30: 50 ug via INTRAVENOUS

## 2016-03-30 NOTE — H&P (Signed)
Primary Care Physician:  Lorelee Market, MD Primary Gastroenterologist:  Dr. Vira Agar  Pre-Procedure History & Physical: HPI:  [name] is a 45 y.o. female is here for an endoscopy.   Past Medical History  Diagnosis Date  . Bipolar 1 disorder ([place])   . Other abdominal hernia     disk disease  . PTSD (post-traumatic stress disorder)   . Leg fracture   . Asthma   . GERD (gastroesophageal reflux disease)   . Scoliosis   . Agoraphobia with panic disorder   . COPD (chronic obstructive pulmonary disease) ([place])   . Depression   . Pneumonia 2016  . Heart murmur   . Hypothyroidism   . Cirrhosis of liver ([place])     recently diagnosed (12-2015)  . Hx MRSA infection     LAST HAD 2015  . Hepatitis B     recently dx per pt (12-14-15) but Epic states chronic Hep B  . Chronic kidney disease     h/o kidney stones  . Fibromyalgia   . Cancer ([place])     leukemia    Past Surgical History  Procedure Laterality Date  . Other surgical history  07/2014    left tib fib repair  . Other surgical history  07/2014    right tib fib repair  . Appendectomy  1999  . Cholecystectomy  1999  . Tubal ligation  1999  . Fracture surgery      leg  . Ventral hernia repair N/A 12/17/2015    Procedure: HERNIA REPAIR VENTRAL ADULT;  Surgeon: Christene Lye, MD;  Location: [facility] ORS;  Service: General;  Laterality: N/A;  . Hernia repair      Prior to Admission medications   Medication Sig Start Date End Date Taking? Authorizing Provider  metoCLOPramide (REGLAN) 5 MG tablet Take 5 mg by mouth 4 (four) times daily.   Yes Historical Provider, MD  ADVAIR DISKUS 250-50 MCG/DOSE AEPB Inhale 1 puff into the lungs every morning.  09/18/15   Historical Provider, MD  ALPRAZolam Duanne Moron) 0.5 MG tablet Take 1 mg by mouth 3 (three) times daily as needed.  03/24/14   Historical Provider, MD  AMITIZA 24 MCG capsule Take 24 mcg by mouth daily with breakfast.  09/21/15   Historical Provider, MD   amphetamine-dextroamphetamine (ADDERALL) 20 MG tablet Take 40 mg by mouth every morning. 09/21/15   Historical Provider, MD  cetirizine (ZYRTEC) 10 MG tablet Take 10 mg by mouth daily.  03/12/15   Historical Provider, MD  cyclobenzaprine (FLEXERIL) 10 MG tablet Take 10 mg by mouth 3 (three) times daily as needed.  03/16/15   Historical Provider, MD  EPINEPHrine (EPIPEN 2-PAK) 0.3 mg/0.3 mL IJ SOAJ injection Inject 0.3 mg into the muscle as needed.  11/19/14   Historical Provider, MD  furosemide (LASIX) 40 MG tablet Take 40 mg by mouth daily.  03/12/15   Historical Provider, MD  gabapentin (NEURONTIN) 400 MG capsule Take 400 mg by mouth 2 (two) times daily.  03/12/15   Historical Provider, MD  hydrOXYzine (VISTARIL) 50 MG capsule Take 1 capsule by mouth every 6 (six) hours as needed.  03/12/15   Historical Provider, MD  ketoconazole (NIZORAL) 2 % cream Apply 1 application topically 2 (two) times daily. 03/12/15   Historical Provider, MD  lactulose (CHRONULAC) 10 GM/15ML solution Take 15 mLs (10 g total) by mouth 3 (three) times daily. 02/27/16   Loney Hering, MD  mometasone (NASONEX) 50 MCG/ACT nasal spray Place 2  sprays into the nose daily. 03/12/15   Historical Provider, MD  NEXIUM 40 MG capsule Take 40 mg by mouth every morning.  09/15/15   Historical Provider, MD  Olopatadine HCl (PATADAY) 0.2 % SOLN Place 1 drop into both eyes daily. 10/15/14   Historical Provider, MD  ondansetron (ZOFRAN) 8 MG tablet Take 8 mg by mouth every 8 (eight) hours as needed.  10/06/15   Historical Provider, MD  polyethylene glycol powder (GLYCOLAX/MIRALAX) powder Dissolve one heaping tablespoon in 4-8 ounces of juice or water. Dink this mixture twice daily until having one solid bowel movement daily (then only take it once a day). 11/08/15   Joanne Gavel, MD  potassium chloride (K-DUR) 10 MEQ tablet Take 2 tablets (20 mEq total) by mouth daily. 02/27/16   Loney Hering, MD  PROAIR HFA 108 (90 BASE) MCG/ACT inhaler Inhale 2 puffs  into the lungs every 4 (four) hours as needed.  03/12/15   Historical Provider, MD  promethazine (PHENERGAN) 25 MG suppository Place 1 suppository (25 mg total) rectally every 6 (six) hours as needed for nausea or vomiting. 02/10/16   Gregor Hams, MD  promethazine (PHENERGAN) 25 MG tablet Take 1 tablet (25 mg total) by mouth every 6 (six) hours as needed for nausea or vomiting. 12/29/15   Seeplaputhur Robinette Haines, MD  QUEtiapine (SEROQUEL) 300 MG tablet Take 600 mg by mouth at bedtime. 04/09/15   Historical Provider, MD  rOPINIRole (REQUIP) 0.5 MG tablet Take 1 tablet by mouth at bedtime.  09/17/15   Historical Provider, MD  SPIRIVA HANDIHALER 18 MCG inhalation capsule Place 2 puffs into inhaler and inhale every morning.  09/18/15   Historical Provider, MD  Vitamin D, Ergocalciferol, (DRISDOL) 50000 UNITS CAPS capsule Take 50,000 Units by mouth once a week. 03/12/15   Historical Provider, MD    Allergies as of 03/10/2016 - Review Complete 02/27/2016  Allergen Reaction Noted  . Sulfa antibiotics Shortness Of Breath 02/18/2015  . Acetaminophen  12/23/2015  . Coconut oil Swelling 02/18/2015  . Nsaids Other (See Comments) 11/05/2015  . Other  02/18/2015  . Tramadol Anxiety 04/15/2015    Family History  Problem Relation Age of Onset  . COPD Mother   . Heart disease Mother   . Parkinson's disease Father     Social History   Social History  . Marital Status: Married    Spouse Name: N/A  . Number of Children: N/A  . Years of Education: N/A   Occupational History  . Not on file.   Social History Main Topics  . Smoking status: Current Every Day Smoker -- 0.50 packs/day for 12 years    Last Attempt to Quit: 10/12/2015  . Smokeless tobacco: Never Used  . Alcohol Use: No  . Drug Use: Yes    Special: Cocaine     Comment: pt denies during phone interview but uds + 10-25-15 for cocaine  . Sexual Activity: Not on file   Other Topics Concern  . Not on file   Social History Narrative     Review of Systems: See HPI, otherwise negative ROS  Physical Exam: BP 114/76 mmHg  Pulse 81  Temp(Src) 97.9 F (36.6 C) (Tympanic)  Resp 20  Ht 5\' 3"  (1.6 m)  Wt 88.451 kg (195 lb)  BMI 34.55 kg/m2  SpO2 99% General:   Alert,  pleasant and cooperative in NAD Head:  Normocephalic and atraumatic. Neck:  Supple; no masses or thyromegaly. Lungs:  Clear throughout to auscultation.  Heart:  Regular rate and rhythm. Abdomen:  Soft, nontender and nondistended. Normal bowel sounds, without guarding, and without rebound.   Neurologic:  Alert and  oriented x4;  grossly normal neurologically.  Impression/Plan: [name] is here for an endoscopy to be performed for screening for esophageal varices  Risks, benefits, limitations, and alternatives regarding  endoscopy have been reviewed with the patient.  Questions have been answered.  All parties agreeable.   Gaylyn Cheers, MD  03/30/2016, 11:51 AM

## 2016-03-30 NOTE — Op Note (Signed)
[facility] Gastroenterology Patient Name: [name] Procedure Date: 03/30/2016 11:52 AM MRN: [id number] Account #: [id number] Date of Birth: 1970/12/31 Admit Type: Outpatient Age: 45 Room: [facility] ENDO ROOM 3 Gender: Female Note Status: Finalized Procedure:            Upper GI endoscopy Indications:          Cirrhosis with suspected esophageal varices Providers:            Manya Silvas, MD Referring MD:         Rexene Agent. Ricky Ala, MD (Referring MD) Medicines:            Propofol per Anesthesia Complications:        No immediate complications. Procedure:            Pre-Anesthesia Assessment:                       - After reviewing the risks and benefits, the patient                        was deemed in satisfactory condition to undergo the                        procedure.                       After obtaining informed consent, the endoscope was                        passed under direct vision. Throughout the procedure,                        the patient's blood pressure, pulse, and oxygen                        saturations were monitored continuously. The Endoscope                        was introduced through the mouth, and advanced to the                        second part of duodenum. The upper GI endoscopy was                        accomplished without difficulty. The patient tolerated                        the procedure well. Findings:      LA Grade A (one or more mucosal breaks less than 5 mm, not extending       between tops of 2 mucosal folds) esophagitis with no bleeding was found.       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 I did not biopsy       due to elevate PT and low platelet count. NO VARICES SEEN.      Diffuse and patchy moderate inflammation characterized by erythema and       granularity was found in the gastric body and in the gastric antrum.       Biopsies were taken with a cold forceps for histology. Biopsies  were       taken with a cold forceps for Helicobacter pylori testing.  The examined duodenum was normal.      A small hiatal hernia was present. Impression:           - LA Grade A reflux esophagitis.                       - Gastritis. Biopsied.                       - Normal examined duodenum. Recommendation:       - Await pathology results. Take medicine for acid                        reduction in stomach. Manya Silvas, MD 03/30/2016 12:12:23 PM This report has been signed electronically. Number of Addenda: 0 Note Initiated On: 03/30/2016 11:52 AM      [facility]

## 2016-03-30 NOTE — Anesthesia Postprocedure Evaluation (Signed)
Anesthesia Post Note  Patient: [name]  Procedure(s) Performed: Procedure(s) (LRB): ESOPHAGOGASTRODUODENOSCOPY (EGD) WITH PROPOFOL (N/A)  Patient location during evaluation: PACU Anesthesia Type: General Level of consciousness: awake and alert Pain management: pain level controlled Vital Signs Assessment: post-procedure vital signs reviewed and stable Respiratory status: spontaneous breathing, nonlabored ventilation, respiratory function stable and patient connected to nasal cannula oxygen Cardiovascular status: blood pressure returned to baseline and stable Postop Assessment: no signs of nausea or vomiting Anesthetic complications: no    Last Vitals:  Filed Vitals:   03/30/16 1033 03/30/16 1212  BP: 114/76 111/77  Pulse: 81 133  Temp: 36.6 C 36.6 C  Resp: 20 14    Last Pain:  Filed Vitals:   03/30/16 1216  PainSc: [name]

## 2016-03-30 NOTE — Transfer of Care (Signed)
Immediate Anesthesia Transfer of Care Note  Patient: [name]  Procedure(s) Performed: Procedure(s): ESOPHAGOGASTRODUODENOSCOPY (EGD) WITH PROPOFOL (N/A)  Patient Location: PACU and Endoscopy Unit  Anesthesia Type:General  Level of Consciousness: awake, oriented and patient cooperative  Airway & Oxygen Therapy: Patient Spontanous Breathing and Patient connected to nasal cannula oxygen  Post-op Assessment: Report given to RN and Post -op Vital signs reviewed and stable  Post vital signs: Reviewed and stable  Last Vitals:  Filed Vitals:   03/30/16 1033  BP: 114/76  Pulse: 81  Temp: 36.6 C  Resp: 20    Last Pain:  Filed Vitals:   03/30/16 1035  PainSc: 5          Complications: No apparent anesthesia complications

## 2016-03-30 NOTE — Anesthesia Preprocedure Evaluation (Signed)
Anesthesia Evaluation  Patient identified by MRN, date of birth, ID band Patient awake    Reviewed: Allergy & Precautions, H&P , NPO status , Patient's Chart, lab work & pertinent test results, reviewed documented beta blocker date and time   Airway Mallampati: II   Neck ROM: full    Dental  (+) Poor Dentition   Pulmonary neg pulmonary ROS, neg shortness of breath, asthma , pneumonia, resolved, COPD,  COPD inhaler, Current Smoker,    Pulmonary exam normal        Cardiovascular negative cardio ROS Normal cardiovascular exam+ Valvular Problems/Murmurs  Rhythm:regular Rate:Normal     Neuro/Psych PSYCHIATRIC DISORDERS  Neuromuscular disease negative neurological ROS  negative psych ROS   GI/Hepatic negative GI ROS, Neg liver ROS, GERD  Medicated,(+) Hepatitis -, B  Endo/Other  negative endocrine ROSHypothyroidism   Renal/GU Renal diseasenegative Renal ROS  negative genitourinary   Musculoskeletal   Abdominal   Peds  Hematology negative hematology ROS (+)   Anesthesia Other Findings Past Medical History:   Bipolar 1 disorder ([place])                                     Other abdominal hernia                                         Comment:disk disease   PTSD (post-traumatic stress disorder)                        Leg fracture                                                 Asthma                                                       GERD (gastroesophageal reflux disease)                       Scoliosis                                                    Agoraphobia with panic disorder                              COPD (chronic obstructive pulmonary disease) (*              Depression                                                   Pneumonia  2016         Heart murmur                                                 Hypothyroidism                                              Cirrhosis of liver (HCC)                                       Comment:recently diagnosed (12-2015)   Hx MRSA infection                                              Comment:LAST HAD 2015   Hepatitis B                                                    Comment:recently dx per pt (12-14-15) but Epic states               chronic Hep B   Chronic kidney disease                                         Comment:h/o kidney stones   Fibromyalgia                                                 Cancer [facility])                                                   Comment:leukemia Past Surgical History:   OTHER SURGICAL HISTORY                           07/2014        Comment:left tib fib repair   OTHER SURGICAL HISTORY                           07/2014        Comment:right tib fib repair   [place]         FRACTURE SURGERY                                                Comment:leg   VENTRAL HERNIA REPAIR                           N/A 12/17/2015      Comment:Procedure: HERNIA REPAIR VENTRAL ADULT;                Surgeon: Christene Lye, MD;  Location:               [facility] ORS;  Service: General;  Laterality: N/A;   HERNIA REPAIR                                                 Reproductive/Obstetrics                             Anesthesia Physical Anesthesia Plan  ASA: III  Anesthesia Plan: General   Post-op Pain Management:    Induction:   Airway Management Planned:   Additional Equipment:   Intra-op Plan:   Post-operative Plan:   Informed Consent: I have reviewed the patients History and Physical, chart, labs and discussed the procedure including the risks, benefits and alternatives for the proposed anesthesia with the patient or authorized representative who has indicated his/her understanding  and acceptance.   Dental Advisory Given  Plan Discussed with: CRNA  Anesthesia Plan Comments:         Anesthesia Quick Evaluation

## 2016-03-31 LAB — SURGICAL PATHOLOGY

## 2016-04-01 ENCOUNTER — Encounter: Payer: Self-pay | Admitting: Unknown Physician Specialty

## 2016-05-03 NOTE — Progress Notes (Deleted)
[place]  [phone] [phone]) [phone]  ID: [name] OB: 01-Feb-1971  MR#: [id number]  [id number]  Patient Care Team: Lorelee Market, MD as PCP - General (Family Medicine) Julieanne Manson Leeanne Mannan., MD (Rheumatology) Lorelee Market, MD (Family Medicine) Seeplaputhur Robinette Haines, MD (General Surgery)  CHIEF COMPLAINT: Thrombocytopenia.  INTERVAL HISTORY: Patient returns to clinic today for repeat laboratory work and further evaluation. She continues to have multiple medical complaints which are all chronic and unchanged in nature. She denies any easy bleeding or bruising. She has no neurologic complaints. She denies any fevers.  She has a good appetite and denies weight loss.  She denies any chest pain or shortness of breath. She denies any nausea, vomiting, constipation, or diarrhea. She has no urinary complaints. Patient offers no further specific complaints.  REVIEW OF SYSTEMS:   Review of Systems  Constitutional: Negative.   Respiratory: Negative.   Cardiovascular: Negative.   Gastrointestinal: Negative.   Genitourinary: Negative.   Endo/Heme/Allergies: Negative.   Psychiatric/Behavioral: Negative.     As per HPI. Otherwise, a complete review of systems is negatve.  PAST MEDICAL HISTORY: Past Medical History:  Diagnosis Date  . Agoraphobia with panic disorder   . Asthma   . Bipolar 1 disorder ([place])   . Cancer ([place])    leukemia  . Chronic kidney disease    h/o kidney stones  . Cirrhosis of liver ([place])    recently diagnosed (12-2015)  . COPD (chronic obstructive pulmonary disease) ([place])   . Depression   . Fibromyalgia   . GERD (gastroesophageal reflux disease)   . Heart murmur   . Hepatitis B    recently dx per pt (12-14-15) but Epic states chronic Hep B  . Hx MRSA infection    LAST HAD 2015  . Hypothyroidism   . Leg fracture   . Other abdominal hernia    disk disease  . Pneumonia 2016  . PTSD (post-traumatic stress  disorder)   . Scoliosis     PAST SURGICAL HISTORY: Past Surgical History:  Procedure Laterality Date  . APPENDECTOMY  1999  . CHOLECYSTECTOMY  1999  . ESOPHAGOGASTRODUODENOSCOPY (EGD) WITH PROPOFOL N/A 03/30/2016   Procedure: ESOPHAGOGASTRODUODENOSCOPY (EGD) WITH PROPOFOL;  Surgeon: Manya Silvas, MD;  Location[facility]COPY;  Service: Endoscopy;  Laterality: N/A;  . FRACTURE SURGERY     leg  . HERNIA REPAIR    . OTHER SURGICAL HISTORY  07/2014   left tib fib repair  . OTHER SURGICAL HISTORY  07/2014   right tib fib repair  . TUBAL LIGATION  1999  . VENTRAL HERNIA REPAIR N/A 12/17/2015   Procedure: HERNIA REPAIR VENTRAL ADULT;  Surgeon: Christene Lye, MD;  Location: [facility] ORS;  Service: General;  Laterality: N/A;    FAMILY HISTORY: CVA, CAD, depression, bipolar.     ADVANCED DIRECTIVES:    HEALTH MAINTENANCE: Social History  Substance Use Topics  . Smoking status: Current Every Day Smoker    Packs/day: 0.50    Years: 12.00    Last attempt to quit: 10/12/2015  . Smokeless tobacco: Never Used  . Alcohol use No     Colonoscopy:  PAP:  Bone density:  Lipid panel:  Allergies  Allergen Reactions  . Sulfa Antibiotics Shortness Of Breath  . Acetaminophen   . Coconut Oil Swelling  . Nsaids Other (See Comments)    restless  . Other     Steroids, causes hallunications  . Tramadol Anxiety    Current Outpatient  Prescriptions  Medication Sig Dispense Refill  . ADVAIR DISKUS 250-50 MCG/DOSE AEPB Inhale 1 puff into the lungs every morning.   0  . ALPRAZolam (XANAX) 0.5 MG tablet Take 1 mg by mouth 3 (three) times daily as needed.     . AMITIZA 24 MCG capsule Take 24 mcg by mouth daily with breakfast.   0  . amphetamine-dextroamphetamine (ADDERALL) 20 MG tablet Take 40 mg by mouth every morning.  0  . cetirizine (ZYRTEC) 10 MG tablet Take 10 mg by mouth daily.   0  . cyclobenzaprine (FLEXERIL) 10 MG tablet Take 10 mg by mouth 3 (three) times daily as needed.   0   . EPINEPHrine (EPIPEN 2-PAK) 0.3 mg/0.3 mL IJ SOAJ injection Inject 0.3 mg into the muscle as needed.     . furosemide (LASIX) 40 MG tablet Take 40 mg by mouth daily.   0  . gabapentin (NEURONTIN) 400 MG capsule Take 400 mg by mouth 2 (two) times daily.   0  . hydrOXYzine (VISTARIL) 50 MG capsule Take 1 capsule by mouth every 6 (six) hours as needed.   0  . ketoconazole (NIZORAL) 2 % cream Apply 1 application topically 2 (two) times daily.  0  . lactulose (CHRONULAC) 10 GM/15ML solution Take 15 mLs (10 g total) by mouth 3 (three) times daily. 240 mL 0  . metoCLOPramide (REGLAN) 5 MG tablet Take 5 mg by mouth 4 (four) times daily.    . mometasone (NASONEX) 50 MCG/ACT nasal spray Place 2 sprays into the nose daily.  0  . NEXIUM 40 MG capsule Take 40 mg by mouth every morning.   0  . Olopatadine HCl (PATADAY) 0.2 % SOLN Place 1 drop into both eyes daily.    . ondansetron (ZOFRAN) 8 MG tablet Take 8 mg by mouth every 8 (eight) hours as needed.   0  . polyethylene glycol powder (GLYCOLAX/MIRALAX) powder Dissolve one heaping tablespoon in 4-8 ounces of juice or water. Dink this mixture twice daily until having one solid bowel movement daily (then only take it once a day). 255 g 0  . potassium chloride (K-DUR) 10 MEQ tablet Take 2 tablets (20 mEq total) by mouth daily. 6 tablet 0  . PROAIR HFA 108 (90 BASE) MCG/ACT inhaler Inhale 2 puffs into the lungs every 4 (four) hours as needed.   0  . promethazine (PHENERGAN) 25 MG suppository Place 1 suppository (25 mg total) rectally every 6 (six) hours as needed for nausea or vomiting. 12 each 0  . promethazine (PHENERGAN) 25 MG tablet Take 1 tablet (25 mg total) by mouth every 6 (six) hours as needed for nausea or vomiting. 10 tablet 0  . QUEtiapine (SEROQUEL) 300 MG tablet Take 600 mg by mouth at bedtime.  0  . rOPINIRole (REQUIP) 0.5 MG tablet Take 1 tablet by mouth at bedtime.   0  . SPIRIVA HANDIHALER 18 MCG inhalation capsule Place 2 puffs into inhaler  and inhale every morning.   0  . Vitamin D, Ergocalciferol, (DRISDOL) 50000 UNITS CAPS capsule Take 50,000 Units by mouth once a week.  0   No current facility-administered medications for this visit.     OBJECTIVE: There were no vitals filed for this visit.   There is no height or weight on file to calculate BMI.    ECOG FS:0 - Asymptomatic  General: Well-developed, well-nourished, no acute distress. Eyes: Pink conjunctiva, anicteric sclera. Lungs: Clear to auscultation bilaterally. Heart: Regular rate and rhythm. No  rubs, murmurs, or gallops. Abdomen: Soft, nontender, nondistended. No organomegaly noted, normoactive bowel sounds. Musculoskeletal: No edema, cyanosis, or clubbing. Neuro: Alert, answering all questions appropriately. Cranial nerves grossly intact. Skin: No rashes or petechiae noted. Psych: Normal affect.  LAB RESULTS:  Lab Results  Component Value Date   NA 140 02/26/2016   K 2.9 (LL) 02/26/2016   CL 109 02/26/2016   CO2 24 02/26/2016   GLUCOSE 120 (H) 02/26/2016   BUN 15 02/26/2016   CREATININE 0.90 02/26/2016   CALCIUM 8.9 02/26/2016   PROT 6.6 02/26/2016   ALBUMIN 3.4 (L) 02/26/2016   AST 38 02/26/2016   ALT 25 02/26/2016   ALKPHOS 148 (H) 02/26/2016   BILITOT 0.6 02/26/2016   GFRNONAA >60 02/26/2016   GFRAA >60 02/26/2016    Lab Results  Component Value Date   WBC 5.4 02/26/2016   NEUTROABS 3.0 02/18/2016   HGB 12.3 02/26/2016   HCT 36.6 02/26/2016   MCV 89.1 02/26/2016   PLT 122 (L) 02/26/2016     STUDIES: No results found.  ASSESSMENT: Thrombocytopenia.  PLAN:    1. Thrombocytopenia: Decreased, but stable.  Possibly multifactorial. Patient was previously was noted to have positive platelet antibodies, repeat testing today reveals they are now negative. ANA continues to be positive therefore patient will require a referral to rheumatology. Previous history, the remainder of her laboratory work was either negative or within normal limits.  Ultrasound of the spleen did not reveal splenomegaly. No intervention is needed at this time. Patient will return to clinic in 3 months with repeat laboratory work and then in 6 months with repeat laboratory work and further evaluation.  Patient expressed understanding and was in agreement with this plan. She also understands that She can call clinic at any time with any questions, concerns, or complaints.    Lloyd Huger, MD   05/03/2016 11:19 PM

## 2016-05-04 ENCOUNTER — Inpatient Hospital Stay: Payer: Medicaid Other | Admitting: Oncology

## 2016-05-04 ENCOUNTER — Inpatient Hospital Stay: Payer: Medicaid Other | Attending: Oncology

## 2017-03-17 IMAGING — US US ABDOMEN LIMITED
1 series · 14 of 25 positions shown · non-contrast
Comparison: None.

CLINICAL DATA: Elevated LFTs

EXAM:
US ABDOMEN LIMITED - RIGHT UPPER QUADRANT

[Series 1: us abdomen limited · 0.16mm/px · 14 of 29 slices shown]
[im 1/29]
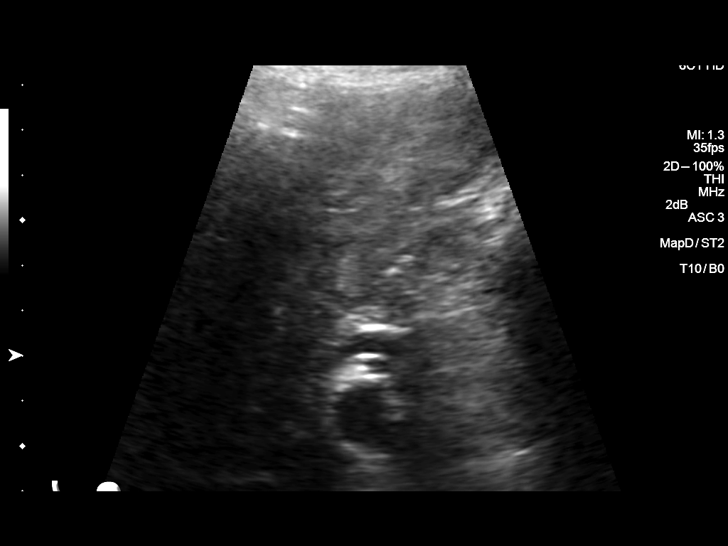
[im 3/29]
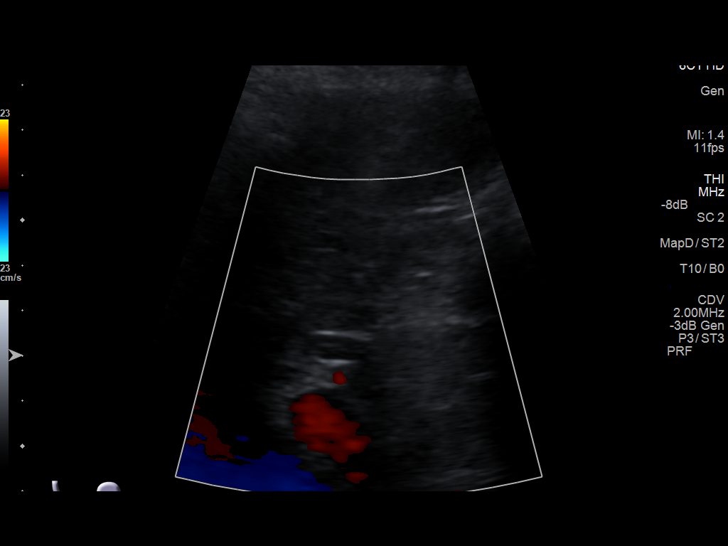
[im 5/29]
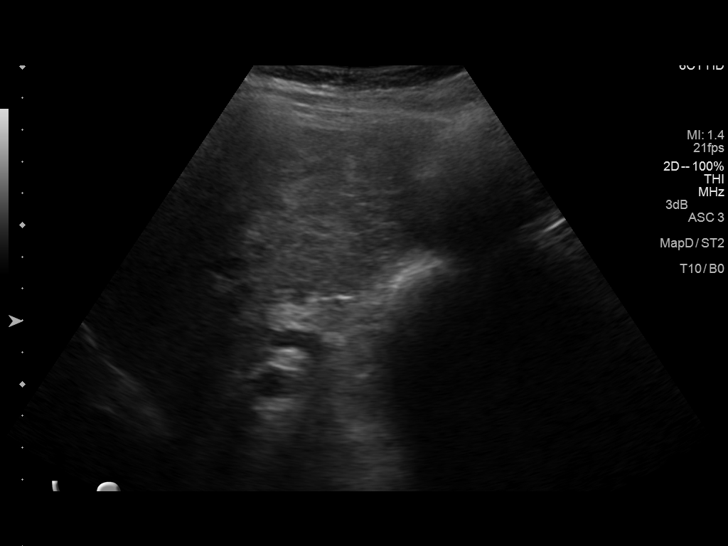
[im 8/29]
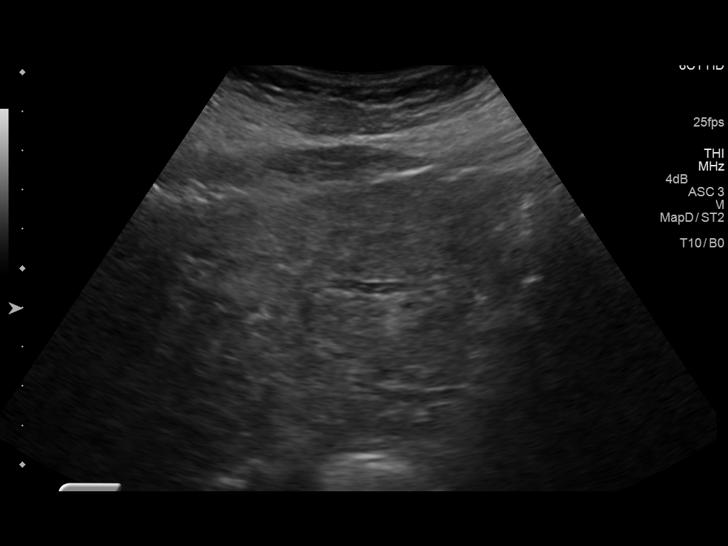
[im 10/29]
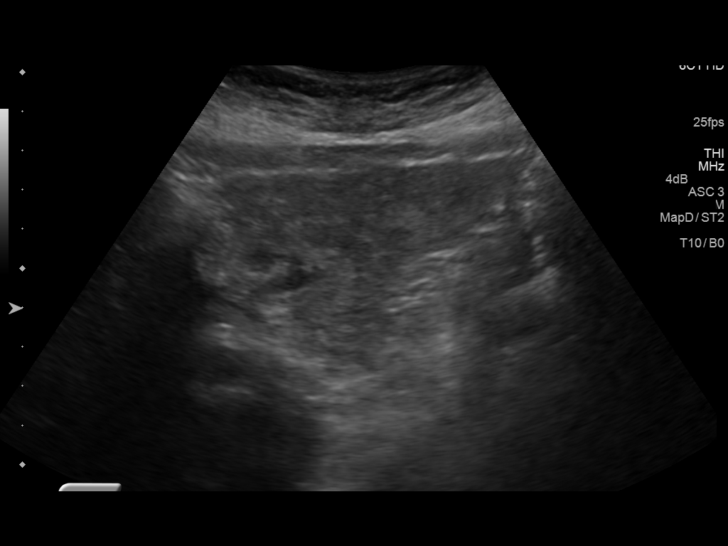
[im 11/29]
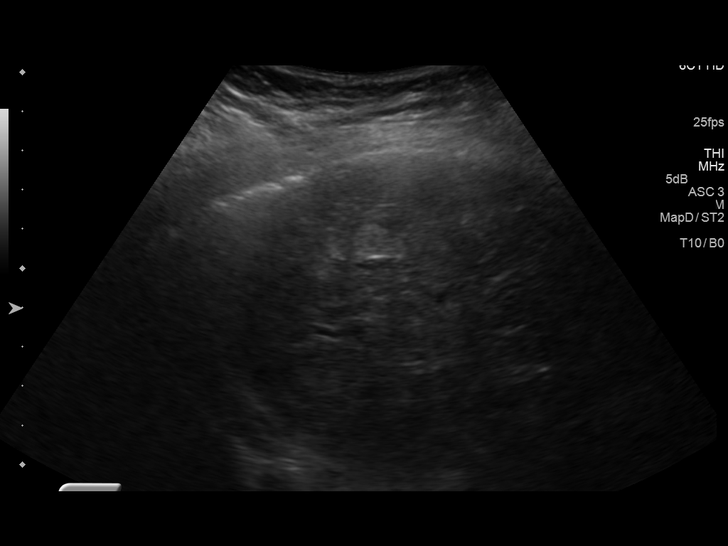
[im 13/29]
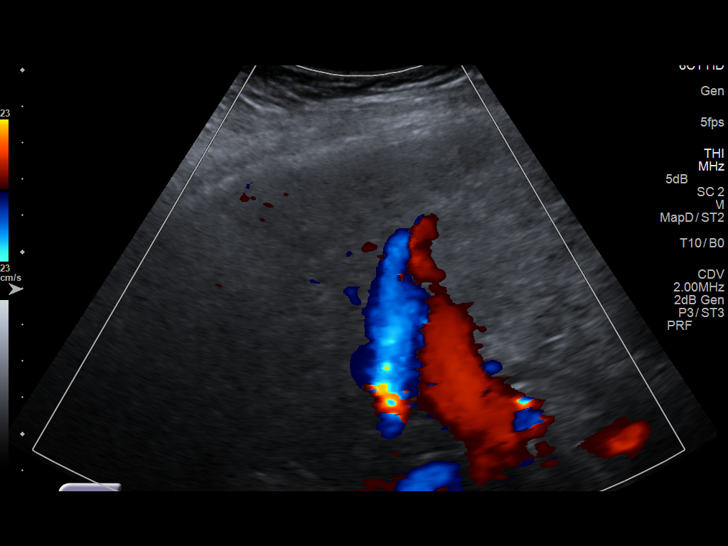
[im 16/29]
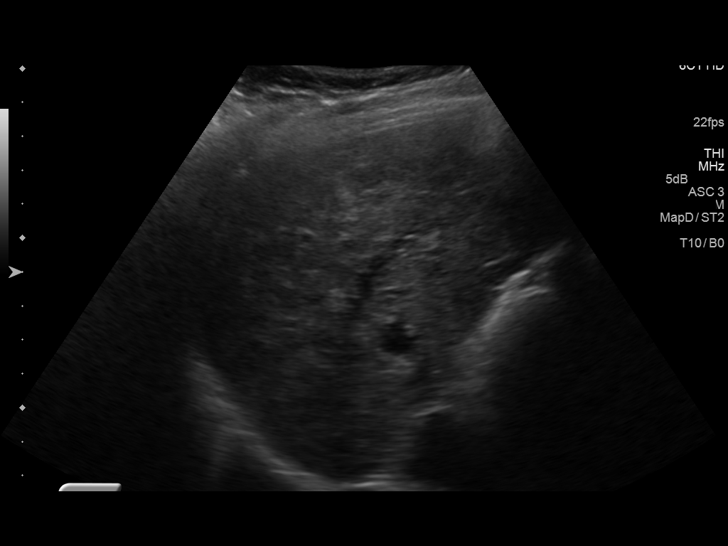
[im 18/29]
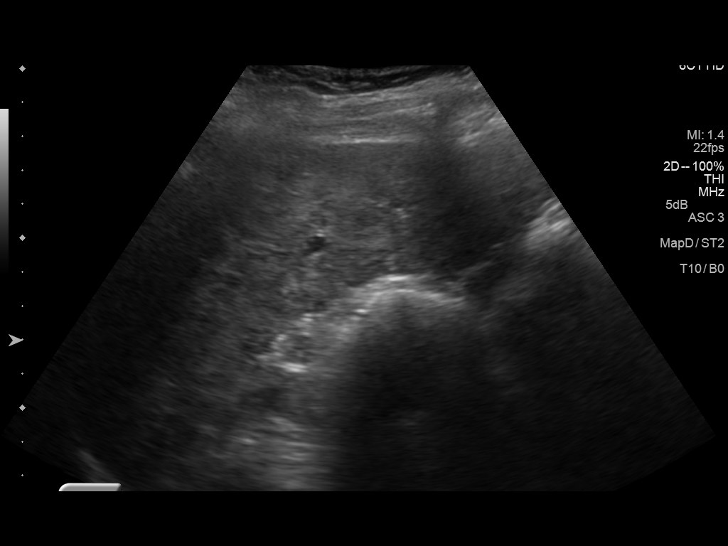
[im 19/29]
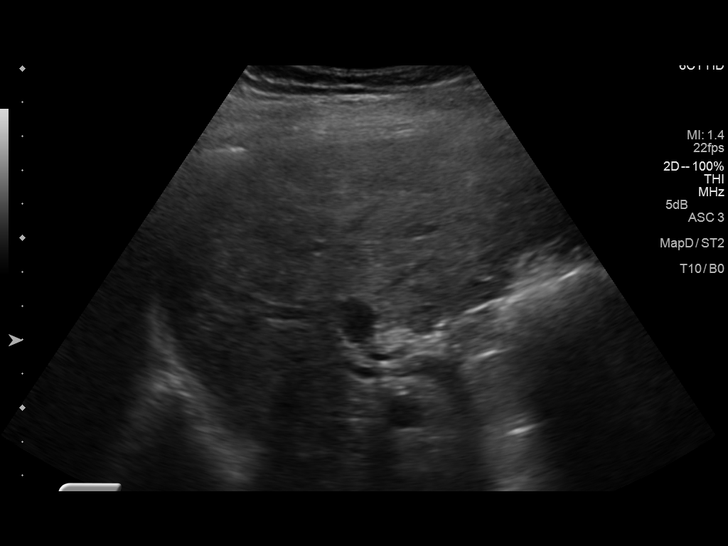
[im 22/29]
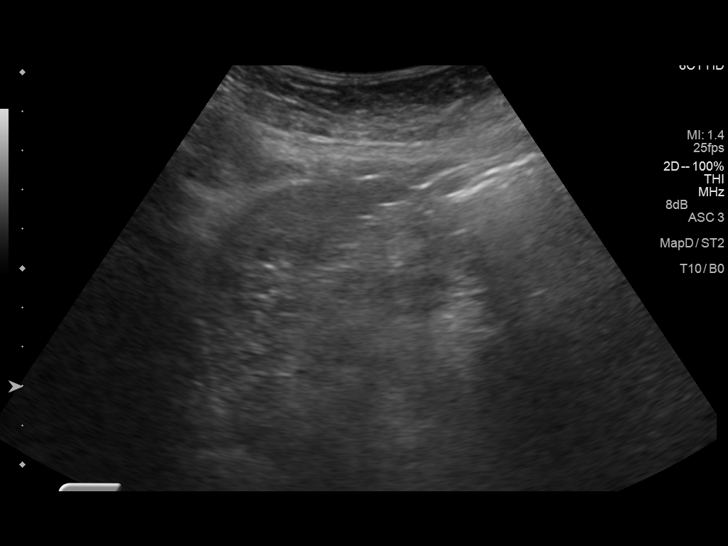
[im 24/29]
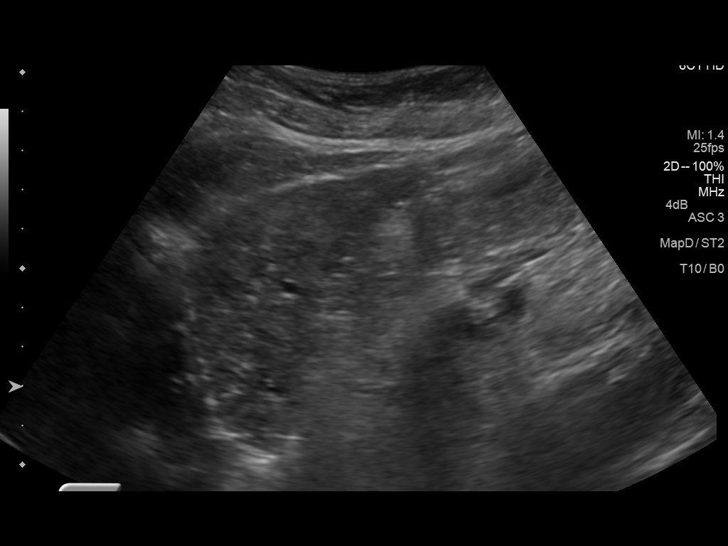
[im 26/29]
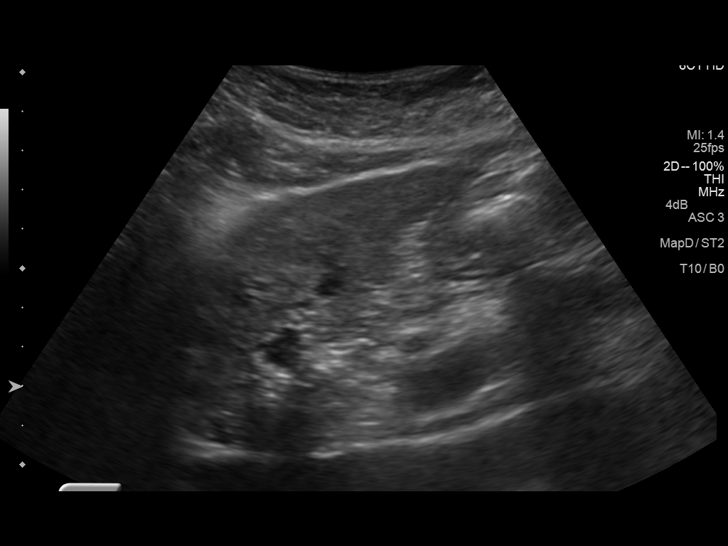
[im 29/29]
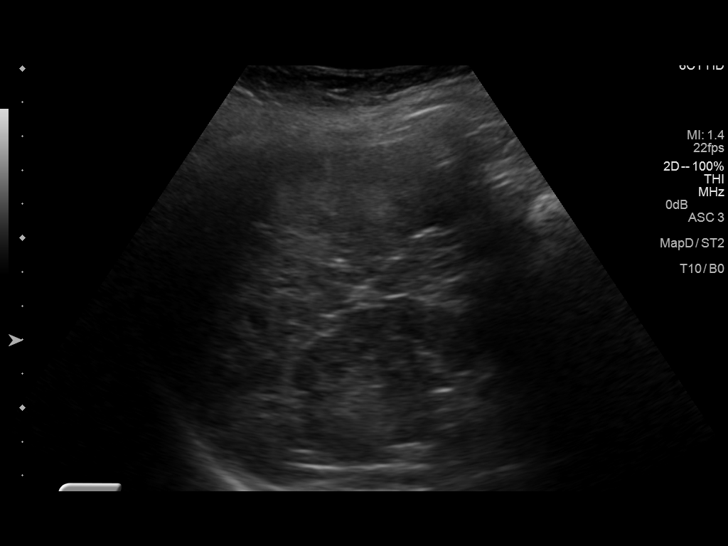

[14 of 25 positions shown; findings below may reference images not displayed]

FINDINGS: Gallbladder:

Surgically removed

Common bile duct:

Diameter: 5.1 mm.

Liver:

Diffusely heterogeneous without focal mass. Some nodularity to the
liver is noted suggestive of underlying cirrhotic change. No
significant ascites is seen.
IMPRESSION: Status post cholecystectomy.

Changes consistent with hepatic cirrhosis.
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
## 2017-05-17 ENCOUNTER — Telehealth: Payer: Self-pay | Admitting: *Deleted

## 2017-05-17 NOTE — Telephone Encounter (Signed)
Patient has moved to [facility] and is in ICU, has questions regarding her past hx and needs MR faxed to [facility] in [place]. Sharyn Lull is faxing records to hospital. I spoke with husband who thought she had cancer and asking if she got chemo. I explained to him that she has a blood disorder of low platelets

## 2017-05-17 NOTE — Telephone Encounter (Signed)
Taken care of. Thanks!

## 2017-08-04 ENCOUNTER — Emergency Department
Admission: EM | Admit: 2017-08-04 | Discharge: 2017-08-04 | Disposition: A | Discharge status: Home / Self Care | Payer: Medicaid Other | Attending: Emergency Medicine | Admitting: Emergency Medicine

## 2017-08-04 DIAGNOSIS — F319 Bipolar disorder, unspecified: Secondary | ICD-10-CM | POA: Diagnosis not present

## 2017-08-04 DIAGNOSIS — M5441 Lumbago with sciatica, right side: Secondary | ICD-10-CM | POA: Insufficient documentation

## 2017-08-04 DIAGNOSIS — Z856 Personal history of leukemia: Secondary | ICD-10-CM | POA: Diagnosis not present

## 2017-08-04 DIAGNOSIS — J45909 Unspecified asthma, uncomplicated: Secondary | ICD-10-CM | POA: Diagnosis not present

## 2017-08-04 DIAGNOSIS — R1031 Right lower quadrant pain: Secondary | ICD-10-CM | POA: Diagnosis present

## 2017-08-04 DIAGNOSIS — G8929 Other chronic pain: Secondary | ICD-10-CM

## 2017-08-04 DIAGNOSIS — Z79899 Other long term (current) drug therapy: Secondary | ICD-10-CM | POA: Diagnosis not present

## 2017-08-04 DIAGNOSIS — F17200 Nicotine dependence, unspecified, uncomplicated: Secondary | ICD-10-CM | POA: Insufficient documentation

## 2017-08-04 DIAGNOSIS — F451 Undifferentiated somatoform disorder: Secondary | ICD-10-CM

## 2017-08-04 DIAGNOSIS — J449 Chronic obstructive pulmonary disease, unspecified: Secondary | ICD-10-CM | POA: Diagnosis not present

## 2017-08-04 DIAGNOSIS — E039 Hypothyroidism, unspecified: Secondary | ICD-10-CM | POA: Diagnosis not present

## 2017-08-04 DIAGNOSIS — N189 Chronic kidney disease, unspecified: Secondary | ICD-10-CM | POA: Diagnosis not present

## 2017-08-04 LAB — CBC
HCT: 31.8 % — ABNORMAL LOW (ref 35.0–47.0)
Hemoglobin: 10.6 g/dL — ABNORMAL LOW (ref 12.0–16.0)
MCH: 26.1 pg (ref 26.0–34.0)
MCHC: 33.3 g/dL (ref 32.0–36.0)
MCV: 78.4 fL — ABNORMAL LOW (ref 80.0–100.0)
PLATELETS: 118 10*3/uL — AB (ref 150–440)
RBC: 4.06 MIL/uL (ref 3.80–5.20)
RDW: 18.4 % — AB (ref 11.5–14.5)
WBC: 5.8 10*3/uL (ref 3.6–11.0)

## 2017-08-04 LAB — COMPREHENSIVE METABOLIC PANEL
ALT: 81 U/L — AB (ref 14–54)
AST: 66 U/L — AB (ref 15–41)
Albumin: 3.3 g/dL — ABNORMAL LOW (ref 3.5–5.0)
Alkaline Phosphatase: 115 U/L (ref 38–126)
Anion gap: 7 (ref 5–15)
BUN: 21 mg/dL — AB (ref 6–20)
CO2: 24 mmol/L (ref 22–32)
Calcium: 8.6 mg/dL — ABNORMAL LOW (ref 8.9–10.3)
Chloride: 109 mmol/L (ref 101–111)
Creatinine, Ser: 0.76 mg/dL (ref 0.44–1.00)
GFR calc Af Amer: >60 mL/min (ref >60)
Glucose, Bld: 98 mg/dL (ref 65–99)
Potassium: 3.2 mmol/L — ABNORMAL LOW (ref 3.5–5.1)
Sodium: 140 mmol/L (ref 135–145)
TOTAL PROTEIN: 6.7 g/dL (ref 6.5–8.1)
Total Bilirubin: 0.7 mg/dL (ref 0.3–1.2)

## 2017-08-04 LAB — LIPASE, BLOOD: Lipase: 41 U/L (ref 11–51)

## 2017-08-04 NOTE — ED Triage Notes (Signed)
Lower abdominal pain X 3 weeks. Chronic lower and mid back pain. Pt states that her doctor recently moved, and does not have any of her oxycodone 15mg .  Pt alert and oriented X4, active, cooperative, pt in NAD. RR even and unlabored, color WNL.

## 2017-08-04 NOTE — ED Notes (Signed)
Per family member, wants psych eval for pt. Reports bilpolar, off meds. MD notified.

## 2017-08-04 NOTE — ED Notes (Signed)
Pt asking for pain prescription, MD notified, no prescription given

## 2017-08-04 NOTE — ED Notes (Signed)
Dr.Clapacs at bedside  

## 2017-08-04 NOTE — Progress Notes (Signed)
LCSW was asked by EDP to meet with patient and provide resources.LCSW Introduced myself and got verbal consent to speak in from of her sister. Patient was clear and calm no pressured speech. She reports she has been on pain medications for several years and has always managed with pain clinic doctors, in fact she is scheduled to see Pain Clinic doctor [facility] in [place] on Thursday November the 9th. She reports she is connected to Dr Wonda Amis at [facility] and reports she we be able to see he him next week no problem.  She will go to DSS on Monday to have her [place] DSI and Medicaid transferred back to [place] she reports she lives with her husband and his Mom. Patient was able to answer all my questions with no pressured speech and was able to answer complex and repeated questions with the same answer 20 minutes later. ( She is definitely wanting Oxi- Pills to help her until she can see her pain clinic doctor.) Told her that may not be possible and she will have to see what the doctors find. This patient I suggest will do well in completing her own plan and getting re-instated to resources in the community and that an in patient hospitalization is not warranted. ( No s/i or H/I no out ward symptoms of a psychosis) and patient is agreeable to take her medications as prescribed.  Explained to her ( sister) who met her at a shelter a year ago that right now its my professional opinion the patient is not having a psychotic or manic episode and has a good plan of care to follow in the next week to get re-instated. This sister does not want her to go back with her husband. After lengthy back and forth between the 2 of them this worker will provide them both with several resources they can access to assist with their own concern. Several resources provided for both patient and sister  Aryannah Mohon LCSW

## 2017-08-04 NOTE — ED Notes (Signed)
Pt calling out in pain. MD notified. No new orders at this time
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
## 2017-08-04 NOTE — Discharge Instructions (Addendum)
Please seek medical attention for any high fevers, chest pain, shortness of breath, change in behavior, persistent vomiting, bloody stool or any other new or concerning symptoms.  

## 2017-08-05 ENCOUNTER — Emergency Department
Admission: EM | Admit: 2017-08-05 | Discharge: 2017-08-05 | Disposition: A | Discharge status: Home / Self Care | Payer: Medicaid Other | Attending: Emergency Medicine | Admitting: Emergency Medicine

## 2017-08-05 ENCOUNTER — Encounter: Payer: Self-pay | Admitting: Emergency Medicine

## 2017-08-05 DIAGNOSIS — Z79899 Other long term (current) drug therapy: Secondary | ICD-10-CM | POA: Insufficient documentation

## 2017-08-05 DIAGNOSIS — G8929 Other chronic pain: Secondary | ICD-10-CM | POA: Diagnosis present

## 2017-08-05 DIAGNOSIS — F17200 Nicotine dependence, unspecified, uncomplicated: Secondary | ICD-10-CM | POA: Insufficient documentation

## 2017-08-05 DIAGNOSIS — J449 Chronic obstructive pulmonary disease, unspecified: Secondary | ICD-10-CM | POA: Insufficient documentation

## 2017-08-05 DIAGNOSIS — Z765 Malingerer [conscious simulation]: Secondary | ICD-10-CM | POA: Diagnosis not present

## 2017-08-05 DIAGNOSIS — E039 Hypothyroidism, unspecified: Secondary | ICD-10-CM | POA: Insufficient documentation

## 2017-08-05 MED ORDER — OXYCODONE-ACETAMINOPHEN 5-325 MG PO TABS
1.0000 | ORAL_TABLET | Freq: Once | ORAL | Status: AC
  Administered 2017-08-05: 1 via ORAL
  Filled 2017-08-05: qty 1

## 2017-08-05 NOTE — ED Notes (Signed)
Pt refused dc vitals, refused to sign that she received dc instructions. Pt refused to comply w/ safety requests. Pt left her room in ED via wheeled stool that was in room. Pt transferred to a wheel chair and taken to lobby.
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
## 2017-08-05 NOTE — Discharge Instructions (Signed)
Please keep your appointment at the pain clinic as scheduled and return to the emergency department for any concerns.  It was a pleasure to take care of you today, and thank you for coming to our emergency department.  If you have any questions or concerns before leaving please ask the nurse to grab me and I'm more than happy to go through your aftercare instructions again.  If you were prescribed any opioid pain medication today such as Norco, Vicodin, Percocet, morphine, hydrocodone, or oxycodone please make sure you do not drive when you are taking this medication as it can alter your ability to drive safely.  If you have any concerns once you are home that you are not improving or are in fact getting worse before you can make it to your follow-up appointment, please do not hesitate to call 911 and come back for further evaluation.  Darel Hong, MD  Results for orders placed or performed during the hospital encounter of 08/04/17  Lipase, blood  Result Value Ref Range   Lipase 41 11 - 51 U/L  Comprehensive metabolic panel  Result Value Ref Range   Sodium 140 135 - 145 mmol/L   Potassium 3.2 (L) 3.5 - 5.1 mmol/L   Chloride 109 101 - 111 mmol/L   CO2 24 22 - 32 mmol/L   Glucose, Bld 98 65 - 99 mg/dL   BUN 21 (H) 6 - 20 mg/dL   Creatinine, Ser 0.76 0.44 - 1.00 mg/dL   Calcium 8.6 (L) 8.9 - 10.3 mg/dL   Total Protein 6.7 6.5 - 8.1 g/dL   Albumin 3.3 (L) 3.5 - 5.0 g/dL   AST 66 (H) 15 - 41 U/L   ALT 81 (H) 14 - 54 U/L   Alkaline Phosphatase 115 38 - 126 U/L   Total Bilirubin 0.7 0.3 - 1.2 mg/dL   GFR calc non Af Amer >60 >60 mL/min   GFR calc Af Amer >60 >60 mL/min   Anion gap 7 5 - 15  CBC  Result Value Ref Range   WBC 5.8 3.6 - 11.0 K/uL   RBC 4.06 3.80 - 5.20 MIL/uL   Hemoglobin 10.6 (L) 12.0 - 16.0 g/dL   HCT 31.8 (L) 35.0 - 47.0 %   MCV 78.4 (L) 80.0 - 100.0 fL   MCH 26.1 26.0 - 34.0 pg   MCHC 33.3 32.0 - 36.0 g/dL   RDW 18.4 (H) 11.5 - 14.5 %   Platelets 118 (L) 150 -  440 K/uL

## 2017-08-05 NOTE — BH Assessment (Signed)
Contacted pt's husband, per pt's request, as pt is sleeping in the ED waiting room. Pt provided her husband's phone number and requested her husband be contacted to come pick her up, as she does not have her cell phone with her. Pt's husband expressed relieved feelings that pt is still at the ED and that she is ok and stated he is attempting to contact someone to come and pick her up but he has not yet been able to find someone.  08/05/17 0915

## 2017-09-18 ENCOUNTER — Ambulatory Visit
Payer: Medicaid Other | Attending: Student in an Organized Health Care Education/Training Program | Admitting: Student in an Organized Health Care Education/Training Program

## 2017-09-18 ENCOUNTER — Encounter: Payer: Self-pay | Admitting: Student in an Organized Health Care Education/Training Program

## 2017-09-18 ENCOUNTER — Other Ambulatory Visit: Payer: Self-pay

## 2017-09-18 VITALS — BP 115/75 | HR 90 | Temp 98.3°F | Resp 18 | Ht 63.0 in | Wt 183.0 lb

## 2017-09-18 DIAGNOSIS — M797 Fibromyalgia: Secondary | ICD-10-CM | POA: Insufficient documentation

## 2017-09-18 DIAGNOSIS — T7411XA Adult physical abuse, confirmed, initial encounter: Secondary | ICD-10-CM | POA: Diagnosis not present

## 2017-09-18 DIAGNOSIS — R161 Splenomegaly, not elsewhere classified: Secondary | ICD-10-CM | POA: Insufficient documentation

## 2017-09-18 DIAGNOSIS — F4310 Post-traumatic stress disorder, unspecified: Secondary | ICD-10-CM | POA: Insufficient documentation

## 2017-09-18 DIAGNOSIS — M5441 Lumbago with sciatica, right side: Secondary | ICD-10-CM

## 2017-09-18 DIAGNOSIS — G8929 Other chronic pain: Secondary | ICD-10-CM

## 2017-09-18 DIAGNOSIS — X58XXXA Exposure to other specified factors, initial encounter: Secondary | ICD-10-CM | POA: Diagnosis not present

## 2017-09-18 DIAGNOSIS — Z87442 Personal history of urinary calculi: Secondary | ICD-10-CM | POA: Diagnosis not present

## 2017-09-18 DIAGNOSIS — M4120 Other idiopathic scoliosis, site unspecified: Secondary | ICD-10-CM | POA: Insufficient documentation

## 2017-09-18 DIAGNOSIS — J309 Allergic rhinitis, unspecified: Secondary | ICD-10-CM | POA: Insufficient documentation

## 2017-09-18 DIAGNOSIS — M545 Low back pain: Secondary | ICD-10-CM | POA: Diagnosis present

## 2017-09-18 DIAGNOSIS — Z87891 Personal history of nicotine dependence: Secondary | ICD-10-CM | POA: Diagnosis not present

## 2017-09-18 DIAGNOSIS — J189 Pneumonia, unspecified organism: Secondary | ICD-10-CM | POA: Diagnosis not present

## 2017-09-18 DIAGNOSIS — E039 Hypothyroidism, unspecified: Secondary | ICD-10-CM | POA: Insufficient documentation

## 2017-09-18 DIAGNOSIS — F1110 Opioid abuse, uncomplicated: Secondary | ICD-10-CM | POA: Diagnosis not present

## 2017-09-18 DIAGNOSIS — M4802 Spinal stenosis, cervical region: Secondary | ICD-10-CM | POA: Insufficient documentation

## 2017-09-18 DIAGNOSIS — K219 Gastro-esophageal reflux disease without esophagitis: Secondary | ICD-10-CM | POA: Insufficient documentation

## 2017-09-18 DIAGNOSIS — G894 Chronic pain syndrome: Secondary | ICD-10-CM

## 2017-09-18 DIAGNOSIS — F4312 Post-traumatic stress disorder, chronic: Secondary | ICD-10-CM | POA: Insufficient documentation

## 2017-09-18 DIAGNOSIS — M419 Scoliosis, unspecified: Secondary | ICD-10-CM | POA: Insufficient documentation

## 2017-09-18 DIAGNOSIS — N189 Chronic kidney disease, unspecified: Secondary | ICD-10-CM | POA: Insufficient documentation

## 2017-09-18 DIAGNOSIS — M329 Systemic lupus erythematosus, unspecified: Secondary | ICD-10-CM | POA: Diagnosis not present

## 2017-09-18 DIAGNOSIS — M5442 Lumbago with sciatica, left side: Secondary | ICD-10-CM | POA: Diagnosis not present

## 2017-09-18 DIAGNOSIS — J4520 Mild intermittent asthma, uncomplicated: Secondary | ICD-10-CM | POA: Diagnosis not present

## 2017-09-18 DIAGNOSIS — L309 Dermatitis, unspecified: Secondary | ICD-10-CM | POA: Diagnosis not present

## 2017-09-18 DIAGNOSIS — K589 Irritable bowel syndrome without diarrhea: Secondary | ICD-10-CM | POA: Insufficient documentation

## 2017-09-18 DIAGNOSIS — K469 Unspecified abdominal hernia without obstruction or gangrene: Secondary | ICD-10-CM | POA: Insufficient documentation

## 2017-09-18 DIAGNOSIS — F988 Other specified behavioral and emotional disorders with onset usually occurring in childhood and adolescence: Secondary | ICD-10-CM | POA: Diagnosis not present

## 2017-09-18 DIAGNOSIS — Z79899 Other long term (current) drug therapy: Secondary | ICD-10-CM | POA: Diagnosis not present

## 2017-09-18 DIAGNOSIS — M069 Rheumatoid arthritis, unspecified: Secondary | ICD-10-CM | POA: Insufficient documentation

## 2017-09-18 DIAGNOSIS — F410 Panic disorder [episodic paroxysmal anxiety] without agoraphobia: Secondary | ICD-10-CM | POA: Insufficient documentation

## 2017-09-18 DIAGNOSIS — M5136 Other intervertebral disc degeneration, lumbar region: Secondary | ICD-10-CM | POA: Diagnosis not present

## 2017-09-18 DIAGNOSIS — D696 Thrombocytopenia, unspecified: Secondary | ICD-10-CM | POA: Diagnosis not present

## 2017-09-18 DIAGNOSIS — M47812 Spondylosis without myelopathy or radiculopathy, cervical region: Secondary | ICD-10-CM | POA: Insufficient documentation

## 2017-09-18 DIAGNOSIS — F319 Bipolar disorder, unspecified: Secondary | ICD-10-CM | POA: Diagnosis not present
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
## 2017-09-18 NOTE — Progress Notes (Signed)
Safety precautions to be maintained throughout the outpatient stay will include: orient to surroundings, keep bed in low position, maintain call bell within reach at all times, provide assistance with transfer out of bed and ambulation.  

## 2017-12-10 ENCOUNTER — Inpatient Hospital Stay
Admission: EM | Admit: 2017-12-10 | Discharge: 2017-12-12 | DRG: 194 | Disposition: A | Discharge status: Home / Self Care | Payer: Medicaid Other | Attending: Internal Medicine | Admitting: Internal Medicine

## 2017-12-10 ENCOUNTER — Other Ambulatory Visit: Payer: Self-pay

## 2017-12-10 ENCOUNTER — Emergency Department: Payer: Medicaid Other

## 2017-12-10 DIAGNOSIS — J441 Chronic obstructive pulmonary disease with (acute) exacerbation: Secondary | ICD-10-CM | POA: Diagnosis present

## 2017-12-10 DIAGNOSIS — Z7951 Long term (current) use of inhaled steroids: Secondary | ICD-10-CM

## 2017-12-10 DIAGNOSIS — K219 Gastro-esophageal reflux disease without esophagitis: Secondary | ICD-10-CM | POA: Diagnosis present

## 2017-12-10 DIAGNOSIS — J4520 Mild intermittent asthma, uncomplicated: Secondary | ICD-10-CM | POA: Diagnosis present

## 2017-12-10 DIAGNOSIS — M797 Fibromyalgia: Secondary | ICD-10-CM | POA: Diagnosis present

## 2017-12-10 DIAGNOSIS — E039 Hypothyroidism, unspecified: Secondary | ICD-10-CM | POA: Diagnosis present

## 2017-12-10 DIAGNOSIS — Z820 Family history of epilepsy and other diseases of the nervous system: Secondary | ICD-10-CM

## 2017-12-10 DIAGNOSIS — Z9851 Tubal ligation status: Secondary | ICD-10-CM

## 2017-12-10 DIAGNOSIS — R0602 Shortness of breath: Secondary | ICD-10-CM

## 2017-12-10 DIAGNOSIS — F319 Bipolar disorder, unspecified: Secondary | ICD-10-CM | POA: Diagnosis present

## 2017-12-10 DIAGNOSIS — F4312 Post-traumatic stress disorder, chronic: Secondary | ICD-10-CM | POA: Diagnosis present

## 2017-12-10 DIAGNOSIS — Z8701 Personal history of pneumonia (recurrent): Secondary | ICD-10-CM

## 2017-12-10 DIAGNOSIS — M4120 Other idiopathic scoliosis, site unspecified: Secondary | ICD-10-CM | POA: Diagnosis present

## 2017-12-10 DIAGNOSIS — F988 Other specified behavioral and emotional disorders with onset usually occurring in childhood and adolescence: Secondary | ICD-10-CM | POA: Diagnosis present

## 2017-12-10 DIAGNOSIS — Z91018 Allergy to other foods: Secondary | ICD-10-CM

## 2017-12-10 DIAGNOSIS — F17210 Nicotine dependence, cigarettes, uncomplicated: Secondary | ICD-10-CM | POA: Diagnosis present

## 2017-12-10 DIAGNOSIS — J189 Pneumonia, unspecified organism: Secondary | ICD-10-CM | POA: Diagnosis present

## 2017-12-10 DIAGNOSIS — K7460 Unspecified cirrhosis of liver: Secondary | ICD-10-CM | POA: Diagnosis present

## 2017-12-10 DIAGNOSIS — Z8614 Personal history of Methicillin resistant Staphylococcus aureus infection: Secondary | ICD-10-CM

## 2017-12-10 DIAGNOSIS — M5136 Other intervertebral disc degeneration, lumbar region: Secondary | ICD-10-CM | POA: Diagnosis present

## 2017-12-10 DIAGNOSIS — Z79899 Other long term (current) drug therapy: Secondary | ICD-10-CM

## 2017-12-10 DIAGNOSIS — E872 Acidosis: Secondary | ICD-10-CM | POA: Diagnosis present

## 2017-12-10 DIAGNOSIS — F4001 Agoraphobia with panic disorder: Secondary | ICD-10-CM | POA: Diagnosis present

## 2017-12-10 DIAGNOSIS — I509 Heart failure, unspecified: Secondary | ICD-10-CM | POA: Diagnosis present

## 2017-12-10 DIAGNOSIS — B181 Chronic viral hepatitis B without delta-agent: Secondary | ICD-10-CM | POA: Diagnosis present

## 2017-12-10 DIAGNOSIS — N183 Chronic kidney disease, stage 3 (moderate): Secondary | ICD-10-CM | POA: Diagnosis present

## 2017-12-10 DIAGNOSIS — Z886 Allergy status to analgesic agent status: Secondary | ICD-10-CM

## 2017-12-10 DIAGNOSIS — Z9049 Acquired absence of other specified parts of digestive tract: Secondary | ICD-10-CM

## 2017-12-10 DIAGNOSIS — G894 Chronic pain syndrome: Secondary | ICD-10-CM | POA: Diagnosis present

## 2017-12-10 DIAGNOSIS — J440 Chronic obstructive pulmonary disease with acute lower respiratory infection: Secondary | ICD-10-CM | POA: Diagnosis present

## 2017-12-10 DIAGNOSIS — B3789 Other sites of candidiasis: Secondary | ICD-10-CM | POA: Diagnosis not present

## 2017-12-10 DIAGNOSIS — J9611 Chronic respiratory failure with hypoxia: Secondary | ICD-10-CM | POA: Diagnosis present

## 2017-12-10 DIAGNOSIS — Z856 Personal history of leukemia: Secondary | ICD-10-CM

## 2017-12-10 DIAGNOSIS — J309 Allergic rhinitis, unspecified: Secondary | ICD-10-CM | POA: Diagnosis present

## 2017-12-10 DIAGNOSIS — Z87442 Personal history of urinary calculi: Secondary | ICD-10-CM

## 2017-12-10 DIAGNOSIS — Z9981 Dependence on supplemental oxygen: Secondary | ICD-10-CM

## 2017-12-10 DIAGNOSIS — I5030 Unspecified diastolic (congestive) heart failure: Secondary | ICD-10-CM | POA: Diagnosis not present

## 2017-12-10 DIAGNOSIS — Z825 Family history of asthma and other chronic lower respiratory diseases: Secondary | ICD-10-CM

## 2017-12-10 DIAGNOSIS — Z888 Allergy status to other drugs, medicaments and biological substances status: Secondary | ICD-10-CM

## 2017-12-10 DIAGNOSIS — Z716 Tobacco abuse counseling: Secondary | ICD-10-CM

## 2017-12-10 DIAGNOSIS — Z885 Allergy status to narcotic agent status: Secondary | ICD-10-CM

## 2017-12-10 DIAGNOSIS — Z882 Allergy status to sulfonamides status: Secondary | ICD-10-CM

## 2017-12-10 DIAGNOSIS — Z8249 Family history of ischemic heart disease and other diseases of the circulatory system: Secondary | ICD-10-CM

## 2017-12-10 LAB — URINALYSIS, COMPLETE (UACMP) WITH MICROSCOPIC
Bilirubin Urine: NEGATIVE
Glucose, UA: NEGATIVE mg/dL
Ketones, ur: NEGATIVE mg/dL
Leukocytes, UA: NEGATIVE
Nitrite: NEGATIVE
PROTEIN: NEGATIVE mg/dL
Specific Gravity, Urine: 1.012 (ref 1.005–1.030)
pH: 6 (ref 5.0–8.0)

## 2017-12-10 LAB — BASIC METABOLIC PANEL
Anion gap: 9 (ref 5–15)
BUN: 13 mg/dL (ref 6–20)
CALCIUM: 8.4 mg/dL — AB (ref 8.9–10.3)
CHLORIDE: 104 mmol/L (ref 101–111)
CO2: 22 mmol/L (ref 22–32)
CREATININE: 0.79 mg/dL (ref 0.44–1.00)
GFR calc non Af Amer: >60 mL/min (ref >60)
GLUCOSE: 99 mg/dL (ref 65–99)
Potassium: 3.9 mmol/L (ref 3.5–5.1)
Sodium: 135 mmol/L (ref 135–145)

## 2017-12-10 LAB — CBC WITH DIFFERENTIAL/PLATELET
Basophils Absolute: 0 10*3/uL (ref 0–0.1)
Basophils Relative: 0 %
EOS ABS: 0.1 10*3/uL (ref 0–0.7)
Eosinophils Relative: 1 %
HCT: 34 % — ABNORMAL LOW (ref 35.0–47.0)
HEMOGLOBIN: 11.4 g/dL — AB (ref 12.0–16.0)
LYMPHS ABS: 0.7 10*3/uL — AB (ref 1.0–3.6)
Lymphocytes Relative: 6 %
MCH: 28.4 pg (ref 26.0–34.0)
MCHC: 33.5 g/dL (ref 32.0–36.0)
MCV: 84.7 fL (ref 80.0–100.0)
Monocytes Absolute: 0.5 10*3/uL (ref 0.2–0.9)
Monocytes Relative: 4 %
NEUTROS PCT: 89 %
Neutro Abs: 10.2 10*3/uL — ABNORMAL HIGH (ref 1.4–6.5)
Platelets: 86 10*3/uL — ABNORMAL LOW (ref 150–440)
RBC: 4.01 MIL/uL (ref 3.80–5.20)
RDW: 20 % — ABNORMAL HIGH (ref 11.5–14.5)
WBC: 11.5 10*3/uL — AB (ref 3.6–11.0)

## 2017-12-10 LAB — TROPONIN I: Troponin I: <0.03 ng/mL (ref <0.03)

## 2017-12-10 LAB — LACTIC ACID, PLASMA
LACTIC ACID, VENOUS: 2.2 mmol/L — AB (ref 0.5–1.9)
Lactic Acid, Venous: 3.8 mmol/L (ref 0.5–1.9)

## 2017-12-10 LAB — INFLUENZA PANEL BY PCR (TYPE A & B)
INFLAPCR: NEGATIVE
Influenza B By PCR: NEGATIVE

## 2017-12-10 LAB — AMMONIA: Ammonia: 23 umol/L (ref 9–35)

## 2017-12-10 MED ORDER — GABAPENTIN 400 MG PO CAPS
400.0000 mg | ORAL_CAPSULE | Freq: Three times a day (TID) | ORAL | Status: DC
  Administered 2017-12-10: 400 mg via ORAL
  Filled 2017-12-10 (×2): qty 1

## 2017-12-10 MED ORDER — IPRATROPIUM-ALBUTEROL 0.5-2.5 (3) MG/3ML IN SOLN
3.0000 mL | RESPIRATORY_TRACT | Status: DC
  Administered 2017-12-10 – 2017-12-12 (×10): 3 mL via RESPIRATORY_TRACT
  Filled 2017-12-10 (×10): qty 3

## 2017-12-10 MED ORDER — LACTULOSE 10 GM/15ML PO SOLN
10.0000 g | Freq: Three times a day (TID) | ORAL | Status: DC
  Administered 2017-12-10: 10 g via ORAL
  Filled 2017-12-10 (×2): qty 30

## 2017-12-10 MED ORDER — QUETIAPINE FUMARATE 200 MG PO TABS
400.0000 mg | ORAL_TABLET | Freq: Every day | ORAL | Status: DC
  Filled 2017-12-10: qty 1

## 2017-12-10 MED ORDER — OLOPATADINE HCL 0.1 % OP SOLN
1.0000 [drp] | Freq: Two times a day (BID) | OPHTHALMIC | Status: DC
  Administered 2017-12-10 – 2017-12-12 (×4): 1 [drp] via OPHTHALMIC
  Filled 2017-12-10: qty 5

## 2017-12-10 MED ORDER — MORPHINE SULFATE (PF) 2 MG/ML IV SOLN
1.0000 mg | INTRAVENOUS | Status: DC | PRN
  Administered 2017-12-11: 1 mg via INTRAVENOUS
  Filled 2017-12-10: qty 1

## 2017-12-10 MED ORDER — LUBIPROSTONE 24 MCG PO CAPS
24.0000 ug | ORAL_CAPSULE | Freq: Every day | ORAL | Status: DC
  Administered 2017-12-11 – 2017-12-12 (×2): 24 ug via ORAL
  Filled 2017-12-10 (×2): qty 1

## 2017-12-10 MED ORDER — METHYLPREDNISOLONE SODIUM SUCC 125 MG IJ SOLR
60.0000 mg | Freq: Four times a day (QID) | INTRAMUSCULAR | Status: DC
  Administered 2017-12-10 – 2017-12-11 (×4): 60 mg via INTRAVENOUS
  Filled 2017-12-10 (×4): qty 2

## 2017-12-10 MED ORDER — ONDANSETRON HCL 4 MG PO TABS: 4.0000 mg | ORAL_TABLET | Freq: Four times a day (QID) | ORAL | Status: DC | PRN

## 2017-12-10 MED ORDER — POTASSIUM CHLORIDE CRYS ER 20 MEQ PO TBCR
40.0000 meq | EXTENDED_RELEASE_TABLET | Freq: Once | ORAL | Status: AC
  Administered 2017-12-10: 40 meq via ORAL
  Filled 2017-12-10: qty 2

## 2017-12-10 MED ORDER — HYDROXYZINE PAMOATE 50 MG PO CAPS
50.0000 mg | ORAL_CAPSULE | Freq: Four times a day (QID) | ORAL | Status: DC | PRN
  Filled 2017-12-10: qty 1

## 2017-12-10 MED ORDER — CYCLOBENZAPRINE HCL 10 MG PO TABS: 10.0000 mg | ORAL_TABLET | Freq: Three times a day (TID) | ORAL | Status: DC | PRN

## 2017-12-10 MED ORDER — MOMETASONE FURO-FORMOTEROL FUM 200-5 MCG/ACT IN AERO
2.0000 | INHALATION_SPRAY | Freq: Two times a day (BID) | RESPIRATORY_TRACT | Status: DC
  Administered 2017-12-10 – 2017-12-12 (×4): 2 via RESPIRATORY_TRACT
  Filled 2017-12-10: qty 8.8

## 2017-12-10 MED ORDER — FUROSEMIDE 40 MG PO TABS
40.0000 mg | ORAL_TABLET | Freq: Every day | ORAL | Status: DC
  Administered 2017-12-10 – 2017-12-12 (×3): 40 mg via ORAL
  Filled 2017-12-10 (×3): qty 1

## 2017-12-10 MED ORDER — ROPINIROLE HCL 1 MG PO TABS
2.0000 mg | ORAL_TABLET | Freq: Three times a day (TID) | ORAL | Status: DC
  Administered 2017-12-10 – 2017-12-12 (×6): 2 mg via ORAL
  Filled 2017-12-10 (×6): qty 2

## 2017-12-10 MED ORDER — METHOCARBAMOL 750 MG PO TABS
750.0000 mg | ORAL_TABLET | Freq: Two times a day (BID) | ORAL | Status: DC | PRN
  Filled 2017-12-10: qty 1

## 2017-12-10 MED ORDER — ACETAMINOPHEN 500 MG PO TABS
ORAL_TABLET | ORAL | Status: AC
  Filled 2017-12-10: qty 2

## 2017-12-10 MED ORDER — ONDANSETRON HCL 4 MG/2ML IJ SOLN
4.0000 mg | Freq: Four times a day (QID) | INTRAMUSCULAR | Status: DC | PRN
  Administered 2017-12-11: 4 mg via INTRAVENOUS
  Filled 2017-12-10: qty 2

## 2017-12-10 MED ORDER — SODIUM CHLORIDE 0.9% FLUSH
3.0000 mL | Freq: Two times a day (BID) | INTRAVENOUS | Status: DC
  Administered 2017-12-10 – 2017-12-12 (×4): 3 mL via INTRAVENOUS

## 2017-12-10 MED ORDER — GUAIFENESIN ER 600 MG PO TB12
600.0000 mg | ORAL_TABLET | Freq: Two times a day (BID) | ORAL | Status: DC
  Administered 2017-12-10 – 2017-12-12 (×4): 600 mg via ORAL
  Filled 2017-12-10 (×4): qty 1

## 2017-12-10 MED ORDER — SODIUM CHLORIDE 0.9% FLUSH: 3.0000 mL | INTRAVENOUS | Status: DC | PRN

## 2017-12-10 MED ORDER — SODIUM CHLORIDE 0.9 % IV SOLN
500.0000 mg | Freq: Every day | INTRAVENOUS | Status: DC
  Administered 2017-12-11: 500 mg via INTRAVENOUS
  Filled 2017-12-10 (×2): qty 500

## 2017-12-10 MED ORDER — SODIUM CHLORIDE 0.9 % IV SOLN
1.0000 g | Freq: Once | INTRAVENOUS | Status: AC
  Administered 2017-12-10: 1 g via INTRAVENOUS
  Filled 2017-12-10: qty 10

## 2017-12-10 MED ORDER — FUROSEMIDE 10 MG/ML IJ SOLN
40.0000 mg | Freq: Once | INTRAMUSCULAR | Status: AC
  Administered 2017-12-10: 40 mg via INTRAVENOUS
  Filled 2017-12-10: qty 4

## 2017-12-10 MED ORDER — SODIUM CHLORIDE 0.9 % IV SOLN: 500.0000 mg | INTRAVENOUS | Status: DC

## 2017-12-10 MED ORDER — PROMETHAZINE HCL 25 MG PO TABS: 25.0000 mg | ORAL_TABLET | Freq: Four times a day (QID) | ORAL | Status: DC | PRN

## 2017-12-10 MED ORDER — VITAMIN D (ERGOCALCIFEROL) 1.25 MG (50000 UNIT) PO CAPS
50000.0000 [IU] | ORAL_CAPSULE | ORAL | Status: DC
  Administered 2017-12-11: 50000 [IU] via ORAL
  Filled 2017-12-10: qty 1

## 2017-12-10 MED ORDER — SODIUM CHLORIDE 0.9 % IV SOLN
1.0000 g | Freq: Every day | INTRAVENOUS | Status: DC
  Administered 2017-12-11: 1 g via INTRAVENOUS
  Filled 2017-12-10 (×2): qty 10

## 2017-12-10 MED ORDER — PANTOPRAZOLE SODIUM 40 MG PO TBEC
40.0000 mg | DELAYED_RELEASE_TABLET | Freq: Every day | ORAL | Status: DC
  Administered 2017-12-10 – 2017-12-12 (×3): 40 mg via ORAL
  Filled 2017-12-10 (×3): qty 1

## 2017-12-10 MED ORDER — SODIUM CHLORIDE 0.9 % IV SOLN
500.0000 mg | Freq: Once | INTRAVENOUS | Status: AC
  Administered 2017-12-10: 500 mg via INTRAVENOUS
  Filled 2017-12-10: qty 500

## 2017-12-10 MED ORDER — AMPHETAMINE-DEXTROAMPHET ER 5 MG PO CP24
30.0000 mg | ORAL_CAPSULE | Freq: Every day | ORAL | Status: DC
  Administered 2017-12-10: 30 mg via ORAL
  Filled 2017-12-10: qty 6

## 2017-12-10 MED ORDER — TIOTROPIUM BROMIDE MONOHYDRATE 18 MCG IN CAPS
18.0000 ug | ORAL_CAPSULE | RESPIRATORY_TRACT | Status: DC
  Administered 2017-12-11 – 2017-12-12 (×2): 18 ug via RESPIRATORY_TRACT
  Filled 2017-12-10: qty 5

## 2017-12-10 MED ORDER — ENOXAPARIN SODIUM 40 MG/0.4ML ~~LOC~~ SOLN
40.0000 mg | SUBCUTANEOUS | Status: DC
  Administered 2017-12-10 – 2017-12-11 (×2): 40 mg via SUBCUTANEOUS
  Filled 2017-12-10 (×2): qty 0.4

## 2017-12-10 MED ORDER — ACETAMINOPHEN 325 MG PO TABS
650.0000 mg | ORAL_TABLET | Freq: Four times a day (QID) | ORAL | Status: DC | PRN
  Administered 2017-12-10: 650 mg via ORAL
  Filled 2017-12-10: qty 2

## 2017-12-10 MED ORDER — SODIUM CHLORIDE 0.9 % IV SOLN: 250.0000 mL | INTRAVENOUS | Status: DC | PRN

## 2017-12-10 MED ORDER — CITALOPRAM HYDROBROMIDE 20 MG PO TABS
10.0000 mg | ORAL_TABLET | Freq: Every day | ORAL | Status: DC
  Administered 2017-12-10 – 2017-12-12 (×3): 10 mg via ORAL
  Filled 2017-12-10 (×3): qty 1

## 2017-12-10 MED ORDER — OXYCODONE HCL ER 10 MG PO T12A
10.0000 mg | EXTENDED_RELEASE_TABLET | Freq: Two times a day (BID) | ORAL | Status: DC
  Administered 2017-12-10 – 2017-12-12 (×4): 10 mg via ORAL
  Filled 2017-12-10 (×4): qty 1

## 2017-12-10 MED ORDER — FLUTICASONE PROPIONATE 50 MCG/ACT NA SUSP
2.0000 | Freq: Every day | NASAL | Status: DC
  Administered 2017-12-10 – 2017-12-12 (×3): 2 via NASAL
  Filled 2017-12-10: qty 16

## 2017-12-10 MED ORDER — LORATADINE 10 MG PO TABS
10.0000 mg | ORAL_TABLET | Freq: Every day | ORAL | Status: DC
  Administered 2017-12-10 – 2017-12-12 (×3): 10 mg via ORAL
  Filled 2017-12-10 (×3): qty 1

## 2017-12-10 MED ORDER — ACETAMINOPHEN 650 MG RE SUPP: 650.0000 mg | Freq: Four times a day (QID) | RECTAL | Status: DC | PRN

## 2017-12-10 NOTE — ED Triage Notes (Addendum)
Pt comes into the ED via EMS from home with c/o increased SOB for the past 4 days. Pt is on 2L [place] continuous. Pt is in NAD on arrival. Pt states her PCP changed her medications for anxiety and has been having more issues with panic attacks recently.
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
## 2017-12-10 NOTE — ED Notes (Signed)
Pt states she has gotten more short of breath and anxious over the past week, she requests to be put back on xanax instead of the adderall she was given

## 2017-12-10 NOTE — ED Notes (Addendum)
Pt given gingerale, friend at bedside

## 2017-12-10 NOTE — Progress Notes (Signed)
Primary nurse was made aware that pt Lactic Acid was 3.8 after reviewing labs. Primary nurse paged and spoke to Dr. Jannifer Franklin. Orders received for STAT Lactic acid. Primary nurse to continue to monitor

## 2017-12-10 NOTE — ED Notes (Signed)
Dr. Patel in to see pt.

## 2017-12-10 NOTE — ED Notes (Signed)
Phyllis on 2 C called and updated on lactic acid result, Dr. Posey Pronto paged as well.

## 2017-12-10 NOTE — H&P (Addendum)
[facility] [place] NAME: [name]    MR#:  [id number]  DATE OF BIRTH:  12-Feb-1971  DATE OF ADMISSION:  12/10/2017  PRIMARY CARE PHYSICIAN: Lorelee Market, MD   REQUESTING/REFERRING PHYSICIAN: Nance Pear MD  CHIEF COMPLAINT:   Chief Complaint  Patient presents with  . Shortness of Breath    HISTORY OF PRESENT ILLNESS: [name]  is a 47 y.o. female with a known history of COPD, bipolar 1 disorder, chronic kidney disease, cirrhosis of the liver, GERD, hypothyroidism presenting with complaint of shortness of breath and fever.  Patient is a very poor historian states that she has been having these symptoms for the last few days and gotten worse.  She is supposed to be on oxygen chronically at home as well.  She she complains of having high-grade fevers.  Also complains of nausea vomiting but no diarrhea. PAST MEDICAL HISTORY:   Past Medical History:  Diagnosis Date  . Agoraphobia with panic disorder   . Asthma   . Bipolar 1 disorder ([name])   . Cancer ([place])    leukemia  . Chronic kidney disease    h/o kidney stones  . Cirrhosis of liver ([place])    recently diagnosed (12-2015)  . COPD (chronic obstructive pulmonary disease) ([place])   . Depression   . Fibromyalgia   . GERD (gastroesophageal reflux disease)   . Heart murmur   . Hepatitis B    recently dx per pt (12-14-15) but Epic states chronic Hep B  . Hx MRSA infection    LAST HAD 2015  . Hypothyroidism   . Leg fracture   . Other abdominal hernia    disk disease  . Pneumonia 2016  . PTSD (post-traumatic stress disorder)   . Scoliosis     PAST SURGICAL HISTORY:  Past Surgical History:  Procedure Laterality Date  . APPENDECTOMY  1999  . CHOLECYSTECTOMY  1999  . ESOPHAGOGASTRODUODENOSCOPY (EGD) WITH PROPOFOL N/A 03/30/2016   Procedure: ESOPHAGOGASTRODUODENOSCOPY (EGD) WITH PROPOFOL;  Surgeon: Manya Silvas, MD;  Location[facility] ENDOSCOPY;  Service: Endoscopy;   Laterality: N/A;  . FRACTURE SURGERY     leg  . HERNIA REPAIR    . OTHER SURGICAL HISTORY  07/2014   left tib fib repair  . OTHER SURGICAL HISTORY  07/2014   right tib fib repair  . TUBAL LIGATION  1999  . VENTRAL HERNIA REPAIR N/A 12/17/2015   Procedure: HERNIA REPAIR VENTRAL ADULT;  Surgeon: Christene Lye, MD;  Location: [facility] ORS;  Service: General;  Laterality: N/A;    SOCIAL HISTORY:  Social History   Tobacco Use  . Smoking status: Former Smoker    Packs/day: 0.50    Years: 12.00    Pack years: 6.00    Last attempt to quit: 09/16/2016    Years since quitting: 1.2  . Smokeless tobacco: Never Used  Substance Use Topics  . Alcohol use: No    Alcohol/week: 0.0 oz    FAMILY HISTORY:  Family History  Problem Relation Age of Onset  . COPD Mother   . Heart disease Mother   . Parkinson's disease Father     DRUG ALLERGIES:  Allergies  Allergen Reactions  . Sulfa Antibiotics Shortness Of Breath  . Acetaminophen   . Coconut Oil Swelling  . Nsaids Other (See Comments)    restless  . Other     Steroids, causes hallunications  . Tramadol Anxiety    REVIEW OF SYSTEMS:  CONSTITUTIONAL: No fever, fatigue or weakness.  EYES: No blurred or double vision.  EARS, NOSE, AND THROAT: No tinnitus or ear pain.  RESPIRATORY: Positive cough, positive shortness of breath, wheezing or hemoptysis.  CARDIOVASCULAR: No chest pain, orthopnea, edema.  GASTROINTESTINAL: No nausea, vomiting, diarrhea or abdominal pain.  GENITOURINARY: No dysuria, hematuria.  ENDOCRINE: No polyuria, nocturia,  HEMATOLOGY: No anemia, easy bruising or bleeding SKIN: No rash or lesion. MUSCULOSKELETAL: No joint pain or arthritis.   NEUROLOGIC: No tingling, numbness, weakness.  PSYCHIATRY: No anxiety or depression.   MEDICATIONS AT HOME:  Prior to Admission medications   Medication Sig Start Date End Date Taking? Authorizing Provider  ADDERALL XR 30 MG 24 hr capsule Take 30 mg by mouth daily.  12/03/17  Yes [provider]  ADVAIR DISKUS 250-50 MCG/DOSE AEPB Inhale 1 puff into the lungs every morning.  09/18/15  Yes [provider]  AMITIZA 24 MCG capsule Take 24 mcg by mouth daily with breakfast.  09/21/15  Yes [provider]  cetirizine (ZYRTEC) 10 MG tablet Take 10 mg by mouth daily.  03/12/15  Yes [provider]  citalopram (CELEXA) 10 MG tablet Take 10 mg by mouth daily.  09/11/17  Yes [provider]  cyclobenzaprine (FLEXERIL) 10 MG tablet Take 10 mg by mouth 3 (three) times daily as needed.  03/16/15  Yes [provider]  EPINEPHrine (EPIPEN 2-PAK) 0.3 mg/0.3 mL IJ SOAJ injection Inject 0.3 mg into the muscle as needed.  11/19/14  Yes [provider]  furosemide (LASIX) 40 MG tablet Take 40 mg by mouth daily.  03/12/15  Yes [provider]  gabapentin (NEURONTIN) 400 MG capsule Take 400 mg by mouth 3 (three) times daily.  03/12/15  Yes [provider]  hydrOXYzine (VISTARIL) 50 MG capsule Take 1 capsule by mouth every 6 (six) hours as needed.  03/12/15  Yes [provider]  ketoconazole (NIZORAL) 2 % cream Apply 1 application topically 2 (two) times daily. 03/12/15  Yes [provider]  methocarbamol (ROBAXIN) 750 MG tablet Take 750 mg by mouth 2 (two) times daily as needed for muscle spasms.  09/11/17  Yes [provider]  mometasone (NASONEX) 50 MCG/ACT nasal spray Place 2 sprays into the nose daily. 03/12/15  Yes [provider]  Olopatadine HCl (PATADAY) 0.2 % SOLN Place 1 drop into both eyes daily. 10/15/14  Yes [provider]  omeprazole (PRILOSEC) 20 MG capsule Take 20 mg by mouth daily. 11/02/17  Yes [provider]  potassium chloride (K-DUR) 10 MEQ tablet Take 2 tablets (20 mEq total) by mouth daily. 02/27/16  Yes Loney Hering, MD  PROAIR HFA 108 (90 BASE) MCG/ACT inhaler Inhale 2 puffs into the lungs every 4 (four) hours as needed.  03/12/15  Yes  [provider]  promethazine (PHENERGAN) 25 MG tablet Take 1 tablet (25 mg total) by mouth every 6 (six) hours as needed for nausea or vomiting. 12/29/15  Yes Sankar, Seeplaputhur G, MD  QUEtiapine (SEROQUEL) 200 MG tablet Take 400 mg by mouth at bedtime.    Yes [provider]  rOPINIRole (REQUIP) 2 MG tablet Take 1 tablet by mouth 3 (three) times daily.  09/17/15  Yes [provider]  SPIRIVA HANDIHALER 18 MCG inhalation capsule Place 18 mcg into inhaler and inhale every morning.  09/18/15  Yes [provider]  Vitamin D, Ergocalciferol, (DRISDOL) 50000 UNITS CAPS capsule Take 50,000 Units by mouth once a week. 03/12/15  Yes [provider]  XTAMPZA ER 9 MG C12A Take 9 mg by mouth every 12 (twelve) hours. 11/08/17  Yes [provider]  lactulose (CHRONULAC) 10 GM/15ML solution Take 15 mLs (10 g total) by mouth 3 (three) times daily. Patient not taking: Reported on 09/18/2017 02/27/16   Loney Hering, MD  polyethylene glycol powder (GLYCOLAX/MIRALAX) powder Dissolve one heaping tablespoon in 4-8 ounces of juice or water. Dink this mixture twice daily until having one solid bowel movement daily (then only take it once a day). Patient not taking: Reported on 09/18/2017 11/08/15   Joanne Gavel, MD  promethazine (PHENERGAN) 25 MG suppository Place 1 suppository (25 mg total) rectally every 6 (six) hours as needed for nausea or vomiting. Patient not taking: Reported on 12/10/2017 02/10/16   Gregor Hams, MD      PHYSICAL EXAMINATION:   VITAL SIGNS: Blood pressure (!) 131/100, pulse (!) 110, temperature (!) 102.3 F (39.1 C), temperature source Oral, resp. rate 20, height 5\' 3"  (1.6 m), weight 204 lb (92.5 kg), last menstrual period 12/08/2017, SpO2 96 %.  GENERAL:  47 y.o.-year-old patient lying in the bed with no acute distress.  EYES: Pupils equal, round, reactive to light and accommodation. No scleral icterus. Extraocular muscles intact.   HEENT: Head atraumatic, normocephalic. Oropharynx and nasopharynx clear.  NECK:  Supple, no jugular venous distention. No thyroid enlargement, no tenderness.  LUNGS: Bilateral wheezing, through both lungs no accessory muscle usage CARDIOVASCULAR: S1, S2 normal. No murmurs, rubs, or gallops.  ABDOMEN: Soft, nontender, nondistended. Bowel sounds present. No organomegaly or mass.  EXTREMITIES: No pedal edema, cyanosis, or clubbing.  NEUROLOGIC: Cranial nerves II through XII are intact. Muscle strength 5/5 in all extremities. Sensation intact. Gait not checked.  PSYCHIATRIC: The patient is alert and oriented x 3.  SKIN: No obvious rash, lesion, or ulcer.   LABORATORY PANEL:   CBC Recent Labs  Lab 12/10/17 1330  WBC 11.5*  HGB 11.4*  HCT 34.0*  PLT 86*  MCV 84.7  MCH 28.4  MCHC 33.5  RDW 20.0*  LYMPHSABS 0.7*  MONOABS 0.5  EOSABS 0.1  BASOSABS 0.0   ------------------------------------------------------------------------------------------------------------------  Chemistries  Recent Labs  Lab 12/10/17 1257  NA 135  K 3.9  CL 104  CO2 22  GLUCOSE 99  BUN 13  CREATININE 0.79  CALCIUM 8.4*   ------------------------------------------------------------------------------------------------------------------ estimated creatinine clearance is 93.9 mL/min (by C-G formula based on SCr of 0.79 mg/dL). ------------------------------------------------------------------------------------------------------------------ No results for input(s): TSH, T4TOTAL, T3FREE, THYROIDAB in the last 72 hours.  Invalid input(s): FREET3   Coagulation profile No results for input(s): INR, PROTIME in the last 168 hours. ------------------------------------------------------------------------------------------------------------------- No results for input(s): DDIMER in the last 72  hours. -------------------------------------------------------------------------------------------------------------------  Cardiac Enzymes Recent Labs  Lab 12/10/17 1257  TROPONINI <0.03   ------------------------------------------------------------------------------------------------------------------ Invalid input(s): POCBNP  ---------------------------------------------------------------------------------------------------------------  Urinalysis    Component Value Date/Time   COLORURINE YELLOW (A) 12/10/2017 1401   APPEARANCEUR HAZY (A) 12/10/2017 1401   APPEARANCEUR Clear 09/05/2014 2156   LABSPEC 1.012 12/10/2017 1401   LABSPEC 1.008 09/05/2014 2156   PHURINE 6.0 12/10/2017 1401   GLUCOSEU NEGATIVE 12/10/2017 1401   GLUCOSEU Negative 09/05/2014 2156   HGBUR LARGE (A) 12/10/2017 1401   BILIRUBINUR NEGATIVE 12/10/2017 1401   BILIRUBINUR Negative 09/05/2014 2156   KETONESUR NEGATIVE 12/10/2017 1401   PROTEINUR NEGATIVE 12/10/2017 1401   UROBILINOGEN 1.0 10/30/2009 2124   NITRITE NEGATIVE 12/10/2017 1401   LEUKOCYTESUR NEGATIVE 12/10/2017 1401   LEUKOCYTESUR Negative 09/05/2014 2156  RADIOLOGY: Dg Chest 2 View  Result Date: 12/10/2017 CLINICAL DATA:  Shortness of Breath EXAM: CHEST - 2 VIEW COMPARISON:  02/27/2016 FINDINGS: Low lung volumes. Heart is borderline in size. Interstitial and patchy alveolar opacities bilaterally, left greater than right could reflect asymmetric edema or infection. No effusions or acute bony abnormality. IMPRESSION: Borderline heart size. Interstitial and patchy alveolar opacities bilaterally, left greater than right could reflect asymmetric edema or infection. Electronically Signed   By: Rolm Baptise M.D.   On: 12/10/2017 10:51    EKG: Orders placed or performed during the hospital encounter of 12/10/17  . ED EKG  . ED EKG    IMPRESSION AND PLAN: Patient is 47 year old presenting with  shortness of breath  1.  Community-acquired  pneumonia Continue ceftriaxone and azithromycin which has been started in the emergency room Follow blood culture  2.  COPD with exasperation we will treat with nebulizer steroids  3.  CHF continue Lasix  4.  Chronic pain continue her home regimen  5.  Cirrhosis on chronic lactulose check ammonia level  6.  GERD continue PPI  7.  Miscellaneous Lovenox for DVT prophylaxis  8. Nicotine abuse smoking cessation provided 4 min spent, pt dosen't want nicotine replacment  All the records are reviewed and case discussed with ED provider. Management plans discussed with the patient, family and they are in agreement.  CODE STATUS: Code Status History    Date Active Date Inactive Code Status Order ID Comments User Context   03/24/2015 13:35 03/26/2015 19:26 Full Code [id number]  Theodoro Grist, MD Inpatient       TOTAL TIME TAKING CARE OF THIS PATIENT: 55 minutes.    Dustin Flock M.D on 12/10/2017 at 3:19 PM  Between 7am to 6pm - Pager - [phone]  After 6pm go to [email or web address] - password [email or web address] [facility]  [phone]  CC: Primary care physician; Lorelee Market, MD

## 2017-12-10 NOTE — ED Notes (Signed)
Lab called to assist with collecting blood cultures due to pt being difficult stick.

## 2017-12-10 NOTE — ED Notes (Signed)
Pt out of bed to commode and did become short of breath, it resolved when back on stretcher at rest.

## 2017-12-11 LAB — BASIC METABOLIC PANEL
ANION GAP: 11 (ref 5–15)
BUN: 17 mg/dL (ref 6–20)
CALCIUM: 8 mg/dL — AB (ref 8.9–10.3)
CO2: 21 mmol/L — ABNORMAL LOW (ref 22–32)
Chloride: 102 mmol/L (ref 101–111)
Creatinine, Ser: 1.02 mg/dL — ABNORMAL HIGH (ref 0.44–1.00)
GFR calc Af Amer: >60 mL/min (ref >60)
GLUCOSE: 216 mg/dL — AB (ref 65–99)
Potassium: 3.3 mmol/L — ABNORMAL LOW (ref 3.5–5.1)
SODIUM: 134 mmol/L — AB (ref 135–145)

## 2017-12-11 LAB — CBC
HCT: 33 % — ABNORMAL LOW (ref 35.0–47.0)
Hemoglobin: 11 g/dL — ABNORMAL LOW (ref 12.0–16.0)
MCH: 28.5 pg (ref 26.0–34.0)
MCHC: 33.5 g/dL (ref 32.0–36.0)
MCV: 85.3 fL (ref 80.0–100.0)
PLATELETS: 76 10*3/uL — AB (ref 150–440)
RBC: 3.87 MIL/uL (ref 3.80–5.20)
RDW: 20.1 % — ABNORMAL HIGH (ref 11.5–14.5)
WBC: 8.3 10*3/uL (ref 3.6–11.0)

## 2017-12-11 LAB — GLUCOSE, CAPILLARY: GLUCOSE-CAPILLARY: 276 mg/dL — AB (ref 65–99)

## 2017-12-11 LAB — LACTIC ACID, PLASMA
LACTIC ACID, VENOUS: 3.3 mmol/L — AB (ref 0.5–1.9)
LACTIC ACID, VENOUS: 5.2 mmol/L — AB (ref 0.5–1.9)
Lactic Acid, Venous: 2.4 mmol/L (ref 0.5–1.9)
Lactic Acid, Venous: 4.1 mmol/L (ref 0.5–1.9)
Lactic Acid, Venous: 4.4 mmol/L (ref 0.5–1.9)
Lactic Acid, Venous: 4.5 mmol/L (ref 0.5–1.9)

## 2017-12-11 LAB — BRAIN NATRIURETIC PEPTIDE: B NATRIURETIC PEPTIDE 5: 91 pg/mL (ref 0.0–100.0)

## 2017-12-11 MED ORDER — INSULIN ASPART 100 UNIT/ML ~~LOC~~ SOLN
0.0000 [IU] | Freq: Three times a day (TID) | SUBCUTANEOUS | Status: DC
  Administered 2017-12-12 (×2): 2 [IU] via SUBCUTANEOUS
  Filled 2017-12-11 (×3): qty 1

## 2017-12-11 MED ORDER — METHYLPREDNISOLONE SODIUM SUCC 40 MG IJ SOLR
40.0000 mg | Freq: Two times a day (BID) | INTRAMUSCULAR | Status: DC
  Administered 2017-12-11 – 2017-12-12 (×2): 40 mg via INTRAVENOUS
  Filled 2017-12-11 (×2): qty 1

## 2017-12-11 MED ORDER — AZITHROMYCIN 500 MG PO TABS: 500.0000 mg | ORAL_TABLET | Freq: Every day | ORAL | 0 refills | Status: AC

## 2017-12-11 MED ORDER — HYDROXYZINE HCL 25 MG PO TABS
50.0000 mg | ORAL_TABLET | Freq: Four times a day (QID) | ORAL | Status: DC | PRN
  Administered 2017-12-11 – 2017-12-12 (×2): 50 mg via ORAL
  Filled 2017-12-11: qty 2

## 2017-12-11 MED ORDER — NYSTATIN 100000 UNIT/ML MT SUSP
5.0000 mL | Freq: Four times a day (QID) | OROMUCOSAL | Status: DC
  Administered 2017-12-11 – 2017-12-12 (×5): 500000 [IU] via OROMUCOSAL
  Filled 2017-12-11 (×6): qty 5

## 2017-12-11 MED ORDER — DIPHENHYDRAMINE HCL 25 MG PO CAPS
25.0000 mg | ORAL_CAPSULE | Freq: Four times a day (QID) | ORAL | Status: DC | PRN
  Administered 2017-12-11: 25 mg via ORAL
  Filled 2017-12-11: qty 1

## 2017-12-11 MED ORDER — SODIUM CHLORIDE 0.9 % IV SOLN
INTRAVENOUS | Status: DC
  Administered 2017-12-11 – 2017-12-12 (×2): via INTRAVENOUS

## 2017-12-11 MED ORDER — NYSTATIN 100000 UNIT/ML MT SUSP: 5.0000 mL | Freq: Four times a day (QID) | OROMUCOSAL | 0 refills | Status: AC

## 2017-12-11 MED ORDER — QUETIAPINE FUMARATE 200 MG PO TABS
400.0000 mg | ORAL_TABLET | Freq: Every day | ORAL | Status: DC
  Administered 2017-12-11: 400 mg via ORAL
  Filled 2017-12-11: qty 2
  Filled 2017-12-11: qty 1

## 2017-12-11 MED ORDER — CEFUROXIME AXETIL 500 MG PO TABS: 500.0000 mg | ORAL_TABLET | Freq: Two times a day (BID) | ORAL | 0 refills | Status: AC

## 2017-12-11 MED ORDER — LACTULOSE 10 GM/15ML PO SOLN: 10.0000 g | Freq: Three times a day (TID) | ORAL | 0 refills | Status: AC

## 2017-12-11 MED ORDER — SODIUM CHLORIDE 0.9 % IV BOLUS (SEPSIS)
1000.0000 mL | Freq: Once | INTRAVENOUS | Status: AC
  Administered 2017-12-11: 1000 mL via INTRAVENOUS

## 2017-12-11 MED ORDER — DIPHENHYDRAMINE HCL 50 MG/ML IJ SOLN
25.0000 mg | Freq: Once | INTRAMUSCULAR | Status: AC
  Administered 2017-12-11: 25 mg via INTRAVENOUS
  Filled 2017-12-11: qty 1

## 2017-12-11 NOTE — Discharge Summary (Addendum)
[facility] [place] NAME: [name]    MR#:  [id number]  DATE OF BIRTH:  04/19/1971  DATE OF ADMISSION:  12/10/2017 ADMITTING PHYSICIAN: Dustin Flock, MD  DATE OF DISCHARGE: 12/12/2017  PRIMARY CARE PHYSICIAN: Lorelee Market, MD    ADMISSION DIAGNOSIS:  Shortness of breath [R06.02] Community acquired pneumonia, unspecified laterality [J18.9]  DISCHARGE DIAGNOSIS:  Active Problems:   PNA (pneumonia)   SECONDARY DIAGNOSIS:   Past Medical History:  Diagnosis Date  . Agoraphobia with panic disorder   . Asthma   . Bipolar 1 disorder ([place])   . Cancer ([place])    leukemia  . Chronic kidney disease    h/o kidney stones  . Cirrhosis of liver ([place])    recently diagnosed (12-2015)  . COPD (chronic obstructive pulmonary disease) ([place])   . Depression   . Fibromyalgia   . GERD (gastroesophageal reflux disease)   . Heart murmur   . Hepatitis B    recently dx per pt (12-14-15) but Epic states chronic Hep B  . Hx MRSA infection    LAST HAD 2015  . Hypothyroidism   . Leg fracture   . Other abdominal hernia    disk disease  . Pneumonia 2016  . PTSD (post-traumatic stress disorder)   . Scoliosis     HOSPITAL COURSE:  47 year old female with history of chronic hypoxic respiratory failure on 2 L of oxygen due to COPD and liver cirrhosis who presents with shortness of breath.   1. Community-acquired pneumonia with chronic hypoxic respiratory failure: Patient symptoms seem to have improved. She will be discharged on oral Ceftin and azithromycin. She will continue on 2 L of oxygen. I ordered an ECHO and the results are pending. She will follow up with Cardiology regarding the results. She thinks she may have a diagnosis of CHF in the past but is unsure. I do not have any records to confirm or not confirm this.   2. Mild COPD exacerbation: Patient had no wheezing upon discharge. She does not need prednisone at discharge.  3.  Chronic pain syndrome: Patient will continue his outpatient regimen  4. Liver cirrhosis: Patient will continue lactulose  5. Tobacco dependence: Patient is encouraged to quit smoking. Counseling was provided for 4 minutes.  DISCHARGE CONDITIONS AND DIET:   Stable for discharge on heart healthy diet  CONSULTS OBTAINED:    DRUG ALLERGIES:   Allergies  Allergen Reactions  . Sulfa Antibiotics Shortness Of Breath  . Acetaminophen   . Coconut Oil Swelling  . Nsaids Other (See Comments)    restless  . Other     Steroids, causes hallunications  . Tramadol Anxiety    DISCHARGE MEDICATIONS:   Allergies as of 12/12/2017      Reactions   Sulfa Antibiotics Shortness Of Breath   Acetaminophen    Coconut Oil Swelling   Nsaids Other (See Comments)   restless   Other    Steroids, causes hallunications   Tramadol Anxiety      Medication List    STOP taking these medications   polyethylene glycol powder powder Commonly known as:  GLYCOLAX/MIRALAX     TAKE these medications   ADDERALL XR 30 MG 24 hr capsule Generic drug:  amphetamine-dextroamphetamine Take 30 mg by mouth daily.   ADVAIR DISKUS 250-50 MCG/DOSE Aepb Generic drug:  Fluticasone-Salmeterol Inhale 1 puff into the lungs every morning.   AMITIZA 24 MCG capsule Generic drug:  lubiprostone Take 24 mcg by  mouth daily with breakfast.   azithromycin 500 MG tablet Commonly known as:  ZITHROMAX Take 1 tablet (500 mg total) by mouth daily for 5 days. Take 1 tablet daily for 5 days.   cefUROXime 500 MG tablet Commonly known as:  CEFTIN Take 1 tablet (500 mg total) by mouth 2 (two) times daily with a meal for 5 days.   cetirizine 10 MG tablet Commonly known as:  ZYRTEC Take 10 mg by mouth daily.   citalopram 10 MG tablet Commonly known as:  CELEXA Take 10 mg by mouth daily.   cyclobenzaprine 10 MG tablet Commonly known as:  FLEXERIL Take 10 mg by mouth 3 (three) times daily as needed.   EPIPEN 2-PAK 0.3  mg/0.3 mL Soaj injection Generic drug:  EPINEPHrine Inject 0.3 mg into the muscle as needed.   furosemide 40 MG tablet Commonly known as:  LASIX Take 40 mg by mouth daily.   gabapentin 400 MG capsule Commonly known as:  NEURONTIN Take 400 mg by mouth 3 (three) times daily.   hydrOXYzine 50 MG capsule Commonly known as:  VISTARIL Take 1 capsule by mouth every 6 (six) hours as needed.   ketoconazole 2 % cream Commonly known as:  NIZORAL Apply 1 application topically 2 (two) times daily.   lactulose 10 GM/15ML solution Commonly known as:  CHRONULAC Take 15 mLs (10 g total) by mouth 3 (three) times daily.   methocarbamol 750 MG tablet Commonly known as:  ROBAXIN Take 750 mg by mouth 2 (two) times daily as needed for muscle spasms.   mometasone 50 MCG/ACT nasal spray Commonly known as:  NASONEX Place 2 sprays into the nose daily.   nystatin 100000 UNIT/ML suspension Commonly known as:  MYCOSTATIN Use as directed 5 mLs (500,000 Units total) in the mouth or throat 4 (four) times daily.   omeprazole 20 MG capsule Commonly known as:  PRILOSEC Take 20 mg by mouth daily.   PATADAY 0.2 % Soln Generic drug:  Olopatadine HCl Place 1 drop into both eyes daily.   potassium chloride 10 MEQ tablet Commonly known as:  K-DUR Take 2 tablets (20 mEq total) by mouth daily.   PROAIR HFA 108 (90 Base) MCG/ACT inhaler Generic drug:  albuterol Inhale 2 puffs into the lungs every 4 (four) hours as needed.   promethazine 25 MG tablet Commonly known as:  PHENERGAN Take 1 tablet (25 mg total) by mouth every 6 (six) hours as needed for nausea or vomiting. What changed:  Another medication with the same name was removed. Continue taking this medication, and follow the directions you see here.   rOPINIRole 2 MG tablet Commonly known as:  REQUIP Take 1 tablet by mouth 3 (three) times daily.   SEROQUEL 200 MG tablet Generic drug:  QUEtiapine Take 400 mg by mouth at bedtime.   SPIRIVA  HANDIHALER 18 MCG inhalation capsule Generic drug:  tiotropium Place 18 mcg into inhaler and inhale every morning.   Vitamin D (Ergocalciferol) 50000 units Caps capsule Commonly known as:  DRISDOL Take 50,000 Units by mouth once a week.   XTAMPZA ER 9 MG C12a Generic drug:  oxyCODONE ER Take 9 mg by mouth every 12 (twelve) hours.            Durable Medical Equipment  (From admission, onward)        Start     Ordered   12/11/17 0000  For home use only DME oxygen    Question Answer Comment  Mode or (Route)  Nasal cannula   Liters per Minute 2   Frequency Continuous (stationary and portable oxygen unit needed)   Oxygen conserving device Yes   Oxygen delivery system Gas      12/11/17 1151        Today   CHIEF COMPLAINT:  And doing well this morning. She does have thrush.   VITAL SIGNS:  Blood pressure 115/74, pulse 87, temperature (!) 97.4 F (36.3 C), temperature source Oral, resp. rate (!) 22, height 5\' 3"  (1.6 m), weight 92.5 kg (204 lb), last menstrual period 12/08/2017, SpO2 91 %.   REVIEW OF SYSTEMS:  Review of Systems  Constitutional: Negative.  Negative for chills, fever and malaise/fatigue.  HENT: Negative.  Negative for ear discharge, ear pain, hearing loss, nosebleeds and sore throat.   Eyes: Negative.  Negative for blurred vision and pain.  Respiratory: Negative.  Negative for cough, hemoptysis, shortness of breath and wheezing.   Cardiovascular: Negative.  Negative for chest pain, palpitations and leg swelling.  Gastrointestinal: Negative.  Negative for abdominal pain, blood in stool, diarrhea, nausea and vomiting.  Genitourinary: Negative.  Negative for dysuria.  Musculoskeletal: Negative.  Negative for back pain.  Skin: Negative.   Neurological: Negative for dizziness, tremors, speech change, focal weakness, seizures and headaches.  Endo/Heme/Allergies: Negative.  Does not bruise/bleed easily.  Psychiatric/Behavioral: Negative.  Negative for  depression, hallucinations and suicidal ideas.     PHYSICAL EXAMINATION:  GENERAL:  47 y.o.-year-old patient lying in the bed with no acute distress.  THRUSH NECK:  Supple, no jugular venous distention. No thyroid enlargement, no tenderness.  LUNGS: Normal breath sounds bilaterally, no wheezing, rales,rhonchi  No use of accessory muscles of respiration.  CARDIOVASCULAR: S1, S2 normal. No murmurs, rubs, or gallops.  ABDOMEN: Soft, non-tender, non-distended. Bowel sounds present. No organomegaly or mass.  EXTREMITIES: No pedal edema, cyanosis, or clubbing.  PSYCHIATRIC: The patient is alert and oriented x 3.  SKIN: No obvious rash, lesion, or ulcer.   DATA REVIEW:   CBC Recent Labs  Lab 12/11/17 0628  WBC 8.3  HGB 11.0*  HCT 33.0*  PLT 76*    Chemistries  Recent Labs  Lab 12/11/17 0628  NA 134*  K 3.3*  CL 102  CO2 21*  GLUCOSE 216*  BUN 17  CREATININE 1.02*  CALCIUM 8.0*    Cardiac Enzymes Recent Labs  Lab 12/10/17 1257  [place] <0.03    Microbiology Results  @[email or web address] @  RADIOLOGY:  Dg Chest 1 View  Result Date: 12/12/2017 CLINICAL DATA:  Congestive heart failure. EXAM: CHEST 1 VIEW COMPARISON:  Radiographs of December 10, 2017. FINDINGS: Stable cardiomediastinal silhouette. No pneumothorax or pleural effusion is noted. Mildly improved diffuse interstitial densities are noted suggesting improving edema or inflammation. Bony thorax is unremarkable. IMPRESSION: Mildly improved diffuse interstitial densities are noted suggesting improving edema or infection. Electronically Signed   By: Marijo Conception, M.D.   On: 12/12/2017 09:09   Dg Chest 2 View  Result Date: 12/10/2017 CLINICAL DATA:  Shortness of Breath EXAM: CHEST - 2 VIEW COMPARISON:  02/27/2016 FINDINGS: Low lung volumes. Heart is borderline in size. Interstitial and patchy alveolar opacities bilaterally, left greater than right could reflect asymmetric edema or infection. No effusions or acute bony  abnormality. IMPRESSION: Borderline heart size. Interstitial and patchy alveolar opacities bilaterally, left greater than right could reflect asymmetric edema or infection. Electronically Signed   By: Rolm Baptise M.D.   On: 12/10/2017 10:51      Allergies as of 12/12/2017  Reactions   Sulfa Antibiotics Shortness Of Breath   Acetaminophen    Coconut Oil Swelling   Nsaids Other (See Comments)   restless   Other    Steroids, causes hallunications   Tramadol Anxiety      Medication List    STOP taking these medications   polyethylene glycol powder powder Commonly known as:  GLYCOLAX/MIRALAX     TAKE these medications   ADDERALL XR 30 MG 24 hr capsule Generic drug:  amphetamine-dextroamphetamine Take 30 mg by mouth daily.   ADVAIR DISKUS 250-50 MCG/DOSE Aepb Generic drug:  Fluticasone-Salmeterol Inhale 1 puff into the lungs every morning.   AMITIZA 24 MCG capsule Generic drug:  lubiprostone Take 24 mcg by mouth daily with breakfast.   azithromycin 500 MG tablet Commonly known as:  ZITHROMAX Take 1 tablet (500 mg total) by mouth daily for 5 days. Take 1 tablet daily for 5 days.   cefUROXime 500 MG tablet Commonly known as:  CEFTIN Take 1 tablet (500 mg total) by mouth 2 (two) times daily with a meal for 5 days.   cetirizine 10 MG tablet Commonly known as:  ZYRTEC Take 10 mg by mouth daily.   citalopram 10 MG tablet Commonly known as:  CELEXA Take 10 mg by mouth daily.   cyclobenzaprine 10 MG tablet Commonly known as:  FLEXERIL Take 10 mg by mouth 3 (three) times daily as needed.   EPIPEN 2-PAK 0.3 mg/0.3 mL Soaj injection Generic drug:  EPINEPHrine Inject 0.3 mg into the muscle as needed.   furosemide 40 MG tablet Commonly known as:  LASIX Take 40 mg by mouth daily.   gabapentin 400 MG capsule Commonly known as:  NEURONTIN Take 400 mg by mouth 3 (three) times daily.   hydrOXYzine 50 MG capsule Commonly known as:  VISTARIL Take 1 capsule by mouth  every 6 (six) hours as needed.   ketoconazole 2 % cream Commonly known as:  NIZORAL Apply 1 application topically 2 (two) times daily.   lactulose 10 GM/15ML solution Commonly known as:  CHRONULAC Take 15 mLs (10 g total) by mouth 3 (three) times daily.   methocarbamol 750 MG tablet Commonly known as:  ROBAXIN Take 750 mg by mouth 2 (two) times daily as needed for muscle spasms.   mometasone 50 MCG/ACT nasal spray Commonly known as:  NASONEX Place 2 sprays into the nose daily.   nystatin 100000 UNIT/ML suspension Commonly known as:  MYCOSTATIN Use as directed 5 mLs (500,000 Units total) in the mouth or throat 4 (four) times daily.   omeprazole 20 MG capsule Commonly known as:  PRILOSEC Take 20 mg by mouth daily.   PATADAY 0.2 % Soln Generic drug:  Olopatadine HCl Place 1 drop into both eyes daily.   potassium chloride 10 MEQ tablet Commonly known as:  K-DUR Take 2 tablets (20 mEq total) by mouth daily.   PROAIR HFA 108 (90 Base) MCG/ACT inhaler Generic drug:  albuterol Inhale 2 puffs into the lungs every 4 (four) hours as needed.   promethazine 25 MG tablet Commonly known as:  PHENERGAN Take 1 tablet (25 mg total) by mouth every 6 (six) hours as needed for nausea or vomiting. What changed:  Another medication with the same name was removed. Continue taking this medication, and follow the directions you see here.   rOPINIRole 2 MG tablet Commonly known as:  REQUIP Take 1 tablet by mouth 3 (three) times daily.   SEROQUEL 200 MG tablet Generic drug:  QUEtiapine Take  400 mg by mouth at bedtime.   SPIRIVA HANDIHALER 18 MCG inhalation capsule Generic drug:  tiotropium Place 18 mcg into inhaler and inhale every morning.   Vitamin D (Ergocalciferol) 50000 units Caps capsule Commonly known as:  DRISDOL Take 50,000 Units by mouth once a week.   XTAMPZA ER 9 MG C12a Generic drug:  oxyCODONE ER Take 9 mg by mouth every 12 (twelve) hours.            Durable  Medical Equipment  (From admission, onward)        Start     Ordered   12/11/17 0000  For home use only DME oxygen    Question Answer Comment  Mode or (Route) Nasal cannula   Liters per Minute 2   Frequency Continuous (stationary and portable oxygen unit needed)   Oxygen conserving device Yes   Oxygen delivery system Gas      12/11/17 1151          Management plans discussed with the patient and she is in agreement. Stable for discharge home  Patient should follow up with pcp  CODE STATUS:     Code Status Orders  (From admission, onward)        Start     Ordered   12/10/17 1637  Full code  Continuous     12/10/17 1636    Code Status History    Date Active Date Inactive Code Status Order ID Comments User Context   03/24/2015 13:35 03/26/2015 19:26 Full Code [id number]  Theodoro Grist, MD Inpatient      TOTAL TIME TAKING CARE OF THIS PATIENT: 38 minutes.    Note: This dictation was prepared with Dragon dictation along with smaller phrase technology. Any transcriptional errors that result from this process are unintentional.  Demetrius Mahler M.D on 12/12/2017 at 10:17 AM  Between 7am to 6pm - Pager - [phone] After 6pm go to [email or web address] - password [email or web address] [facility]  Office  [phone]  CC: Primary care physician; Lorelee Market, MD

## 2017-12-11 NOTE — Progress Notes (Signed)
[facility] [place] NAME: [name]    MR#:  [id number]  DATE OF BIRTH:  September 17, 1971  SUBJECTIVE:   Doing well   REVIEW OF SYSTEMS:    Review of Systems  Constitutional: Negative for fever, chills weight loss HENT: Negative for ear pain, nosebleeds, congestion, facial swelling, rhinorrhea, neck pain, neck stiffness and ear discharge.   Respiratory: Negative for cough, shortness of breath, wheezing  Cardiovascular: Negative for chest pain, palpitations and leg swelling.  Gastrointestinal: Negative for heartburn, abdominal pain, vomiting, diarrhea or consitpation Genitourinary: Negative for dysuria, urgency, frequency, hematuria Musculoskeletal: Negative for back pain or joint pain Neurological: Negative for dizziness, seizures, syncope, focal weakness,  numbness and headaches.  Hematological: Does not bruise/bleed easily.  Psychiatric/Behavioral: Negative for hallucinations, confusion, dysphoric mood    Tolerating Diet: yes      DRUG ALLERGIES:   Allergies  Allergen Reactions  . Sulfa Antibiotics Shortness Of Breath  . Acetaminophen   . Coconut Oil Swelling  . Nsaids Other (See Comments)    restless  . Other     Steroids, causes hallunications  . Tramadol Anxiety    VITALS:  Blood pressure 105/72, pulse 91, temperature 97.7 F (36.5 C), temperature source Oral, resp. rate 20, height 5\' 3"  (1.6 m), weight 92.5 kg (204 lb), last menstrual period 12/08/2017, SpO2 93 %.  PHYSICAL EXAMINATION:  Constitutional: Appears well-developed and well-nourished. No distress. HENT: Normocephalic thrush Eyes: Conjunctivae and EOM are normal. PERRLA, no scleral icterus.  Neck: Normal ROM. Neck supple. No JVD. No tracheal deviation. CVS: RRR, S1/S2 +, no murmurs, no gallops, no carotid bruit.  Pulmonary: Effort and breath sounds normal, no stridor, rhonchi, wheezes, rales.  Abdominal: Soft. BS +,  no distension, tenderness, rebound  or guarding.  Musculoskeletal: Normal range of motion. No edema and no tenderness.  Neuro: Alert. CN 2-12 grossly intact. No focal deficits. Skin: Skin is warm and dry. No rash noted. Psychiatric: Normal mood and affect.      LABORATORY PANEL:   CBC Recent Labs  Lab 12/11/17 0628  WBC 8.3  HGB 11.0*  HCT 33.0*  PLT 76*   ------------------------------------------------------------------------------------------------------------------  Chemistries  Recent Labs  Lab 12/11/17 0628  NA 134*  K 3.3*  CL 102  CO2 21*  GLUCOSE 216*  BUN 17  CREATININE 1.02*  CALCIUM 8.0*   ------------------------------------------------------------------------------------------------------------------  Cardiac Enzymes Recent Labs  Lab 12/10/17 1257  TROPONINI <0.03   ------------------------------------------------------------------------------------------------------------------  RADIOLOGY:  Dg Chest 2 View  Result Date: 12/10/2017 CLINICAL DATA:  Shortness of Breath EXAM: CHEST - 2 VIEW COMPARISON:  02/27/2016 FINDINGS: Low lung volumes. Heart is borderline in size. Interstitial and patchy alveolar opacities bilaterally, left greater than right could reflect asymmetric edema or infection. No effusions or acute bony abnormality. IMPRESSION: Borderline heart size. Interstitial and patchy alveolar opacities bilaterally, left greater than right could reflect asymmetric edema or infection. Electronically Signed   By: Rolm Baptise M.D.   On: 12/10/2017 10:51     ASSESSMENT AND PLAN:   47 year old female with history of chronic hypoxic respiratory failure on 2 L of oxygen due to COPD and liver cirrhosis who presents with shortness of breath.   1. Community-acquired pneumonia with chronic hypoxic respiratory failure: Patient symptoms seem to have improved. Continue rocephin and azithro Lactic acid still up.right  Not quite sure why lactic acid is elevated as patient does not look  ill.    2. Mild COPD exacerbation: Wean steroids Continue  inhalers .  3. Chronic pain syndrome: Patient will continue outpatient regimen  4. Liver cirrhosis: Patient will continue lactulose   5. Thrush: Nystatin      Management plans discussed with the patient and she is in agreement.  CODE STATUS: full  TOTAL TIME TAKING CARE OF THIS PATIENT: 30 minutes.     POSSIBLE D/C today pending lactic acid level,   Earl Losee M.D on 12/11/2017 at 11:56 AM  Between 7am to 6pm - Pager - [phone] After 6pm go to [email or web address] - password [email or web address] [facility]  Office  [phone]  CC: Primary care physician; Lorelee Market, MD  Note: This dictation was prepared with Dragon dictation along with smaller phrase technology. Any transcriptional errors that result from this process are unintentional.

## 2017-12-11 NOTE — Progress Notes (Signed)
Patient complained of having allergic reaction ie itching and swollen of throat. assessment showed no swelling in the throat. Dr. Jannifer Franklin notified and benadryl 25mg  IV ordered. Will continue to monitor.

## 2017-12-11 NOTE — Progress Notes (Signed)
SATURATION QUALIFICATIONS: (This note is used to comply with regulatory documentation for home oxygen)  Patient Saturations on Room Air at Rest = 85%  Patient Saturations on Room Air while Ambulating =80 %  Patient Saturations on 2 Liters of oxygen while Ambulating = 93%  Please briefly explain why patient needs home oxygen: Pt was at 85 % O2 sats while sitting after removing nasal cannula for 3 minutes. After walking 90 ft she was 80% so I sat her down and got her O2 sats at 93 and walked her 90 ft more without oxygen and it went to 80% again so I took her back to her room and put her back on oxygen.

## 2017-12-11 NOTE — Progress Notes (Signed)
Notified Dr. Jannifer Franklin of patient lactic acid of 5.2. New orders put in at this this time. Will continue to monitor.

## 2017-12-11 NOTE — Progress Notes (Signed)
Primary nurse notified Dr. Jannifer Franklin of Lactic Acid of 2.4 on Recheck. Orders received to recheck Lactic Acid on AM labs. Primary nurse to continue to monitor.

## 2017-12-11 NOTE — Progress Notes (Signed)
Inpatient Diabetes Program Recommendations  AACE/ADA: New Consensus Statement on Inpatient Glycemic Control (2015)  Target Ranges:  Prepandial:   less than 140 mg/dL      Peak postprandial:   less than 180 mg/dL (1-2 hours)      Critically ill patients:  140 - 180 mg/dL   Results for [name], [name] (MRN [id number]) as of 12/11/2017 07:27  Ref. Range 12/10/2017 12:57 12/11/2017 06:28  Glucose Latest Ref Range: 65 - 99 mg/dL 99 216 (H)   Review of Glycemic Control  Diabetes history: No Outpatient Diabetes medications: NA Current orders for Inpatient glycemic control: None  Inpatient Diabetes Program Recommendations: Correction (SSI): While inpatient and ordered steroids, please consider ordering CBGs with Novolog correction scale ACHS.  Thanks, Barnie Alderman, RN, MSN, CDE Diabetes Coordinator Inpatient Diabetes Program [phone] (Team Pager from 8am to 5pm)

## 2017-12-12 ENCOUNTER — Inpatient Hospital Stay: Payer: Medicaid Other

## 2017-12-12 ENCOUNTER — Inpatient Hospital Stay (HOSPITAL_COMMUNITY)
Admit: 2017-12-12 | Discharge: 2017-12-12 | Disposition: A | Discharge status: Home / Self Care | Payer: Medicaid Other | Attending: Internal Medicine | Admitting: Internal Medicine

## 2017-12-12 DIAGNOSIS — I5030 Unspecified diastolic (congestive) heart failure: Secondary | ICD-10-CM

## 2017-12-12 LAB — ECHOCARDIOGRAM COMPLETE
Height: 63 in
Weight: 3264 oz

## 2017-12-12 LAB — GLUCOSE, CAPILLARY
GLUCOSE-CAPILLARY: 152 mg/dL — AB (ref 65–99)
Glucose-Capillary: 153 mg/dL — ABNORMAL HIGH (ref 65–99)

## 2017-12-12 LAB — LACTIC ACID, PLASMA: LACTIC ACID, VENOUS: 4.7 mmol/L — AB (ref 0.5–1.9)

## 2017-12-12 LAB — HIV ANTIBODY (ROUTINE TESTING W REFLEX): HIV SCREEN 4TH GENERATION: NONREACTIVE

## 2017-12-12 NOTE — Progress Notes (Signed)
Notified Dr. Marcille Blanco of patient lactic acid of 4.7 New order put in at this time. Will continue to monitor.

## 2017-12-12 NOTE — Care Management (Signed)
Patient admitted for PNA and COPD.  Patient to discharge home today.   Lives at home with husband.  PCP niemeyer.  Patient states that she has a concentrator at home(she got while in [place]), and a nebulizer.  Patient qualifies for continuous home O2. [facility] will provide home O2 at discharge.  Jason with [facility] to deliver portable O2 tank at discharge. Patient independent at baseline.  No home health needs identified.  RNCM signing off.

## 2017-12-12 NOTE — Progress Notes (Signed)
Resumed 2l after neb treatment 

## 2017-12-12 NOTE — Progress Notes (Signed)
*  PRELIMINARY RESULTS* Echocardiogram 2D Echocardiogram has been performed.  [name] [name] 12/12/2017, 9:55 AM

## 2017-12-15 LAB — CULTURE, BLOOD (ROUTINE X 2)
Culture: NO GROWTH
Culture: NO GROWTH
SPECIAL REQUESTS: ADEQUATE

## 2017-12-16 NOTE — Progress Notes (Deleted)
Cardiology Office Note  Date:  12/16/2017   ID:  [name], DOB 1971/04/01, MRN [id number]  PCP:  Lorelee Market, MD   No chief complaint on file.   HPI:    Bipolar/PTSD Cirrhosis Copd Depression/fibromyalgia  In the hospital for PNA Echo ordered,  Suggested she follow up with cardiology for echo results  Echo 12/12/2017 Left ventricle: The cavity size was at the upper limits of   normal. Wall thickness was normal. Systolic function was   vigorous. The estimated ejection fraction was in the range of 65%   to 70%. Doppler parameters are consistent with abnormal left   ventricular relaxation (grade 1 diastolic dysfunction). - Right ventricle: The cavity size was normal. Wall thickness was   normal. Systolic function was normal.   PMH:   has a past medical history of Agoraphobia with panic disorder, Asthma, Bipolar 1 disorder ([place]), Cancer ([place]), Chronic kidney disease, Cirrhosis of liver ([place]), COPD (chronic obstructive pulmonary disease) ([place]), Depression, Fibromyalgia, GERD (gastroesophageal reflux disease), Heart murmur, Hepatitis B, MRSA infection, Hypothyroidism, Leg fracture, Other abdominal hernia, Pneumonia (2016), PTSD (post-traumatic stress disorder), and Scoliosis.  PSH:    Past Surgical History:  Procedure Laterality Date  . APPENDECTOMY  1999  . CHOLECYSTECTOMY  1999  . ESOPHAGOGASTRODUODENOSCOPY (EGD) WITH PROPOFOL N/A 03/30/2016   Procedure: ESOPHAGOGASTRODUODENOSCOPY (EGD) WITH PROPOFOL;  Surgeon: Manya Silvas, MD;  Location: [facility] ENDOSCOPY;  Service: Endoscopy;  Laterality: N/A;  . FRACTURE SURGERY     leg  . HERNIA REPAIR    . OTHER SURGICAL HISTORY  07/2014   left tib fib repair  . OTHER SURGICAL HISTORY  07/2014   right tib fib repair  . TUBAL LIGATION  1999  . VENTRAL HERNIA REPAIR N/A 12/17/2015   Procedure: HERNIA REPAIR VENTRAL ADULT;  Surgeon: Christene Lye, MD;  Location: [facility] ORS;  Service: General;  Laterality: N/A;     Current Outpatient Medications  Medication Sig Dispense Refill  . ADDERALL XR 30 MG 24 hr capsule Take 30 mg by mouth daily.  0  . ADVAIR DISKUS 250-50 MCG/DOSE AEPB Inhale 1 puff into the lungs every morning.   0  . AMITIZA 24 MCG capsule Take 24 mcg by mouth daily with breakfast.   0  . azithromycin (ZITHROMAX) 500 MG tablet Take 1 tablet (500 mg total) by mouth daily for 5 days. Take 1 tablet daily for 5 days. 5 tablet 0  . cefUROXime (CEFTIN) 500 MG tablet Take 1 tablet (500 mg total) by mouth 2 (two) times daily with a meal for 5 days. 10 tablet 0  . cetirizine (ZYRTEC) 10 MG tablet Take 10 mg by mouth daily.   0  . citalopram (CELEXA) 10 MG tablet Take 10 mg by mouth daily.   0  . cyclobenzaprine (FLEXERIL) 10 MG tablet Take 10 mg by mouth 3 (three) times daily as needed.   0  . EPINEPHrine (EPIPEN 2-PAK) 0.3 mg/0.3 mL IJ SOAJ injection Inject 0.3 mg into the muscle as needed.     . furosemide (LASIX) 40 MG tablet Take 40 mg by mouth daily.   0  . gabapentin (NEURONTIN) 400 MG capsule Take 400 mg by mouth 3 (three) times daily.   0  . hydrOXYzine (VISTARIL) 50 MG capsule Take 1 capsule by mouth every 6 (six) hours as needed.   0  . ketoconazole (NIZORAL) 2 % cream Apply 1 application topically 2 (two) times daily.  0  . lactulose (CHRONULAC) 10 GM/15ML solution  Take 15 mLs (10 g total) by mouth 3 (three) times daily. 240 mL 0  . methocarbamol (ROBAXIN) 750 MG tablet Take 750 mg by mouth 2 (two) times daily as needed for muscle spasms.   0  . mometasone (NASONEX) 50 MCG/ACT nasal spray Place 2 sprays into the nose daily.  0  . nystatin (MYCOSTATIN) 100000 UNIT/ML suspension Use as directed 5 mLs (500,000 Units total) in the mouth or throat 4 (four) times daily. 60 mL 0  . Olopatadine HCl (PATADAY) 0.2 % SOLN Place 1 drop into both eyes daily.    Marland Kitchen omeprazole (PRILOSEC) 20 MG capsule Take 20 mg by mouth daily.  0  . potassium chloride (K-DUR) 10 MEQ tablet Take 2 tablets (20 mEq total)  by mouth daily. 6 tablet 0  . PROAIR HFA 108 (90 BASE) MCG/ACT inhaler Inhale 2 puffs into the lungs every 4 (four) hours as needed.   0  . promethazine (PHENERGAN) 25 MG tablet Take 1 tablet (25 mg total) by mouth every 6 (six) hours as needed for nausea or vomiting. 10 tablet 0  . QUEtiapine (SEROQUEL) 200 MG tablet Take 400 mg by mouth at bedtime.     Marland Kitchen rOPINIRole (REQUIP) 2 MG tablet Take 1 tablet by mouth 3 (three) times daily.   0  . SPIRIVA HANDIHALER 18 MCG inhalation capsule Place 18 mcg into inhaler and inhale every morning.   0  . Vitamin D, Ergocalciferol, (DRISDOL) 50000 UNITS CAPS capsule Take 50,000 Units by mouth once a week.  0  . XTAMPZA ER 9 MG C12A Take 9 mg by mouth every 12 (twelve) hours.  0   No current facility-administered medications for this visit.      Allergies:   Sulfa antibiotics; Acetaminophen; Coconut oil; Nsaids; Other; and Tramadol   Social History:  The patient  reports that she quit smoking about 15 months ago. She has a 6.00 pack-year smoking history. she has never used smokeless tobacco. She reports that she uses drugs. Drug: Cocaine. She reports that she does not drink alcohol.   Family History:   family history includes COPD in her mother; Heart disease in her mother; Parkinson's disease in her father.    Review of Systems: ROS   PHYSICAL EXAM: VS:  LMP 12/08/2017 (Exact Date)  , BMI There is no height or weight on file to calculate BMI. GEN: Well nourished, well developed, in no acute distress HEENT: normal Neck: no JVD, carotid bruits, or masses Cardiac: RRR; no murmurs, rubs, or gallops,no edema  Respiratory:  clear to auscultation bilaterally, normal work of breathing GI: soft, nontender, nondistended, + BS MS: no deformity or atrophy Skin: warm and dry, no rash Neuro:  Strength and sensation are intact Psych: euthymic mood, full affect    Recent Labs: 08/04/2017: ALT 81 12/11/2017: B Natriuretic Peptide 91.0; BUN 17; Creatinine,  Ser 1.02; Hemoglobin 11.0; Platelets 76; Potassium 3.3; Sodium 134    Lipid Panel No results found for: CHOL, HDL, LDLCALC, TRIG    Wt Readings from Last 3 Encounters:  12/10/17 204 lb (92.5 kg)  09/18/17 183 lb (83 kg)  08/05/17 195 lb (88.5 kg)       ASSESSMENT AND PLAN:  No diagnosis found.   Disposition:   F/U  6 months  No orders of the defined types were placed in this encounter.    Signed, Esmond Plants, M.D., Ph.D. 12/16/2017  [place], [place]

## 2017-12-18 ENCOUNTER — Telehealth: Payer: Self-pay | Admitting: *Deleted

## 2017-12-18 NOTE — Telephone Encounter (Signed)
The patient is aware of her echo results and is agreeable with cancelling her appointment with Dr. Rockey Situ tomorrow.

## 2017-12-18 NOTE — Telephone Encounter (Signed)
I attempted to call the patient regarding her echo results and to cancel her appointment with Dr. Rockey Situ on 3/19. Her voice mail box is full- will call back.

## 2017-12-18 NOTE — Telephone Encounter (Signed)
-----   Message from Minna Merritts, MD sent at 12/16/2017  8:19 PM EDT ----- Regarding: cancel Appt on tuesday  Recent admission to the hospital for pneumonia hospitalist (mody) recommended she follow up in cardiology clinic for the results of her echo (was not ready at the time of d/c)  Echo is normal., EF 65%, no other abn  She does not need a new patient visit on Tuesday to go over the results. She can follow up with PMD TG

## 2017-12-19 ENCOUNTER — Telehealth: Payer: Self-pay | Admitting: Licensed Clinical Social Worker

## 2017-12-19 ENCOUNTER — Ambulatory Visit: Payer: Medicaid Other | Admitting: Cardiovascular Disease

## 2017-12-19 NOTE — Telephone Encounter (Signed)
CSW contacted patient due to a response she gave when [facility] phone service called her. Patient had stated she had "lost interest in things." CSW contacted patient who stated that she is doing fine and that she had answered that way due to having some issues regarding getting her disability transferred to the state of [place]. She stated that she is hoping it all works out. CSW provided supportive counseling and offered to provide her outpatient resources for mental health but she declined.  Shela Leff MSW,LCSW [phone]

## 2017-12-24 IMAGING — CR DG ABDOMEN 1V
1 series · 2 of 2 positions shown · non-contrast
Comparison: CT abdomen pelvis dated 10/25/2015.

CLINICAL DATA: Constipation x2 weeks.  Nausea/vomiting.

EXAM:
ABDOMEN - 1 VIEW

[Series 2: abdomen kub · 0.14mm/px · 2 of 2 slices shown]
[im 1/2]
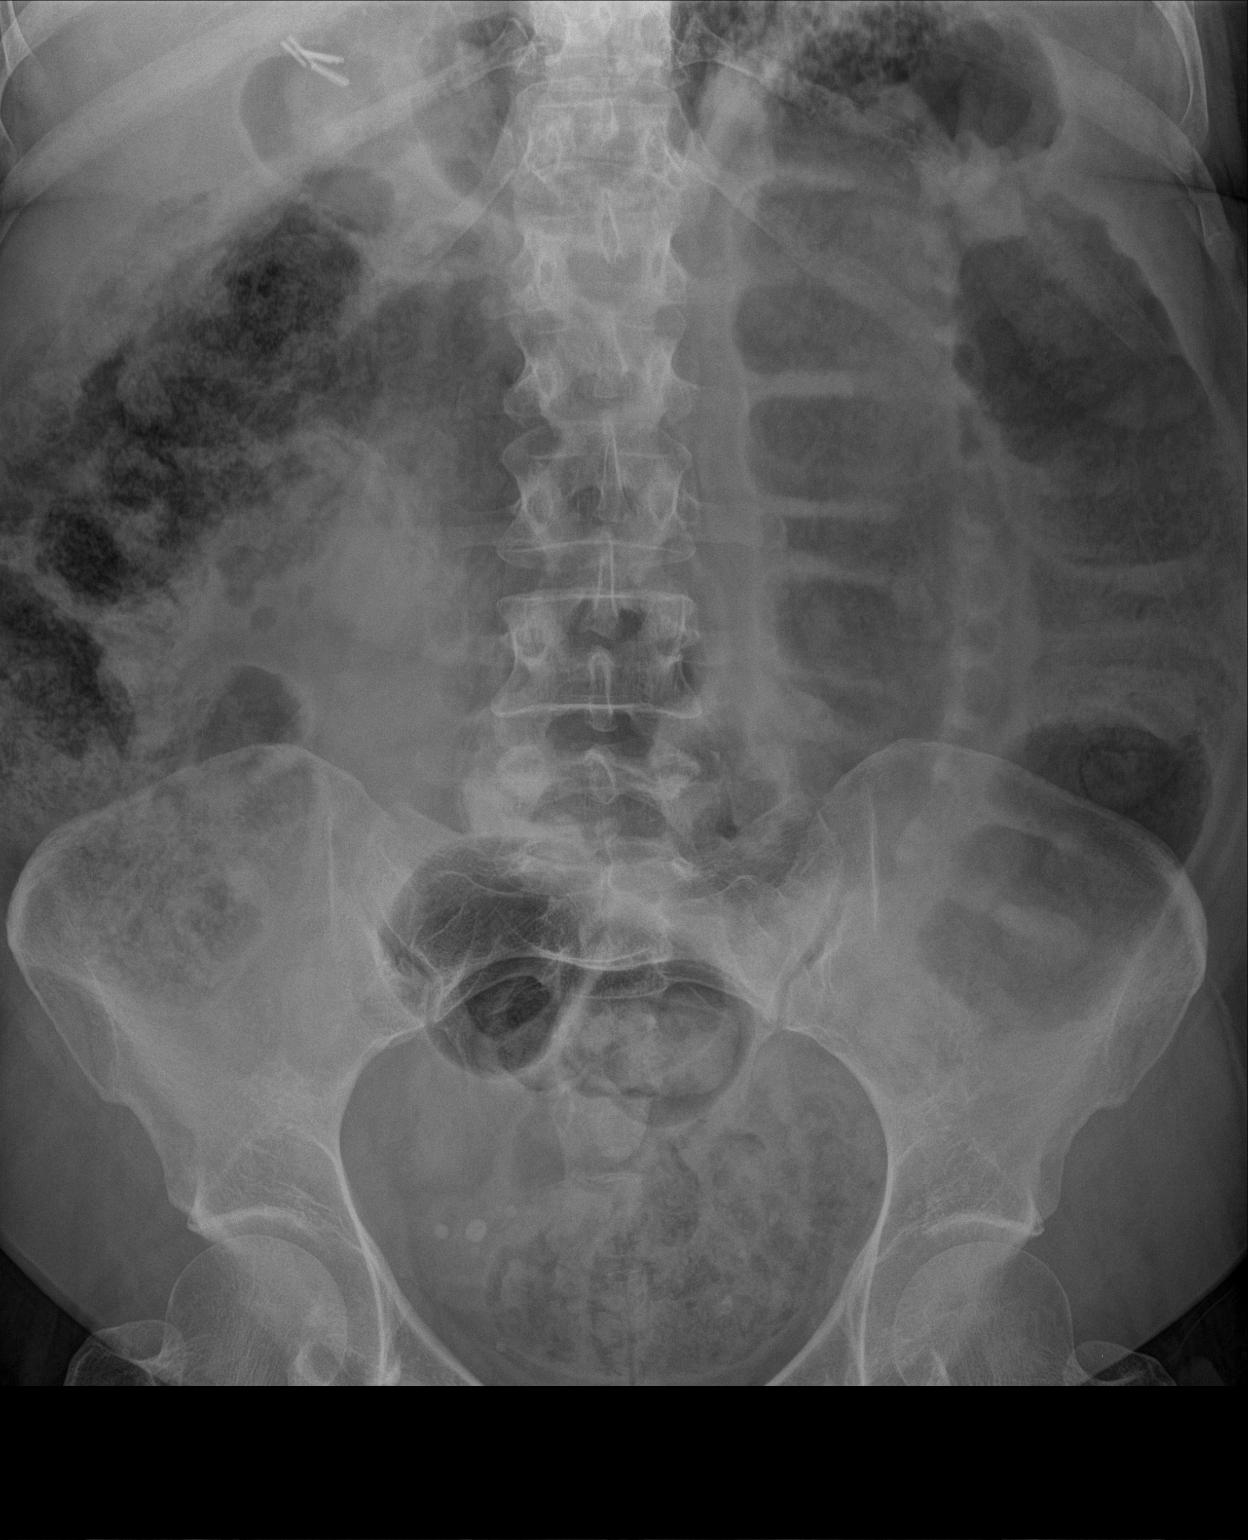
[im 2/2]
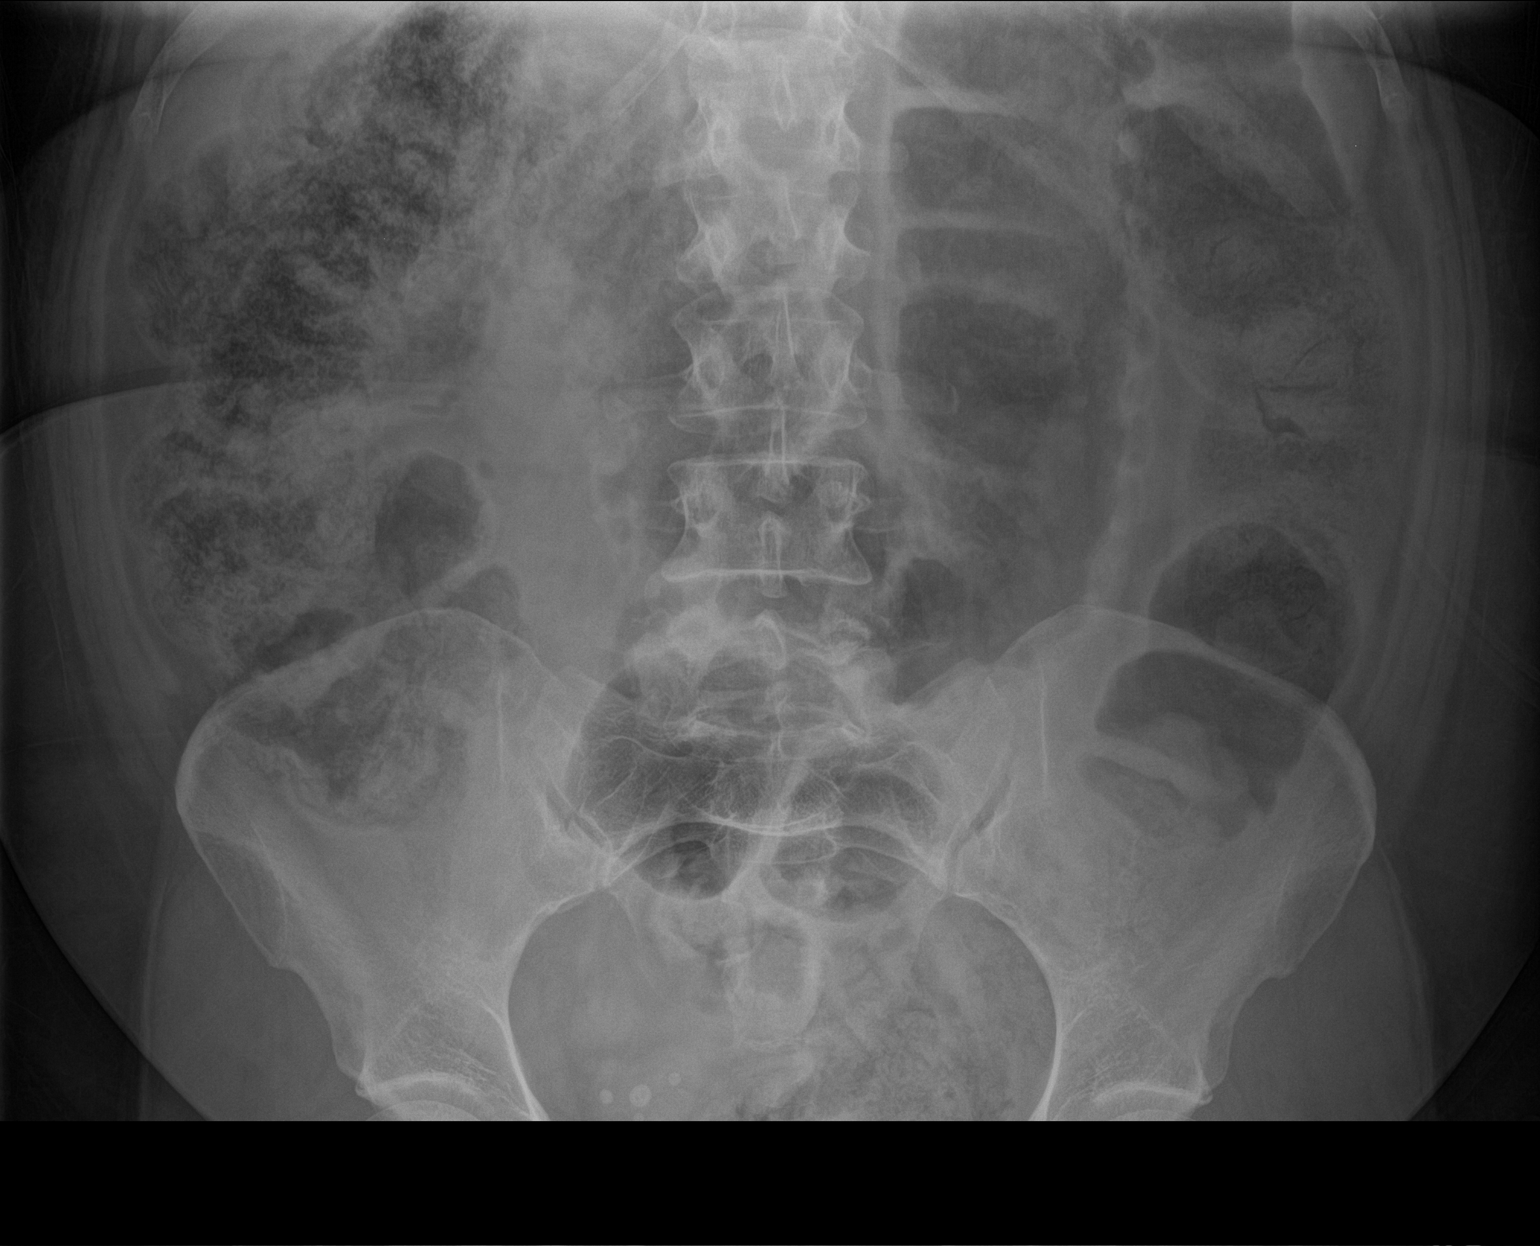

[2 of 2 positions shown; findings below may reference images not displayed]

FINDINGS: Nonobstructive bowel gas pattern.

Moderate colonic stool burden.

Cholecystectomy clips.

Visualized osseous structures are within normal limits.
IMPRESSION: No evidence of bowel obstruction.

Moderate colonic stool burden, suggesting constipation.

## 2018-01-23 ENCOUNTER — Telehealth: Payer: Self-pay | Admitting: *Deleted

## 2018-01-23 NOTE — Telephone Encounter (Signed)
Patient called and wanted to know what kind of mesh was placed to repair her hernia, she was told it was Ventral ex ST Patch

## 2018-01-24 ENCOUNTER — Encounter

## 2018-01-24 ENCOUNTER — Ambulatory Visit: Payer: Medicaid Other | Admitting: Internal Medicine

## 2018-03-03 DEATH — deceased
